# Patient Record
Sex: Female | Born: 1985 | Race: White | Hispanic: No | Smoking: Former smoker
Health system: Southern US, Community
[De-identification: ages and names within clinical notes are randomized; demographics above are authoritative.]

## PROBLEM LIST (undated history)

## (undated) DIAGNOSIS — I7300 Raynaud's syndrome without gangrene: Secondary | ICD-10-CM

## (undated) DIAGNOSIS — F329 Major depressive disorder, single episode, unspecified: Secondary | ICD-10-CM

## (undated) DIAGNOSIS — G43909 Migraine, unspecified, not intractable, without status migrainosus: Secondary | ICD-10-CM

## (undated) DIAGNOSIS — G5600 Carpal tunnel syndrome, unspecified upper limb: Secondary | ICD-10-CM

## (undated) DIAGNOSIS — M3500 Sicca syndrome, unspecified: Secondary | ICD-10-CM

## (undated) DIAGNOSIS — E063 Autoimmune thyroiditis: Secondary | ICD-10-CM

## (undated) DIAGNOSIS — A64 Unspecified sexually transmitted disease: Secondary | ICD-10-CM

## (undated) DIAGNOSIS — I890 Lymphedema, not elsewhere classified: Secondary | ICD-10-CM

## (undated) DIAGNOSIS — F419 Anxiety disorder, unspecified: Secondary | ICD-10-CM

## (undated) DIAGNOSIS — M4120 Other idiopathic scoliosis, site unspecified: Secondary | ICD-10-CM

## (undated) DIAGNOSIS — G4700 Insomnia, unspecified: Secondary | ICD-10-CM

## (undated) HISTORY — DX: Autoimmune thyroiditis: E06.3

## (undated) HISTORY — DX: Sjogren syndrome, unspecified: M35.00

## (undated) HISTORY — PX: CARPAL TUNNEL RELEASE: SHX101

## (undated) HISTORY — DX: Insomnia, unspecified: G47.00

## (undated) HISTORY — DX: Carpal tunnel syndrome, unspecified upper limb: G56.00

## (undated) HISTORY — DX: Other idiopathic scoliosis, site unspecified: M41.20

## (undated) HISTORY — DX: Lymphedema, not elsewhere classified: I89.0

## (undated) HISTORY — DX: Unspecified sexually transmitted disease: A64

## (undated) HISTORY — DX: Anxiety disorder, unspecified: F41.9

## (undated) HISTORY — DX: Raynaud's syndrome without gangrene: I73.00

## (undated) HISTORY — DX: Major depressive disorder, single episode, unspecified: F32.9

## (undated) HISTORY — DX: Migraine, unspecified, not intractable, without status migrainosus: G43.909

---
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
## 2007-01-25 ENCOUNTER — Ambulatory Visit: Payer: Self-pay | Admitting: Internal Medicine

## 2007-02-11 ENCOUNTER — Ambulatory Visit: Payer: Self-pay | Admitting: Internal Medicine

## 2008-03-25 ENCOUNTER — Other Ambulatory Visit: Admission: RE | Admit: 2008-03-25 | Discharge: 2008-03-25 | Payer: Self-pay | Admitting: Obstetrics and Gynecology

## 2008-06-10 ENCOUNTER — Ambulatory Visit: Payer: Self-pay | Admitting: Family Medicine

## 2008-06-10 DIAGNOSIS — F3289 Other specified depressive episodes: Secondary | ICD-10-CM

## 2008-06-10 DIAGNOSIS — G4700 Insomnia, unspecified: Secondary | ICD-10-CM

## 2008-06-10 DIAGNOSIS — F329 Major depressive disorder, single episode, unspecified: Secondary | ICD-10-CM

## 2008-06-10 DIAGNOSIS — N3000 Acute cystitis without hematuria: Secondary | ICD-10-CM | POA: Insufficient documentation

## 2008-06-10 DIAGNOSIS — R000 Tachycardia, unspecified: Secondary | ICD-10-CM

## 2008-06-10 DIAGNOSIS — M4120 Other idiopathic scoliosis, site unspecified: Secondary | ICD-10-CM | POA: Insufficient documentation

## 2008-06-10 DIAGNOSIS — R51 Headache: Secondary | ICD-10-CM

## 2008-06-10 DIAGNOSIS — R519 Headache, unspecified: Secondary | ICD-10-CM | POA: Insufficient documentation

## 2008-06-10 DIAGNOSIS — N3001 Acute cystitis with hematuria: Secondary | ICD-10-CM | POA: Insufficient documentation

## 2008-06-10 HISTORY — DX: Major depressive disorder, single episode, unspecified: F32.9

## 2008-06-10 HISTORY — DX: Other idiopathic scoliosis, site unspecified: M41.20

## 2008-06-10 HISTORY — DX: Insomnia, unspecified: G47.00

## 2008-06-10 HISTORY — DX: Other specified depressive episodes: F32.89

## 2008-06-10 LAB — CONVERTED CEMR LAB
Bilirubin Urine: NEGATIVE
Blood in Urine, dipstick: NEGATIVE
Glucose, Urine, Semiquant: NEGATIVE
Ketones, urine, test strip: NEGATIVE
Nitrite: NEGATIVE
Protein, U semiquant: NEGATIVE
Specific Gravity, Urine: >=1.03
Urobilinogen, UA: 0.2
WBC Urine, dipstick: NEGATIVE
pH: 6

## 2008-06-12 LAB — CONVERTED CEMR LAB
BUN: 10 mg/dL (ref 6–23)
Basophils Absolute: 0.1 10*3/uL (ref 0.0–0.1)
Basophils Relative: 0.8 % (ref 0.0–3.0)
CO2: 28 meq/L (ref 19–32)
Calcium: 9.6 mg/dL (ref 8.4–10.5)
Chloride: 110 meq/L (ref 96–112)
Creatinine, Ser: 0.7 mg/dL (ref 0.4–1.2)
Eosinophils Absolute: 0 10*3/uL (ref 0.0–0.7)
Eosinophils Relative: 0.6 % (ref 0.0–5.0)
GFR calc Af Amer: 137 mL/min
GFR calc non Af Amer: 113 mL/min
Glucose, Bld: 129 mg/dL — ABNORMAL HIGH (ref 70–99)
HCT: 38.1 % (ref 36.0–46.0)
Hemoglobin: 12.9 g/dL (ref 12.0–15.0)
Lymphocytes Relative: 40.5 % (ref 12.0–46.0)
MCHC: 33.8 g/dL (ref 30.0–36.0)
MCV: 90.9 fL (ref 78.0–100.0)
Monocytes Absolute: 0.5 10*3/uL (ref 0.1–1.0)
Monocytes Relative: 8.4 % (ref 3.0–12.0)
Neutro Abs: 3.2 10*3/uL (ref 1.4–7.7)
Neutrophils Relative %: 49.7 % (ref 43.0–77.0)
Platelets: 257 10*3/uL (ref 150–400)
Potassium: 4.4 meq/L (ref 3.5–5.1)
RBC: 4.19 M/uL (ref 3.87–5.11)
RDW: 14 % (ref 11.5–14.6)
Sodium: 144 meq/L (ref 135–145)
TSH: 0.59 microintl units/mL (ref 0.35–5.50)
WBC: 6.4 10*3/uL (ref 4.5–10.5)

## 2008-08-08 ENCOUNTER — Emergency Department: Payer: Self-pay | Admitting: Unknown Physician Specialty

## 2009-05-21 ENCOUNTER — Emergency Department: Payer: Self-pay | Admitting: Emergency Medicine

## 2009-09-15 ENCOUNTER — Observation Stay: Payer: Self-pay

## 2009-09-23 ENCOUNTER — Inpatient Hospital Stay: Payer: Self-pay | Admitting: Obstetrics & Gynecology

## 2009-11-05 HISTORY — PX: INTRAUTERINE DEVICE INSERTION: SHX323

## 2010-11-16 ENCOUNTER — Emergency Department (HOSPITAL_COMMUNITY)
Admission: EM | Admit: 2010-11-16 | Discharge: 2010-11-16 | Disposition: A | Discharge status: Home / Self Care | Payer: 59 | Attending: Emergency Medicine | Admitting: Emergency Medicine

## 2010-11-16 DIAGNOSIS — K5289 Other specified noninfective gastroenteritis and colitis: Secondary | ICD-10-CM | POA: Insufficient documentation

## 2010-11-16 DIAGNOSIS — R109 Unspecified abdominal pain: Secondary | ICD-10-CM | POA: Insufficient documentation

## 2010-11-16 DIAGNOSIS — F329 Major depressive disorder, single episode, unspecified: Secondary | ICD-10-CM | POA: Insufficient documentation

## 2010-11-16 DIAGNOSIS — F3289 Other specified depressive episodes: Secondary | ICD-10-CM | POA: Insufficient documentation

## 2010-11-16 DIAGNOSIS — R6883 Chills (without fever): Secondary | ICD-10-CM | POA: Insufficient documentation

## 2010-11-16 DIAGNOSIS — R112 Nausea with vomiting, unspecified: Secondary | ICD-10-CM | POA: Insufficient documentation

## 2010-11-16 DIAGNOSIS — R197 Diarrhea, unspecified: Secondary | ICD-10-CM | POA: Insufficient documentation

## 2010-11-28 ENCOUNTER — Encounter: Payer: Self-pay | Admitting: Family Medicine

## 2010-11-28 ENCOUNTER — Ambulatory Visit (INDEPENDENT_AMBULATORY_CARE_PROVIDER_SITE_OTHER): Payer: 59 | Admitting: Family Medicine

## 2010-11-28 DIAGNOSIS — G43909 Migraine, unspecified, not intractable, without status migrainosus: Secondary | ICD-10-CM

## 2010-11-28 DIAGNOSIS — R000 Tachycardia, unspecified: Secondary | ICD-10-CM

## 2010-11-28 DIAGNOSIS — G44209 Tension-type headache, unspecified, not intractable: Secondary | ICD-10-CM

## 2010-11-28 MED ORDER — SUMATRIPTAN SUCCINATE 100 MG PO TABS: 100.0000 mg | ORAL_TABLET | ORAL | Status: DC | PRN

## 2010-11-28 MED ORDER — NORTRIPTYLINE HCL 25 MG PO CAPS: 25.0000 mg | ORAL_CAPSULE | Freq: Every day | ORAL | Status: DC

## 2010-11-28 NOTE — Patient Instructions (Signed)
Migraine Headache   A migraine headache is an intense, throbbing pain on one or both sides of your head. The exact cause of a migraine headache is not always known. A migraine may be caused when nerves in the brain become irritated and release chemicals that cause swelling (inflammation) within blood vessels, causing pain. Many migraine sufferers have a family history of migraines. Before you get a migraine you may or may not get an aura. An aura is a group of symptoms that can predict the beginning of a migraine. An aura may include:   Visual changes such as:   Flashing lights.   Seeing bright spots or zig-zag lines.   Tunnel vision.   Feelings of numbness.   Trouble talking.   Muscle weakness.   SYMPTOMS OF A MIGRAINE   A migraine headache has one or more of the following symptoms:   Pain on one or both sides of your head.   Pain that is pulsating or throbbing in nature.   Pain that is severe enough to prevent daily activities.   Pain that is aggravated by any daily physical activity.   Nausea (feeling sick to your stomach), vomiting or both.   Pain with exposure to bright lights, loud noises or activity.   General sensitivity to bright lights or loud noises.   MIGRAINE TRIGGERS   A migraine headache can be triggered by many things. Examples of triggers include:   Alcohol.   Smoking.   Stress.   It may be related to menses (female menstruation).   Aged cheeses.   Foods or drinks that contain nitrates, glutamate, aspartame or tyramine.   Lack of sleep.   Chocolate.   Caffeine.   Hunger.   Medications such as nitroglycerine (used to treat chest pain), birth control pills, estrogen and some blood pressure medications.   DIAGNOSIS   A migraine headache is often diagnosed based on:   Your symptoms.   Physical examination.   A CT scan of your head may be ordered to see if your headaches are caused from other medical conditions.   HOME CARE INSTRUCTIONS   Medications can help prevent migraines if they are recurrent or  should they become recurrent. Your caregiver can help you with a medication or treatment program that will be helpful to you.   If you get a migraine, it may be helpful to lie down in a dark, quiet room.   It may be helpful to keep a headache diary. This may help you find a trend as to what may be triggering your headaches.   SEEK IMMEDIATE MEDICAL CARE IF:   You do not get relief from the medications given to you or you have a recurrence of pain.   You have confusion, personality changes or seizures.   You have headaches that wake you from sleep.   You have an increased frequency in your headaches.   You have a stiff neck.   You have a loss of vision.   You have muscle weakness.   You start losing your balance or have trouble walking.   You feel faint or pass out.   MAKE SURE YOU:   Understand these instructions.   Will watch your condition.   Will get help right away if you are not doing well or get worse.   Document Released: 09/11/2005 Document Re-Released: 07/09/2009   [employer]® Patient Information ©2011 [employer].

## 2010-11-28 NOTE — Progress Notes (Signed)
  Subjective:    Patient ID: Nichole Mcbride, female    DOB: Dec 02, 1985, 25 y.o.   MRN: [id number]  HPI Patient seen with chief complaint of headaches. She has history of both migraines which have become more frequent recently and daily type headache which she attributes to tension headache. Long history of migraines. Her migraines are usually unilateral , throbbing associated with nausea and vomiting and occur about once per week. Her daily headaches which she's had over the past month and are usually bilateral and bandlike with associated tension in the neck and upper back.   Works third shift. 31-month-old son so she gets very little sleep, usually 3 hours per night. No significant caffeine use. Denies any recent fever , sinusitis symptoms , focal weakness , or any other focal neurologic concerns.   Review of Systems  Constitutional: Negative for fever, chills, appetite change and unexpected weight change.  HENT: Negative for nosebleeds, sore throat, rhinorrhea and sinus pressure.   Eyes: Negative for visual disturbance.  Respiratory: Negative for cough.   Cardiovascular: Negative for chest pain.  Skin: Negative for rash.  Neurological: Positive for headaches. Negative for seizures and syncope.  Hematological: Negative for adenopathy.  Psychiatric/Behavioral: Negative for confusion.       Objective:   Physical Exam  patient is alert nontoxic in appearance. Nasal exam is unremarkable Eardrums normal Oropharynx is clear Neck supple no adenopathy or thyromegaly Chest clear to auscultation Heart regular rhythm and rate no murmur Neuro exam cranial nerves II through XII are normal. Strength is full throughout. Normal cerebellar function.       Assessment & Plan:   #1 migraine headaches with increasing frequency probably related to poor sleep  #2 chronic daily tension-type headaches again probably related to poor sleep and increased stress   discuss acute headache treatment as well  as prevention. Work on more consistent sleep-difficult with her life stresses. Imitrex 100 mg when necessary for migraine headache. Given frequency of migraine headaches and new onset of daily tension-type headaches initiate low-dose Pamelor 25 mg each bedtime and reviewed possible side effects. followup with primary in one month to reassess.  Consider other possible migraine preventive meds such as Topamax but she has mixed migraine and tension type.

## 2010-12-11 ENCOUNTER — Emergency Department (HOSPITAL_COMMUNITY)
Admission: EM | Admit: 2010-12-11 | Discharge: 2010-12-11 | Disposition: A | Discharge status: Home / Self Care | Payer: 59 | Attending: Emergency Medicine | Admitting: Emergency Medicine

## 2010-12-11 DIAGNOSIS — F3289 Other specified depressive episodes: Secondary | ICD-10-CM | POA: Insufficient documentation

## 2010-12-11 DIAGNOSIS — R197 Diarrhea, unspecified: Secondary | ICD-10-CM | POA: Insufficient documentation

## 2010-12-11 DIAGNOSIS — F329 Major depressive disorder, single episode, unspecified: Secondary | ICD-10-CM | POA: Insufficient documentation

## 2010-12-11 DIAGNOSIS — R5381 Other malaise: Secondary | ICD-10-CM | POA: Insufficient documentation

## 2010-12-11 DIAGNOSIS — R42 Dizziness and giddiness: Secondary | ICD-10-CM | POA: Insufficient documentation

## 2010-12-11 DIAGNOSIS — R112 Nausea with vomiting, unspecified: Secondary | ICD-10-CM | POA: Insufficient documentation

## 2010-12-11 LAB — CBC
HCT: 40.3 % (ref 36.0–46.0)
Hemoglobin: 13.3 g/dL (ref 12.0–15.0)
MCH: 30.2 pg (ref 26.0–34.0)
MCHC: 33 g/dL (ref 30.0–36.0)
MCV: 91.6 fL (ref 78.0–100.0)
RBC: 4.4 MIL/uL (ref 3.87–5.11)

## 2010-12-11 LAB — COMPREHENSIVE METABOLIC PANEL
Alkaline Phosphatase: 60 U/L (ref 39–117)
BUN: 15 mg/dL (ref 6–23)
Calcium: 9 mg/dL (ref 8.4–10.5)
Creatinine, Ser: 0.75 mg/dL (ref 0.4–1.2)
Glucose, Bld: 120 mg/dL — ABNORMAL HIGH (ref 70–99)
Potassium: 5 mEq/L (ref 3.5–5.1)
Total Protein: 7.3 g/dL (ref 6.0–8.3)

## 2010-12-11 LAB — URINALYSIS, ROUTINE W REFLEX MICROSCOPIC
Bilirubin Urine: NEGATIVE
Ketones, ur: 40 mg/dL — AB
Nitrite: NEGATIVE
Protein, ur: NEGATIVE mg/dL
Urobilinogen, UA: 0.2 mg/dL (ref 0.0–1.0)

## 2010-12-11 LAB — DIFFERENTIAL
Basophils Relative: 0 % (ref 0–1)
Lymphs Abs: 1 10*3/uL (ref 0.7–4.0)
Monocytes Absolute: 0.5 10*3/uL (ref 0.1–1.0)
Monocytes Relative: 3 % (ref 3–12)
Neutro Abs: 14.9 10*3/uL — ABNORMAL HIGH (ref 1.7–7.7)
Neutrophils Relative %: 91 % — ABNORMAL HIGH (ref 43–77)

## 2010-12-11 LAB — LIPASE, BLOOD: Lipase: 19 U/L (ref 11–59)

## 2010-12-11 LAB — PREGNANCY, URINE: Preg Test, Ur: NEGATIVE

## 2010-12-12 LAB — GLUCOSE, CAPILLARY

## 2011-01-02 ENCOUNTER — Ambulatory Visit: Payer: 59 | Admitting: Family Medicine

## 2011-09-20 ENCOUNTER — Encounter: Payer: Self-pay | Admitting: Family Medicine

## 2011-09-20 ENCOUNTER — Ambulatory Visit (INDEPENDENT_AMBULATORY_CARE_PROVIDER_SITE_OTHER): Payer: 59 | Admitting: Family Medicine

## 2011-09-20 VITALS — BP 110/70 | HR 90 | Temp 98.5°F | Ht 65.0 in | Wt 133.4 lb

## 2011-09-20 DIAGNOSIS — J0301 Acute recurrent streptococcal tonsillitis: Secondary | ICD-10-CM

## 2011-09-20 DIAGNOSIS — I7300 Raynaud's syndrome without gangrene: Secondary | ICD-10-CM

## 2011-09-20 DIAGNOSIS — J020 Streptococcal pharyngitis: Secondary | ICD-10-CM

## 2011-09-20 DIAGNOSIS — R5383 Other fatigue: Secondary | ICD-10-CM

## 2011-09-20 DIAGNOSIS — R5381 Other malaise: Secondary | ICD-10-CM

## 2011-09-20 DIAGNOSIS — T148XXA Other injury of unspecified body region, initial encounter: Secondary | ICD-10-CM

## 2011-09-20 DIAGNOSIS — G43009 Migraine without aura, not intractable, without status migrainosus: Secondary | ICD-10-CM

## 2011-09-20 MED ORDER — PROPRANOLOL HCL ER 80 MG PO CP24: 80.0000 mg | ORAL_CAPSULE | Freq: Every day | ORAL | Status: DC

## 2011-09-20 NOTE — Progress Notes (Signed)
Patient Name: Nichole Mcbride Date of Birth: 08-02-1986 Age: 25 y.o. Medical Record Number: [id number] Gender: female Date of Encounter: 09/20/2011  History of Present Illness:  Nichole Mcbride is a 25 y.o. very pleasant female patient who presents with the following:  Wanted a new MD closer to home.   Headaches -- intermittent migraine headaches. She has been having these for more than 10 years. She has been seeing the headache clinic in the past. She's been tried on a variety of medications and none of these have significantly helped. Bad reaction to imitrex.  Does not eat fruit. Did not eat any meat until pregnancy with son.  Eats bread and cheese.  She has done HA diaries in the past and tried to eliminate multiple things in her diet  Was going to headache / [facility] center. Was on one medication, not sure what it was. Tried eliminating things from diet.   gets strep throat --- has been getting strep throat every 1-2 months Per report and her boyfriend's report, the patient has been having acute streptococcal pharyngitis at least once a month or every one to 2 months over the last several years. These are all strep test proven, and she has been treated with penicillin or amoxicillin.  Lately has been getting a lot more numbness in hands, but not turning white.   Always feels tired.  Headaces have gotten worse over the last year.   A couple weeks back -- ? Lupus and thyroid.  She does have mmultiple joint complaints. Her mother does have lupus, and she does have Raynaud's phenomenon. Her knowledge, she has never had a full rheumatological workup.  Bruises easily  Past Medical History, Surgical History, Social History, Family History, Problem List, Medications, and Allergies have been reviewed and updated if relevant.  Review of Systems: Multiple complaints as above. Recent strep throat. Joint aches. Fatigue. Bruising. Headaches. No nausea, vomiting, or diarrhea. The patient sleeps  approximately 3-5 hours a night, and works on 3rd shift. Denies depression. Otherwise, the pertinent positives and negatives are listed above and in the HPI, otherwise a full review of systems has been reviewed and is negative unless noted positive.   Physical Examination: Filed Vitals:   09/20/11 1353  BP: 110/70  Pulse: 90  Temp: 98.5 F (36.9 C)  TempSrc: Oral  Height: 5\' 5"  (1.651 m)  Weight: 133 lb 6.4 oz (60.51 kg)  SpO2: 100%    Body mass index is 22.20 kg/(m^2).   GEN: WDWN, NAD, Non-toxic, A & O x 3 HEENT: Atraumatic, Normocephalic. Neck supple. No masses, No LAD. Ears and Nose: No external deformity. CV: RRR, No M/G/R. No JVD. No thrill. No extra heart sounds. PULM: CTA B, no wheezes, crackles, rhonchi. No retractions. No resp. distress. No accessory muscle use. EXTR: No c/c/e MSK: full range of motion in all joints. No dramatic tenosynovitis. NEURO Normal gait.  PSYCH: Normally interactive. Conversant. Not depressed or anxious appearing.  Calm demeanor.    Assessment and Plan: 1. Acute recurrent streptococcal tonsillitis  Ambulatory referral to ENT, Anti-Smith antibody, Cyclic citrul peptide antibody, IgG  2. Raynaud's disease  Basic metabolic panel, CBC with Differential, High sensitivity CRP, Sedimentation rate, Rheumatoid factor, ANA, TSH, Vitamin B12, Anti-DNA antibody, double-stranded, Extractable Nuclear antigen ab, Anti-Smith antibody, Cyclic citrul peptide antibody, IgG  3. Fatigue  Basic metabolic panel, CBC with Differential, High sensitivity CRP, Sedimentation rate, Rheumatoid factor, ANA, TSH, Vitamin B12, Anti-DNA antibody, double-stranded, Extractable Nuclear antigen ab, Anti-Smith antibody, Cyclic citrul  peptide antibody, IgG  4. Bruising  Basic metabolic panel, CBC with Differential, High sensitivity CRP, Sedimentation rate, Rheumatoid factor, ANA, TSH, Vitamin B12, Anti-DNA antibody, double-stranded, Extractable Nuclear antigen ab, Anti-Smith antibody,  Cyclic citrul peptide antibody, IgG  5. Migraine headache without aura  propranolol (INDERAL LA) 80 MG 24 hr capsule, Anti-Smith antibody, Cyclic citrul peptide antibody, IgG    Recurrent streptococcal infections, anywhere from 6-12 times a year. Consult ENT. Clinical question is whether or not tonsillectomy as appropriate.  Multiple joint complaints in the setting of Raynaud's disease and a family history of lupus. Check full rheumatological workup as above.  Migraine headaches, recalcitrant. Initiate beta blockade, and recheck in one month.  >40 minutes spent in face to face time with patient, >50% spent in counselling or coordination of care: much of time spent discussing management of recurrent streptococcal infections, consideration of ENT consult, migraine headache management, history, eating, and also a discussion regarding rheumatological diseases in general and discussion of Raynaud's disease as well as lupus.

## 2011-09-20 NOTE — Patient Instructions (Signed)
F/u 4-5 weeks 

## 2011-09-21 LAB — CBC WITH DIFFERENTIAL/PLATELET
Basos: 1 % (ref 0–3)
Eos: 1 % (ref 0–7)
HCT: 43.2 % (ref 34.0–46.6)
Immature Grans (Abs): 0 10*3/uL (ref 0.0–0.1)
Immature Granulocytes: 0 % (ref 0–2)
Lymphocytes Absolute: 2.5 10*3/uL (ref 0.7–4.5)
MCV: 91 fL (ref 79–97)
Monocytes Absolute: 0.5 10*3/uL (ref 0.1–1.0)
Monocytes: 8 % (ref 4–13)
RBC: 4.73 x10E6/uL (ref 3.77–5.28)
RDW: 12.8 % (ref 12.3–15.4)
WBC: 6.4 10*3/uL (ref 4.0–10.5)

## 2011-09-21 LAB — SEDIMENTATION RATE: Sed Rate: 2 mm/hr (ref 0–32)

## 2011-09-21 LAB — BASIC METABOLIC PANEL
BUN: 11 mg/dL (ref 6–20)
Calcium: 10.2 mg/dL (ref 8.7–10.2)
Creatinine, Ser: 0.72 mg/dL (ref 0.57–1.00)
GFR calc Af Amer: 137 mL/min/{1.73_m2} (ref >59)
GFR calc non Af Amer: 118 mL/min/{1.73_m2} (ref >59)
Glucose: 89 mg/dL (ref 65–99)
Potassium: 4.4 mmol/L (ref 3.5–5.2)

## 2011-09-21 LAB — RHEUMATOID FACTOR: Rhuematoid fact SerPl-aCnc: 7.9 IU/mL (ref 0.0–13.9)

## 2011-09-21 LAB — VITAMIN B12: Vitamin B-12: 248 pg/mL (ref 211–946)

## 2011-09-21 LAB — ANA: Anti Nuclear Antibody(ANA): POSITIVE — AB

## 2011-09-25 ENCOUNTER — Encounter: Payer: Self-pay | Admitting: Family Medicine

## 2011-09-29 ENCOUNTER — Telehealth: Payer: Self-pay | Admitting: Family Medicine

## 2011-09-29 NOTE — Telephone Encounter (Signed)
Dr. Patsy Lager to handle.

## 2011-09-29 NOTE — Telephone Encounter (Signed)
Requesting lab results.  Please call patient with results.

## 2011-09-29 NOTE — Telephone Encounter (Signed)
Called and left her a message.  ANA is positive - all others neg. Smith AB still pending.  Sophisticated patient who works at [employer] - I will detail with her at her follow-up and will want to do more specific lupus testing.

## 2011-10-02 ENCOUNTER — Telehealth: Payer: Self-pay | Admitting: *Deleted

## 2011-10-02 NOTE — Telephone Encounter (Signed)
Advised patient of results she wants to come in next week b/c she had to stop taken the inderal

## 2011-10-02 NOTE — Telephone Encounter (Signed)
Patient called stating that she left messages on Friday requesting her lab results and did not hear back from anyone regarding this.  It appears that patient's telephone number was incorrect on the chart. Please call patient back at [phone].

## 2011-10-02 NOTE — Telephone Encounter (Signed)
Please see lab note from Dr. Patsy Lager.. Call pt  to let pt know his comments.

## 2011-10-11 ENCOUNTER — Ambulatory Visit (INDEPENDENT_AMBULATORY_CARE_PROVIDER_SITE_OTHER): Payer: 59 | Admitting: Family Medicine

## 2011-10-11 ENCOUNTER — Encounter: Payer: Self-pay | Admitting: Family Medicine

## 2011-10-11 VITALS — BP 100/60 | HR 74 | Temp 98.6°F | Ht 65.0 in | Wt 133.8 lb

## 2011-10-11 DIAGNOSIS — I7300 Raynaud's syndrome without gangrene: Secondary | ICD-10-CM

## 2011-10-11 DIAGNOSIS — G43909 Migraine, unspecified, not intractable, without status migrainosus: Secondary | ICD-10-CM

## 2011-10-11 DIAGNOSIS — Z8269 Family history of other diseases of the musculoskeletal system and connective tissue: Secondary | ICD-10-CM

## 2011-10-11 DIAGNOSIS — Z840 Family history of diseases of the skin and subcutaneous tissue: Secondary | ICD-10-CM

## 2011-10-11 DIAGNOSIS — R768 Other specified abnormal immunological findings in serum: Secondary | ICD-10-CM

## 2011-10-11 DIAGNOSIS — R894 Abnormal immunological findings in specimens from other organs, systems and tissues: Secondary | ICD-10-CM

## 2011-10-11 MED ORDER — AMITRIPTYLINE HCL 25 MG PO TABS: 25.0000 mg | ORAL_TABLET | Freq: Every day | ORAL | Status: DC

## 2011-10-11 NOTE — Patient Instructions (Signed)
1 tablet before bed, about 20-30 mins. After 1 month, increase to 2 tablets at bed.  Recheck 2 months  REFERRAL: GO THE THE FRONT ROOM AT THE ENTRANCE OF OUR CLINIC, NEAR CHECK IN. ASK FOR MARION. SHE WILL HELP YOU SET UP YOUR REFERRAL. DATE: TIME:

## 2011-10-11 NOTE — Progress Notes (Signed)
  Patient Name: Nichole Mcbride Date of Birth: 1986-08-28 Medical Record Number: [id number] Gender: female Date of Encounter: 10/11/2011  History of Present Illness:  Nichole Mcbride is a 26 y.o. very pleasant female patient who presents with the following:  Migraine headache, follow. Unable to tolerate propranolol. She had stopped this after 2-3 days. She has been unable to tolerate the trip in class of medications. She has had some headaches approximately 2 times a week in the interval time.  Sleep is still with difficulty. She does have a 62-year-old child.  All laboratories were reviewed with the patient. The patient did have a positive ANA. She does have Raynaud's disease. Sedimentation rate and CRP are negative.  Rheumatoid factor is negative. Anti-Smith antibodies are negative. ENA is negative.  Patient Active Problem List  Diagnoses  . DEPRESSION  . SCOLIOSIS  . Raynaud's disease  . Migraine headache   Past Medical History  Diagnosis Date  . DEPRESSION 06/10/2008  . INSOMNIA 06/10/2008  . SCOLIOSIS 06/10/2008  . Migraine headache   . Raynaud phenomenon    No past surgical history on file. History  Substance Use Topics  . Smoking status: Never Smoker   . Smokeless tobacco: Not on file  . Alcohol Use: Not on file   No family history on file. Allergies  Allergen Reactions  . Imitrex (Sumatriptan Succinate)     Medication list has been reviewed and updated.  Review of Systems: Continued headaches. No rashes. Occasional arthralgias persist. Some difficulty sleeping. History of depression, but not active.  Physical Examination: Filed Vitals:   10/11/11 0944  BP: 100/60  Pulse: 74  Temp: 98.6 F (37 C)  TempSrc: Oral  Height: 5\' 5"  (1.651 m)  Weight: 133 lb 12.8 oz (60.691 kg)  SpO2: 99%    Body mass index is 22.27 kg/(m^2).   GEN: WDWN, NAD, Non-toxic, A & O x 3 HEENT: Atraumatic, Normocephalic. Neck supple. No masses, No LAD. Ears and Nose: No external  deformity. CV: RRR, No M/G/R. No JVD. No thrill. No extra heart sounds. PULM: CTA B, no wheezes, crackles, rhonchi. No retractions. No resp. distress. No accessory muscle use. EXTR: No c/c/e NEURO Normal gait.  PSYCH: Normally interactive. Conversant. Not depressed or anxious appearing.  Calm demeanor.    Assessment and Plan: 1. Migraine headache    2. Positive ANA (antinuclear antibody)  Ambulatory referral to Rheumatology  3. Raynaud disease  Ambulatory referral to Rheumatology  4. Family history of systemic lupus erythematosus  Ambulatory referral to Rheumatology    Migraines, start amitriptyline prior to bedtime.  Mother with systemic lupus, positive ANA. Unfortunately, the [facility] seems to have not done a titration for this ANA. With a positive family history in her mother with systemic lupus, Raynaud's disease, and a positive ANA, I think it is prudent to have a rheumatological evaluation to define yes / no if SLE or other connective tissue disease

## 2011-10-14 ENCOUNTER — Encounter: Payer: Self-pay | Admitting: Family Medicine

## 2011-10-14 DIAGNOSIS — G43909 Migraine, unspecified, not intractable, without status migrainosus: Secondary | ICD-10-CM | POA: Insufficient documentation

## 2016-03-23 ENCOUNTER — Ambulatory Visit (INDEPENDENT_AMBULATORY_CARE_PROVIDER_SITE_OTHER): Admitting: Primary Care

## 2016-03-23 ENCOUNTER — Encounter: Payer: Self-pay | Admitting: Primary Care

## 2016-03-23 VITALS — BP 116/68 | HR 74 | Temp 98.0°F | Ht 65.0 in | Wt 186.0 lb

## 2016-03-23 DIAGNOSIS — F418 Other specified anxiety disorders: Secondary | ICD-10-CM

## 2016-03-23 DIAGNOSIS — M3500 Sicca syndrome, unspecified: Secondary | ICD-10-CM

## 2016-03-23 DIAGNOSIS — F329 Major depressive disorder, single episode, unspecified: Secondary | ICD-10-CM

## 2016-03-23 DIAGNOSIS — M797 Fibromyalgia: Secondary | ICD-10-CM

## 2016-03-23 DIAGNOSIS — F419 Anxiety disorder, unspecified: Principal | ICD-10-CM

## 2016-03-23 DIAGNOSIS — G43901 Migraine, unspecified, not intractable, with status migrainosus: Secondary | ICD-10-CM

## 2016-03-23 DIAGNOSIS — F32A Depression, unspecified: Secondary | ICD-10-CM | POA: Insufficient documentation

## 2016-03-23 MED ORDER — DULOXETINE HCL 30 MG PO CPEP: 30.0000 mg | ORAL_CAPSULE | Freq: Every evening | ORAL | Status: DC

## 2016-03-23 MED ORDER — BUPROPION HCL 100 MG PO TABS: 100.0000 mg | ORAL_TABLET | Freq: Every day | ORAL | Status: DC

## 2016-03-23 NOTE — Progress Notes (Signed)
   Subjective:    Patient ID: Nichole Mcbride, female    DOB: 01/26/1986, 30 y.o.   MRN: [id number]  HPI  [name] is a 30 year old female who presents today to establish care and discuss the problems mentioned below. Will obtain old records.  1) Sjogrens/Fibromyalgia: Diagnosed 4-5 years ago. She currently follows with [facility]. She experiences discomfort to lower back, hands, hips, legs, photosensitivity, dry eyes. She's had a positive ANA, Sjogren's antibodies, and ESR. She is currently not on any medication and will take ibuprofen for her discomfort without improvement.   2) Anxiety and Depression: Diagnosed at age 19. She is currently managed on Wellbutrin 100 mg in the morning and Effexor XR 150 mg at bedtime. She recently visited with her rheumatologist who recommended she try Cymbalta for presumed fibromyalgia pain. She has a history of panic attacks which are infrequent. She is not followed by psychiatry. Denies SI/HI.  Review of Systems  Respiratory: Negative for shortness of breath.   Cardiovascular: Negative for chest pain.  Musculoskeletal: Positive for arthralgias. Negative for joint swelling.  Neurological: Negative for dizziness and headaches.  Psychiatric/Behavioral: Negative for suicidal ideas and sleep disturbance. The patient is not nervous/anxious.        Past Medical History  Diagnosis Date  . DEPRESSION 06/10/2008  . INSOMNIA 06/10/2008  . SCOLIOSIS 06/10/2008  . Migraine headache   . Raynaud phenomenon   . Sjogren's disease (HCC)      Social History   Social History  . Marital Status: Married    Spouse Name: N/A  . Number of Children: N/A  . Years of Education: N/A   Occupational History  . Not on file.   Social History Main Topics  . Smoking status: Never Smoker   . Smokeless tobacco: Not on file  . Alcohol Use: Not on file  . Drug Use: Not on file  . Sexual Activity: Not on file   Other Topics Concern  . Not on  file   Social History Narrative   Married.   2 children.   Teaches English.   Enjoys spending time with family.    No past surgical history on file.  Family History  Problem Relation Age of Onset  . Diabetes Mother   . Hypertension Mother   . Hypertension Father   . Hypothyroidism Father   . Hypothyroidism Mother     Allergies  Allergen Reactions  . Imitrex [Sumatriptan Succinate]     No current outpatient prescriptions on file prior to visit.   No current facility-administered medications on file prior to visit.    BP 116/68 mmHg  Pulse 74  Temp(Src) 98 F (36.7 C) (Oral)  Ht 5' 5" (1.651 m)  Wt 186 lb (84.369 kg)  BMI 30.95 kg/m2  SpO2 98%  LMP 03/18/2016 (Within Days)    Objective:   Physical Exam  Constitutional: She appears well-nourished.  Neck: Neck supple.  Cardiovascular: Normal rate and regular rhythm.   Pulmonary/Chest: Effort normal and breath sounds normal.  Musculoskeletal:  Good range of motion.  Skin: Skin is warm and dry.  Psychiatric: She has a normal mood and affect.          Assessment & Plan:   

## 2016-03-23 NOTE — Assessment & Plan Note (Signed)
Diagnosis suggested per rheumatology. Will switch Effexor to Cymbalta to aid with fiber myalgia discomfort.

## 2016-03-23 NOTE — Assessment & Plan Note (Signed)
Once managed on amitriptyline 25 mg at bedtime. Overall feels managed off medication at this time.

## 2016-03-23 NOTE — Patient Instructions (Signed)
Stop Effexor. Start Cymbalta. This is used for depression and fibromyalgia discomfort.  Continue Wellbutrin as prescribed.  Follow up in 6 weeks for complete physical and re-evaluation.  It was a pleasure to meet you today! Please don't hesitate to call me with any questions. Welcome to [facility]!    .Marland Kitchen

## 2016-03-23 NOTE — Progress Notes (Signed)
Pre visit review using our clinic review tool, if applicable. No additional management support is needed unless otherwise documented below in the visit note. 

## 2016-03-23 NOTE — Assessment & Plan Note (Signed)
Diagnosed 4-5 years ago. Follows with rheumatology. Does experience photosensitivity and dry eyes and is requesting a tinted windshield. Form signed as she will need this due to condition.

## 2016-03-23 NOTE — Assessment & Plan Note (Addendum)
Diagnosed at age 30, well managed on Wellbutrin and Effexor. Switch Effexor to Cymbalta due to fibromyalgia pain. Follow-up in 6 weeks for reevaluation.

## 2016-05-04 ENCOUNTER — Ambulatory Visit (INDEPENDENT_AMBULATORY_CARE_PROVIDER_SITE_OTHER): Admitting: Primary Care

## 2016-05-04 ENCOUNTER — Encounter: Payer: Self-pay | Admitting: Primary Care

## 2016-05-04 VITALS — BP 114/72 | HR 81 | Temp 98.2°F | Ht 65.0 in | Wt 191.0 lb

## 2016-05-04 DIAGNOSIS — Z0000 Encounter for general adult medical examination without abnormal findings: Secondary | ICD-10-CM

## 2016-05-04 DIAGNOSIS — F418 Other specified anxiety disorders: Secondary | ICD-10-CM | POA: Diagnosis not present

## 2016-05-04 DIAGNOSIS — M797 Fibromyalgia: Secondary | ICD-10-CM | POA: Diagnosis not present

## 2016-05-04 DIAGNOSIS — G43901 Migraine, unspecified, not intractable, with status migrainosus: Secondary | ICD-10-CM

## 2016-05-04 DIAGNOSIS — Z0001 Encounter for general adult medical examination with abnormal findings: Secondary | ICD-10-CM | POA: Insufficient documentation

## 2016-05-04 DIAGNOSIS — F329 Major depressive disorder, single episode, unspecified: Secondary | ICD-10-CM

## 2016-05-04 DIAGNOSIS — F419 Anxiety disorder, unspecified: Secondary | ICD-10-CM

## 2016-05-04 DIAGNOSIS — R519 Headache, unspecified: Secondary | ICD-10-CM

## 2016-05-04 DIAGNOSIS — F32A Depression, unspecified: Secondary | ICD-10-CM

## 2016-05-04 DIAGNOSIS — R51 Headache: Secondary | ICD-10-CM

## 2016-05-04 LAB — COMPREHENSIVE METABOLIC PANEL
ALBUMIN: 4.4 g/dL (ref 3.5–5.2)
ALT: 12 U/L (ref 0–35)
AST: 19 U/L (ref 0–37)
Alkaline Phosphatase: 65 U/L (ref 39–117)
BUN: 10 mg/dL (ref 6–23)
CALCIUM: 9.4 mg/dL (ref 8.4–10.5)
CHLORIDE: 102 meq/L (ref 96–112)
CO2: 30 mEq/L (ref 19–32)
CREATININE: 0.78 mg/dL (ref 0.40–1.20)
GFR: 93.23 mL/min (ref >60.00)
Glucose, Bld: 100 mg/dL — ABNORMAL HIGH (ref 70–99)
POTASSIUM: 4.1 meq/L (ref 3.5–5.1)
Sodium: 136 mEq/L (ref 135–145)
Total Bilirubin: 0.4 mg/dL (ref 0.2–1.2)
Total Protein: 7.4 g/dL (ref 6.0–8.3)

## 2016-05-04 LAB — LIPID PANEL
CHOLESTEROL: 192 mg/dL (ref 0–200)
HDL: 58.3 mg/dL (ref >39.00)
LDL CALC: 114 mg/dL — AB (ref 0–99)
NonHDL: 133.89
TRIGLYCERIDES: 98 mg/dL (ref 0.0–149.0)
Total CHOL/HDL Ratio: 3
VLDL: 19.6 mg/dL (ref 0.0–40.0)

## 2016-05-04 MED ORDER — DULOXETINE HCL 60 MG PO CPEP: 60.0000 mg | ORAL_CAPSULE | Freq: Every day | ORAL | 0 refills | Status: DC

## 2016-05-04 MED ORDER — TOPIRAMATE 25 MG PO TABS: 25.0000 mg | ORAL_TABLET | Freq: Every day | ORAL | 2 refills | Status: DC

## 2016-05-04 NOTE — Assessment & Plan Note (Addendum)
Immunizations up-to-date per patient. Pap completed 2 years ago, due next year. Continues to experience frequent headaches, initiated preventative treatment. Depression doesn't seem well managed at this point, and inrcreased dose of Cymbalta. Exam otherwise unremarkable. Labs pending. Follow-up in one year for repeat physical. Follow-up in 6 weeks for reevaluation of depression and headaches.

## 2016-05-04 NOTE — Progress Notes (Signed)
Subjective:    Patient ID: Nichole Mcbride, female    DOB: [birth year], 30 y.o.   MRN: [id number]  HPI  [name] is a 30 year old female who presents today for complete physical.  Immunizations: -Tetanus: Unsure, believes it's been within 10 years. -Influenza: Did receive last season.  Diet: She endorses a fair diet. Breakfast: Eggs, pancakes, waffles Lunch: Sandwiches Dinner: Chicken pie, chicken, vegetables, rice Snacks: None Desserts: 1-2 times weekly Beverages: Water  Exercise: She is walking some few days weekly. Eye exam: November 2016, no changes. Dental exam: Completes semi-annually. Pap Smear: Completed 2 years ago.   Review of Systems  Constitutional: Negative for unexpected weight change.  HENT: Negative for rhinorrhea.   Respiratory: Negative for cough and shortness of breath.   Cardiovascular: Negative for chest pain.  Gastrointestinal: Negative for constipation and diarrhea.  Genitourinary: Negative for difficulty urinating and menstrual problem.  Musculoskeletal: Positive for arthralgias. Negative for myalgias.       Fibromyalgia pain  Skin: Negative for rash.  Allergic/Immunologic: Negative for environmental allergies.  Neurological: Negative for dizziness, numbness and headaches.       Continues to experience headaches daily, migraines 2-3 times weekly. Interested in prophylactic treatment.  Psychiatric/Behavioral:       Overall feels as though she's battling with depression. She's experiencing increased depression, sadness, tearfulness. Denies SI/HI.        Past Medical History:  Diagnosis Date  . DEPRESSION 06/10/2008  . INSOMNIA 06/10/2008  . Migraine headache   . Raynaud phenomenon   . SCOLIOSIS 06/10/2008  . Sjogren's disease [facility])      Social History   Social History  . Marital status: Married    Spouse name: N/A  . Number of children: N/A  . Years of education: N/A   Occupational History  . Not on file.   Social History  Main Topics  . Smoking status: Never Smoker  . Smokeless tobacco: Not on file  . Alcohol use Not on file  . Drug use: Unknown  . Sexual activity: Not on file   Other Topics Concern  . Not on file   Social History Narrative   Married.   2 children.   Teaches English.   Enjoys spending time with family.    No past surgical history on file.  Family History  Problem Relation Age of Onset  . Diabetes Mother   . Hypertension Mother   . Hypertension Father   . Hypothyroidism Father   . Hypothyroidism Mother     Allergies  Allergen Reactions  . Imitrex [Sumatriptan Succinate]     Current Outpatient Prescriptions on File Prior to Visit  Medication Sig Dispense Refill  . buPROPion (WELLBUTRIN) 100 MG tablet Take 1 tablet (100 mg total) by mouth daily. 90 tablet 2   No current facility-administered medications on file prior to visit.     BP 114/72   Pulse 81   Temp 98.2 F (36.8 C) (Oral)   Ht  (1.651 m)   Wt 191 lb (86.6 kg)   LMP 04/20/2016 (Within Days)   BMI 31.78 kg/m    Objective:   Physical Exam  Constitutional: She is oriented to person, place, and time. She appears well-nourished.  HENT:  Right Ear: Tympanic membrane and ear canal normal.  Left Ear: Tympanic membrane and ear canal normal.  Nose: Nose normal.  Mouth/Throat: Oropharynx is clear and moist.  Eyes: Conjunctivae and EOM are normal. Pupils are equal, round, and reactive to  light.  Neck: Neck supple. No thyromegaly present.  Cardiovascular: Normal rate and regular rhythm.   No murmur heard. Pulmonary/Chest: Effort normal and breath sounds normal. She has no rales.  Abdominal: Soft. Bowel sounds are normal. There is no tenderness.  Musculoskeletal: Normal range of motion.  Lymphadenopathy:    She has no cervical adenopathy.  Neurological: She is alert and oriented to person, place, and time. She has normal reflexes. No cranial nerve deficit.  Skin: Skin is warm and dry. No rash noted.    Psychiatric: She has a normal mood and affect.          Assessment & Plan:

## 2016-05-04 NOTE — Patient Instructions (Signed)
We've increased the dose of your Cymbalta from 30 mg to 60 mg. You may take 2 of the 30 mg capsules for now until your current bottle is empty. I sent a new prescription for 60 mg to your pharmacy.  Start Topamax tablets for headache prevention. Take 1 tablet by mouth every night at bedtime.  Continue exercising. You should be getting 1 hour of moderate intensity exercise 5 days weekly.  Ensure you are consuming 64 ounces of water daily.  Follow up in 6 weeks for re-evaluation of headaches and depression.  It was a pleasure to see you today!

## 2016-05-04 NOTE — Assessment & Plan Note (Signed)
Headaches daily, migraines 2-3 times weekly. She would like to try preventative medication for headaches. Discussed options and did not feel well in the past on amitriptyline. Will start low-dose Topamax and have her follow-up 6 weeks for further evaluation.

## 2016-05-04 NOTE — Assessment & Plan Note (Signed)
Improvement in symptoms since initiation of Cymbalta. Will increase dose of Cymbalta due to lingering depression symptoms.

## 2016-05-04 NOTE — Progress Notes (Signed)
Pre visit review using our clinic review tool, if applicable. No additional management support is needed unless otherwise documented below in the visit note. 

## 2016-05-04 NOTE — Assessment & Plan Note (Signed)
Overall improved with Cymbalta and stable on Wellbutrin. Has noticed tearfulness, sadness and feeling "numb". Denies SI/HI. Will increase dose to 60 mg and have her follow-up in 6 weeks for reevaluation. Continue Wellbutrin for now.

## 2016-05-26 ENCOUNTER — Other Ambulatory Visit: Payer: Self-pay | Admitting: Primary Care

## 2016-05-26 DIAGNOSIS — F32A Depression, unspecified: Secondary | ICD-10-CM

## 2016-05-26 DIAGNOSIS — F329 Major depressive disorder, single episode, unspecified: Secondary | ICD-10-CM

## 2016-05-26 DIAGNOSIS — F419 Anxiety disorder, unspecified: Principal | ICD-10-CM

## 2016-06-15 ENCOUNTER — Ambulatory Visit (INDEPENDENT_AMBULATORY_CARE_PROVIDER_SITE_OTHER): Admitting: Primary Care

## 2016-06-15 ENCOUNTER — Encounter: Payer: Self-pay | Admitting: Primary Care

## 2016-06-15 VITALS — BP 128/86 | HR 71 | Temp 98.3°F | Ht 65.0 in | Wt 194.1 lb

## 2016-06-15 DIAGNOSIS — F418 Other specified anxiety disorders: Secondary | ICD-10-CM | POA: Diagnosis not present

## 2016-06-15 DIAGNOSIS — F32A Depression, unspecified: Secondary | ICD-10-CM

## 2016-06-15 DIAGNOSIS — B001 Herpesviral vesicular dermatitis: Secondary | ICD-10-CM | POA: Diagnosis not present

## 2016-06-15 DIAGNOSIS — G43901 Migraine, unspecified, not intractable, with status migrainosus: Secondary | ICD-10-CM | POA: Diagnosis not present

## 2016-06-15 DIAGNOSIS — R21 Rash and other nonspecific skin eruption: Secondary | ICD-10-CM | POA: Diagnosis not present

## 2016-06-15 DIAGNOSIS — F329 Major depressive disorder, single episode, unspecified: Secondary | ICD-10-CM

## 2016-06-15 DIAGNOSIS — F419 Anxiety disorder, unspecified: Secondary | ICD-10-CM

## 2016-06-15 MED ORDER — VALACYCLOVIR HCL 500 MG PO TABS: 500.0000 mg | ORAL_TABLET | Freq: Every day | ORAL | 1 refills | Status: DC

## 2016-06-15 NOTE — Progress Notes (Signed)
Pre visit review using our clinic review tool, if applicable. No additional management support is needed unless otherwise documented below in the visit note. 

## 2016-06-15 NOTE — Patient Instructions (Signed)
Start Valtrex 500 mg tablets for prevention of cold sores. Take 1 tablet by mouth once daily for 6 months, then stop.  Continue Cymbalta and Wellbutrin.  Wash your body with benzoyl peroxide daily.  Please notify me if no improvement in your rash and/or if your headaches return.  It was a pleasure to see you today!

## 2016-06-15 NOTE — Assessment & Plan Note (Signed)
Long history of since teen years. Breakouts occurring monthly. Will initiate Valtrex 500 mg once daily for 6 months, then stop to monitor for re-occurrence.  Denies genital sores.

## 2016-06-15 NOTE — Assessment & Plan Note (Signed)
Located to lower back/upper buttocks. Representative of body acne. Will have her try body wash with benzoyl peroxide. She will notify us if no improvement.

## 2016-06-15 NOTE — Assessment & Plan Note (Signed)
Improvement since increase in Cymbalta dose. Continue Wellbutrin and Cymbalta. Denies SI/HI.

## 2016-06-15 NOTE — Progress Notes (Signed)
Subjective:    Patient ID: Nichole Mcbride, female    DOB: 01/09/1986, 30 y.o.   MRN: [id number]  HPI  Nichole Mcbride is a [age] year old female who presents today for follow up.  1) Anxiety and Depression: Currently managed on Cymbalta 60 mg and Wellbutrin 100 mg. Last visit her Cymbalta was increased to 60 mg due to symptoms of increased tearfulness, sadness, feeling "numb", and anxiety. Since her last visit she's noticed improvement in tearfulness and anxiety and is overall feeling better.   2) Migraines: History of daily headaches with migraines occurring 2-3 times weekly. Last visit she was initiated on Topamax 25 mg. She stopped taking this medication after 1 week as she felt drowsy and sleepy. These symptoms improved after she stopped taking the Topamax. She's noticed a reduction in headaches overall since her last visit. She will experience headaches 1-2 times every 2 weeks and denies migraines since her last visit.   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 Cold sore breakouts once monthly, year round, that occur orally. Family history of herpes labialis, denies genital sores. She was previously on daily preventative medication in her teens, but is now managed on medication PRN outbreaks.   4) Rash: Located to lower back/upper buttocks. This has been present for the past 1 month which she first noticed after getting out of the shower one day. Denies itching, pain. Denies changes in diet, soaps, detergents, clothing, diet.   Review of Systems  Respiratory: Negative for shortness of breath.   Cardiovascular: Negative for chest pain.  Skin: Positive for rash.  Neurological:       Improvement in headaches  Psychiatric/Behavioral: Negative for sleep disturbance and suicidal ideas. The patient is not nervous/anxious.        Past Medical History:  Diagnosis Date  . DEPRESSION 06/10/2008  . INSOMNIA 06/10/2008  . Migraine headache   . Raynaud phenomenon   . SCOLIOSIS 06/10/2008   . Sjogren's disease [facility](HCC)      Social History   Social History  . Marital status: Married    Spouse name: N/A  . Number of children: N/A  . Years of education: N/A   Occupational History  . Not on file.   Social History Main Topics  . Smoking status: Never Smoker  . Smokeless tobacco: Not on file  . Alcohol use Not on file  . Drug use: Unknown  . Sexual activity: Not on file   Other Topics Concern  . Not on file   Social History Narrative   Married.   2 children.   Teaches English.   Enjoys spending time with family.    No past surgical history on file.  Family History  Problem Relation Age of Onset  . Diabetes Mother   . Hypertension Mother   . Hypertension Father   . Hypothyroidism Father   . Hypothyroidism Mother     Allergies  Allergen Reactions  . Imitrex [Sumatriptan Succinate]     Current Outpatient Prescriptions on File Prior to Visit  Medication Sig Dispense Refill  . buPROPion (WELLBUTRIN) 100 MG tablet Take 1 tablet (100 mg total) by mouth daily. 90 tablet 2  . DULoxetine (CYMBALTA) 60 MG capsule TAKE ONE CAPSULE BY MOUTH DAILY 90 capsule 0   No current facility-administered medications on file prior to visit.     BP 128/86   Pulse 71   Temp 98.3 F (36.8 C) (Oral)   Ht 5\' 5"  (1.651 m)   Wt 194  lb 1.9 oz (88.1 kg)   LMP 05/28/2016   SpO2 98%   BMI 32.30 kg/m    Objective:   Physical Exam  Constitutional: She appears well-nourished.  HENT:  No oral sores present.  Cardiovascular: Normal rate and regular rhythm.   Pulmonary/Chest: Effort normal and breath sounds normal.  Skin: Skin is warm and dry.  Minor skin breakout with small, circular bumps representative of body acne. Non tender. No pustules.  Psychiatric: She has a normal mood and affect.          Assessment & Plan:

## 2016-06-15 NOTE — Assessment & Plan Note (Signed)
Topamax caused drowsiness, despite taking HS. She stopped this medication several weeks ago. Headaches have improved, no migraines since last visit. Will continue to monitor.

## 2016-06-23 ENCOUNTER — Encounter: Payer: Self-pay | Admitting: Family Medicine

## 2016-06-23 ENCOUNTER — Ambulatory Visit (INDEPENDENT_AMBULATORY_CARE_PROVIDER_SITE_OTHER): Admitting: Family Medicine

## 2016-06-23 VITALS — BP 118/82 | HR 64 | Temp 98.1°F | Wt 196.2 lb

## 2016-06-23 DIAGNOSIS — J029 Acute pharyngitis, unspecified: Secondary | ICD-10-CM

## 2016-06-23 LAB — POCT RAPID STREP A (OFFICE): Rapid Strep A Screen: NEGATIVE

## 2016-06-23 NOTE — Patient Instructions (Signed)

## 2016-06-23 NOTE — Progress Notes (Signed)
Pre visit review using our clinic review tool, if applicable. No additional management support is needed unless otherwise documented below in the visit note. 

## 2016-06-23 NOTE — Progress Notes (Signed)
   Subjective:    Patient ID: Nichole Mcbride, female    DOB: 01/10/1986, 30 y.o.   MRN: [id number]  HPI This is a 30 yo female who presents today with 2 days of sore throat, exudate. Had a fever last night to 101. Little nasal drainage. No SOB or wheeze. No ear pain. Taking ibuprofen 800 mg with some improvement in pain. Also using salt water gargles and otc spray.   Past Medical History:  Diagnosis Date  . DEPRESSION 06/10/2008  . INSOMNIA 06/10/2008  . Migraine headache   . Raynaud phenomenon   . SCOLIOSIS 06/10/2008  . Sjogren's disease (HCC)    No past surgical history on file. Family History  Problem Relation Age of Onset  . Diabetes Mother   . Hypertension Mother   . Hypertension Father   . Hypothyroidism Father   . Hypothyroidism Mother    Social History  Substance Use Topics  . Smoking status: Never Smoker  . Smokeless tobacco: Not on file  . Alcohol use Not on file      Review of Systems Per HPI    Objective:   Physical Exam  Constitutional: She appears well-developed and well-nourished. No distress.  HENT:  Head: Normocephalic and atraumatic.  Right Ear: Tympanic membrane, external ear and ear canal normal.  Left Ear: Tympanic membrane, external ear and ear canal normal.  Nose: Nose normal.  Mouth/Throat: Uvula is midline. No oropharyngeal exudate.  Skin: She is not diaphoretic.  Vitals reviewed.     BP 118/82   Pulse 64   Temp 98.1 F (36.7 C) (Oral)   Wt 196 lb 4 oz (89 kg)   LMP 05/28/2016   BMI 32.66 kg/m  Wt Readings from Last 3 Encounters:  06/23/16 196 lb 4 oz (89 kg)  06/15/16 194 lb 1.9 oz (88.1 kg)  05/04/16 191 lb (86.6 kg)   Results for orders placed or performed in visit on 06/23/16  POCT rapid strep A  Result Value Ref Range   Rapid Strep A Screen Negative Negative       Assessment & Plan:  1. Sore throat - likely viral with rapid strep negative, will send culture for confirmation - Provided written and verbal  information regarding diagnosis and treatment. - continue ibuprofen q 8-12 hours prn - RTC precautions reviewed - POCT rapid strep A - Culture, Group A Strep   Olean Reeeborah Shakaria Raphael, FNP-BC  [facility]  06/23/2016 4:04 PM

## 2016-06-25 LAB — CULTURE, GROUP A STREP: ORGANISM ID, BACTERIA: NORMAL

## 2016-07-10 ENCOUNTER — Ambulatory Visit (INDEPENDENT_AMBULATORY_CARE_PROVIDER_SITE_OTHER): Admitting: Primary Care

## 2016-07-10 ENCOUNTER — Encounter: Payer: Self-pay | Admitting: Primary Care

## 2016-07-10 VITALS — BP 110/78 | HR 103 | Temp 98.3°F | Ht 65.0 in | Wt 196.4 lb

## 2016-07-10 DIAGNOSIS — R52 Pain, unspecified: Secondary | ICD-10-CM | POA: Diagnosis not present

## 2016-07-10 LAB — POC INFLUENZA A&B (BINAX/QUICKVUE)
Influenza A, POC: NEGATIVE
Influenza B, POC: NEGATIVE

## 2016-07-10 NOTE — Progress Notes (Signed)
Subjective:    Patient ID: Nichole Mcbride, female    DOB: 08/20/1986, 30 y.o.   MRN: [id number]  HPI  [name] is a 30 year old female who presents today with a chief complaint of body aches. She also reports fever (highest of 101), sore throat, headache. Her symptoms began Saturday this past weekend while out camping. She's been taking tylenol and ibuprofen with reduction in her fevers. She denies sick contacts, cough, sinus pressure, ear pain. Her last fever was this morning around 5 am.  Review of Systems  Constitutional: Positive for chills, fatigue and fever.  HENT: Positive for sore throat. Negative for congestion, ear pain, postnasal drip and sinus pressure.   Respiratory: Negative for cough and shortness of breath.   Musculoskeletal: Positive for arthralgias.  Neurological: Positive for headaches.       Past Medical History:  Diagnosis Date  . DEPRESSION 06/10/2008  . INSOMNIA 06/10/2008  . Migraine headache   . Raynaud phenomenon   . SCOLIOSIS 06/10/2008  . Sjogren's disease [facility](HCC)      Social History   Social History  . Marital status: Married    Spouse name: N/A  . Number of children: N/A  . Years of education: N/A   Occupational History  . Not on file.   Social History Main Topics  . Smoking status: Never Smoker  . Smokeless tobacco: Not on file  . Alcohol use Not on file  . Drug use: Unknown  . Sexual activity: Not on file   Other Topics Concern  . Not on file   Social History Narrative   Married.   2 children.   Teaches English.   Enjoys spending time with family.    No past surgical history on file.  Family History  Problem Relation Age of Onset  . Diabetes Mother   . Hypertension Mother   . Hypertension Father   . Hypothyroidism Father   . Hypothyroidism Mother     Allergies  Allergen Reactions  . Imitrex [Sumatriptan Succinate]     Current Outpatient Prescriptions on File Prior to Visit  Medication Sig Dispense Refill    . buPROPion (WELLBUTRIN) 100 MG tablet Take 1 tablet (100 mg total) by mouth daily. 90 tablet 2  . DULoxetine (CYMBALTA) 60 MG capsule TAKE ONE CAPSULE BY MOUTH DAILY 90 capsule 0  . valACYclovir (VALTREX) 500 MG tablet Take 1 tablet (500 mg total) by mouth daily. 90 tablet 1   No current facility-administered medications on file prior to visit.     BP 110/78   Pulse (!) 103   Temp 98.3 F (36.8 C) (Oral)   Ht 5\' 5"  (1.651 m)   Wt 196 lb 6.4 oz (89.1 kg)   LMP 06/27/2016   SpO2 97%   BMI 32.68 kg/m    Objective:   Physical Exam  Constitutional: She appears well-nourished. She appears ill.  HENT:  Right Ear: Tympanic membrane and ear canal normal.  Left Ear: Tympanic membrane and ear canal normal.  Nose: Right sinus exhibits no maxillary sinus tenderness and no frontal sinus tenderness. Left sinus exhibits no maxillary sinus tenderness and no frontal sinus tenderness.  Mouth/Throat: Oropharynx is clear and moist.  Eyes: Conjunctivae are normal.  Neck: Neck supple.  Cardiovascular: Normal rate and regular rhythm.   Pulmonary/Chest: Effort normal and breath sounds normal. She has no wheezes. She has no rales.  Lymphadenopathy:    She has no cervical adenopathy.  Skin: Skin is warm.  clammy  Assessment & Plan:  Body Aches:  Also with fever, fatigue, sore throat. Symptoms x 2 days since camping outdoors. Exam today with clear lungs, mild sinus tachycardia, does appear ill but stable. Rapid Flu: Negative Suspect viral involvement and will treat with conservative measures.  Continue tylenol/ibuprofen for body aches and fevers, warm salt gargles for sore throat. Discussed strict return precautions.  Morrie Sheldon, NP

## 2016-07-10 NOTE — Progress Notes (Signed)
Pre visit review using our clinic review tool, if applicable. No additional management support is needed unless otherwise documented below in the visit note. 

## 2016-07-10 NOTE — Patient Instructions (Signed)
Your flu test was negative, however, your symptoms are representative of a viral illness which will resolve on its own over time. Our goal is to treat your symptoms in order to aid your body in the healing process and to make you more comfortable.   Continue tylenol and ibuprofen as needed for body aches and fevers. Try warm salt gargles for sore throat.   Please notify me if you develop persistent fevers of 101, start coughing up green mucous, notice increased fatigue or weakness, or feel worse after 1 week of onset of symptoms.   Increase consumption of water intake and rest.  It was a pleasure to see you today!  Viral Infections A viral infection can be caused by different types of viruses.Most viral infections are not serious and resolve on their own. However, some infections may cause severe symptoms and may lead to further complications. SYMPTOMS Viruses can frequently cause:  Minor sore throat.  Aches and pains.  Headaches.  Runny nose.  Different types of rashes.  Watery eyes.  Tiredness.  Cough.  Loss of appetite.  Gastrointestinal infections, resulting in nausea, vomiting, and diarrhea. These symptoms do not respond to antibiotics because the infection is not caused by bacteria. However, you might catch a bacterial infection following the viral infection. This is sometimes called a "superinfection." Symptoms of such a bacterial infection may include:  Worsening sore throat with pus and difficulty swallowing.  Swollen neck glands.  Chills and a high or persistent fever.  Severe headache.  Tenderness over the sinuses.  Persistent overall ill feeling (malaise), muscle aches, and tiredness (fatigue).  Persistent cough.  Yellow, green, or brown mucus production with coughing. HOME CARE INSTRUCTIONS   Only take over-the-counter or prescription medicines for pain, discomfort, diarrhea, or fever as directed by your caregiver.  Drink enough water and fluids  to keep your urine clear or pale yellow. Sports drinks can provide valuable electrolytes, sugars, and hydration.  Get plenty of rest and maintain proper nutrition. Soups and broths with crackers or rice are fine. SEEK IMMEDIATE MEDICAL CARE IF:   You have severe headaches, shortness of breath, chest pain, neck pain, or an unusual rash.  You have uncontrolled vomiting, diarrhea, or you are unable to keep down fluids.  You or your child has an oral temperature above 102 F (38.9 C), not controlled by medicine.  Your baby is older than 3 months with a rectal temperature of 102 F (38.9 C) or higher.  Your baby is 783 months old or younger with a rectal temperature of 100.4 F (38 C) or higher. MAKE SURE YOU:   Understand these instructions.  Will watch your condition.  Will get help right away if you are not doing well or get worse.   This information is not intended to replace advice given to you by your health care provider. Make sure you discuss any questions you have with your health care provider.   Document Released: 06/21/2005 Document Revised: 12/04/2011 Document Reviewed: 02/17/2015 Elsevier Interactive Patient Education [employer]2016 Elsevier Inc.

## 2016-07-12 ENCOUNTER — Ambulatory Visit (INDEPENDENT_AMBULATORY_CARE_PROVIDER_SITE_OTHER): Admitting: Primary Care

## 2016-07-12 VITALS — BP 120/82 | HR 85 | Temp 98.4°F | Ht 65.0 in | Wt 194.0 lb

## 2016-07-12 DIAGNOSIS — R52 Pain, unspecified: Secondary | ICD-10-CM

## 2016-07-12 DIAGNOSIS — R509 Fever, unspecified: Secondary | ICD-10-CM

## 2016-07-12 LAB — CBC WITH DIFFERENTIAL/PLATELET
BASOS ABS: 0 10*3/uL (ref 0.0–0.1)
Basophils Relative: 0.4 % (ref 0.0–3.0)
EOS ABS: 0 10*3/uL (ref 0.0–0.7)
Eosinophils Relative: 0.1 % (ref 0.0–5.0)
HEMATOCRIT: 42.4 % (ref 36.0–46.0)
Hemoglobin: 14.4 g/dL (ref 12.0–15.0)
LYMPHS PCT: 39.2 % (ref 12.0–46.0)
Lymphs Abs: 2.6 10*3/uL (ref 0.7–4.0)
MCHC: 33.9 g/dL (ref 30.0–36.0)
MCV: 85.3 fl (ref 78.0–100.0)
Monocytes Absolute: 0.5 10*3/uL (ref 0.1–1.0)
Monocytes Relative: 7.9 % (ref 3.0–12.0)
NEUTROS ABS: 3.4 10*3/uL (ref 1.4–7.7)
Neutrophils Relative %: 52.4 % (ref 43.0–77.0)
PLATELETS: 286 10*3/uL (ref 150.0–400.0)
RBC: 4.98 Mil/uL (ref 3.87–5.11)
RDW: 14.6 % (ref 11.5–15.5)
WBC: 6.5 10*3/uL (ref 4.0–10.5)

## 2016-07-12 LAB — POCT RAPID STREP A (OFFICE): Rapid Strep A Screen: NEGATIVE

## 2016-07-12 LAB — COMPREHENSIVE METABOLIC PANEL
ALBUMIN: 4.5 g/dL (ref 3.5–5.2)
ALT: 13 U/L (ref 0–35)
AST: 20 U/L (ref 0–37)
Alkaline Phosphatase: 80 U/L (ref 39–117)
BUN: 10 mg/dL (ref 6–23)
CHLORIDE: 103 meq/L (ref 96–112)
CO2: 28 meq/L (ref 19–32)
CREATININE: 0.84 mg/dL (ref 0.40–1.20)
Calcium: 9.6 mg/dL (ref 8.4–10.5)
GFR: 85.47 mL/min (ref >60.00)
GLUCOSE: 89 mg/dL (ref 70–99)
Potassium: 4.3 mEq/L (ref 3.5–5.1)
SODIUM: 138 meq/L (ref 135–145)
Total Bilirubin: 0.6 mg/dL (ref 0.2–1.2)
Total Protein: 8 g/dL (ref 6.0–8.3)

## 2016-07-12 LAB — POC INFLUENZA A&B (BINAX/QUICKVUE)
INFLUENZA A, POC: NEGATIVE
Influenza B, POC: NEGATIVE

## 2016-07-12 LAB — MONONUCLEOSIS SCREEN: Mono Screen: NEGATIVE

## 2016-07-12 NOTE — Progress Notes (Signed)
Pre visit review using our clinic review tool, if applicable. No additional management support is needed unless otherwise documented below in the visit note. 

## 2016-07-12 NOTE — Patient Instructions (Addendum)
Your strep and flu tests are negative.  Your symptoms are representative of a viral illness which will resolve on its own over time. Our goal is to treat your symptoms in order to aid your body in the healing process and to make you more comfortable.   Continue ibuprofen and tylenol.  Try warm salt gargles, throat lozenges, and chloraseptic spray.  Complete lab work prior to leaving today. I will notify you of your results once received.   It was a pleasure to see you today!

## 2016-07-12 NOTE — Progress Notes (Signed)
Subjective:    Patient ID: Nichole Mcbride, female    DOB: 25-Oct-1985, 30 y.o.   MRN: [id number]  HPI  Nichole Mcbride is a 30 year old female who presents today with a chief complaint of sore throat. She was evaluated on 10/16 with complaints of body aches, sore throat, and fevers that began Saturday last weekend while camping. She tested negative for influenza and her exam was overall unremarkable. She was presumed to have a viral pharyngitis.  Since her last visit she's continuing to experience sore throat and has noticed increased swelling. She's continuing to experience low grade fevers, fatigue, and body aches. Monday night she had an episode of vomiting and diarrhea that has since resolved. She denies cough, abdominal pain.   Review of Systems  Constitutional: Positive for chills, fatigue and fever.  HENT: Negative for congestion.   Respiratory: Negative for cough.   Gastrointestinal: Negative for diarrhea, nausea and vomiting.  Musculoskeletal: Positive for arthralgias.  Neurological: Negative for dizziness and headaches.       Past Medical History:  Diagnosis Date  . DEPRESSION 06/10/2008  . INSOMNIA 06/10/2008  . Migraine headache   . Raynaud phenomenon   . SCOLIOSIS 06/10/2008  . Sjogren's disease [facility])      Social History   Social History  . Marital status: Married    Spouse name: N/A  . Number of children: N/A  . Years of education: N/A   Occupational History  . Not on file.   Social History Main Topics  . Smoking status: Never Smoker  . Smokeless tobacco: Not on file  . Alcohol use Not on file  . Drug use: Unknown  . Sexual activity: Not on file   Other Topics Concern  . Not on file   Social History Narrative   Married.   2 children.   Teaches English.   Enjoys spending time with family.    No past surgical history on file.  Family History  Problem Relation Age of Onset  . Diabetes Mother   . Hypertension Mother   . Hypertension Father     . Hypothyroidism Father   . Hypothyroidism Mother     Allergies  Allergen Reactions  . Imitrex [Sumatriptan Succinate]     Current Outpatient Prescriptions on File Prior to Visit  Medication Sig Dispense Refill  . buPROPion (WELLBUTRIN) 100 MG tablet Take 1 tablet (100 mg total) by mouth daily. 90 tablet 2  . DULoxetine (CYMBALTA) 60 MG capsule TAKE ONE CAPSULE BY MOUTH DAILY 90 capsule 0  . valACYclovir (VALTREX) 500 MG tablet Take 1 tablet (500 mg total) by mouth daily. 90 tablet 1   No current facility-administered medications on file prior to visit.     BP 120/82   Pulse 85   Temp 98.4 F (36.9 C) (Oral)   Ht 5\' 5"  (1.651 m)   Wt 194 lb (88 kg)   LMP 06/27/2016   SpO2 97%   BMI 32.28 kg/m    Objective:   Physical Exam  Constitutional: She appears well-nourished.  HENT:  Right Ear: Tympanic membrane and ear canal normal.  Left Ear: Tympanic membrane and ear canal normal.  Nose: Right sinus exhibits no maxillary sinus tenderness and no frontal sinus tenderness. Left sinus exhibits no maxillary sinus tenderness and no frontal sinus tenderness.  Mouth/Throat: Posterior oropharyngeal edema and posterior oropharyngeal erythema present. No oropharyngeal exudate.  Eyes: Conjunctivae are normal.  Neck: Neck supple.  Cardiovascular: Normal rate and regular rhythm.  Pulmonary/Chest: Effort normal and breath sounds normal. She has no wheezes. She has no rales.  Lymphadenopathy:    She has no cervical adenopathy.  Skin: Skin is warm and dry.          Assessment & Plan:  Body Aches:  Continued with sore throat, decrease in appetite, low grade fevers. Temporary improvement with tylenol or ibuprofen. Exam today with mild-moderate erythema/edema, no exudate. Does not appear sickly, clear lungs. Rapid Strep: Negative Rapid Flu: Negative Highly suspect viral involvement. Will also obtain CBC, CMP, Monospot testing for further evaluation. Continue to treat with  conservative measures.  Morrie Sheldonlark,Jamita Mckelvin Kendal, NP

## 2016-08-02 ENCOUNTER — Other Ambulatory Visit: Payer: Self-pay | Admitting: Primary Care

## 2016-08-02 DIAGNOSIS — F32A Depression, unspecified: Secondary | ICD-10-CM

## 2016-08-02 DIAGNOSIS — F329 Major depressive disorder, single episode, unspecified: Secondary | ICD-10-CM

## 2016-08-02 DIAGNOSIS — F419 Anxiety disorder, unspecified: Principal | ICD-10-CM

## 2016-08-25 ENCOUNTER — Ambulatory Visit (INDEPENDENT_AMBULATORY_CARE_PROVIDER_SITE_OTHER): Admitting: Family Medicine

## 2016-08-25 ENCOUNTER — Encounter: Payer: Self-pay | Admitting: Family Medicine

## 2016-08-25 VITALS — BP 138/82 | HR 72 | Temp 98.2°F | Wt 196.2 lb

## 2016-08-25 DIAGNOSIS — J029 Acute pharyngitis, unspecified: Secondary | ICD-10-CM

## 2016-08-25 MED ORDER — AMOXICILLIN 500 MG PO CAPS: 500.0000 mg | ORAL_CAPSULE | Freq: Two times a day (BID) | ORAL | 0 refills | Status: DC

## 2016-08-25 NOTE — Progress Notes (Signed)
   Subjective:    Patient ID: Nichole Mcbride, female    DOB: 07/04/1986, 30 y.o.   MRN: [id number]  HPI This is a 30 yo female who presents today with 2 weeks of coughing, sneezing and nasal drainage that seemed to improve until 2 days ago when she had fever to 101, worsening sore throat and exudate, bilateral ear pain (right > left). Taking ibuprofen/ cold medicine with no relief. Cough productive of yellow/green sputum. No SOB or wheeze. Has frequent strep infections. Children have been sick.  Husband in [employer] will be home over the holidays.   Past Medical History:  Diagnosis Date  . DEPRESSION 06/10/2008  . INSOMNIA 06/10/2008  . Migraine headache   . Raynaud phenomenon   . SCOLIOSIS 06/10/2008  . Sjogren's disease (HCC)    No past surgical history on file. Family History  Problem Relation Age of Onset  . Diabetes Mother   . Hypertension Mother   . Hypertension Father   . Hypothyroidism Father   . Hypothyroidism Mother    Social History  Substance Use Topics  . Smoking status: Never Smoker  . Smokeless tobacco: Not on file  . Alcohol use Not on file      Review of Systems Per HPI    Objective:   Physical Exam  Constitutional: She is oriented to person, place, and time. She appears well-developed and well-nourished. She appears ill. No distress.  HENT:  Head: Normocephalic and atraumatic.  Right Ear: External ear normal.  Left Ear: External ear normal.  Nose: Nose normal.  Mouth/Throat: Mucous membranes are normal. Oropharyngeal exudate and posterior oropharyngeal erythema present.    Neck: Normal range of motion. Neck supple.  Cardiovascular: Normal rate, regular rhythm and normal heart sounds.   Pulmonary/Chest: Effort normal and breath sounds normal. Stridor: right.  Lymphadenopathy:    She has cervical adenopathy.  Neurological: She is alert and oriented to person, place, and time.  Skin: Skin is warm and dry. She is not  diaphoretic.  Psychiatric: She has a normal mood and affect. Her behavior is normal. Judgment and thought content normal.  Vitals reviewed.     BP 138/82   Pulse 72   Temp 98.2 F (36.8 C) (Oral)   Wt 196 lb 4 oz (89 kg)   LMP 08/14/2016   SpO2 98%   BMI 32.66 kg/m  Wt Readings from Last 3 Encounters:  08/25/16 196 lb 4 oz (89 kg)  07/12/16 194 lb (88 kg)  07/10/16 196 lb 6.4 oz (89.1 kg)       Assessment & Plan:  1. Exudative pharyngitis - Provided written and verbal information regarding diagnosis and treatment. - fluids, ibuprofen, rest - amoxicillin (AMOXIL) 500 MG capsule; Take 1 capsule (500 mg total) by mouth 2 (two) times daily.  Dispense: 20 capsule; Refill: 0 - RTC precautions reviewed  Olean Reeeborah Gessner, FNP-BC  [facility] [place]  08/25/2016 1:30 PM

## 2016-08-25 NOTE — Progress Notes (Signed)
Pre visit review using our clinic review tool, if applicable. No additional management support is needed unless otherwise documented below in the visit note. 

## 2016-08-25 NOTE — Patient Instructions (Signed)

## 2016-09-18 ENCOUNTER — Emergency Department (HOSPITAL_COMMUNITY)
Admission: EM | Admit: 2016-09-18 | Discharge: 2016-09-18 | Disposition: A | Discharge status: Home / Self Care | Attending: Emergency Medicine | Admitting: Emergency Medicine

## 2016-09-18 ENCOUNTER — Encounter (HOSPITAL_COMMUNITY): Payer: Self-pay

## 2016-09-18 DIAGNOSIS — Z79899 Other long term (current) drug therapy: Secondary | ICD-10-CM | POA: Insufficient documentation

## 2016-09-18 DIAGNOSIS — F17200 Nicotine dependence, unspecified, uncomplicated: Secondary | ICD-10-CM | POA: Insufficient documentation

## 2016-09-18 DIAGNOSIS — H00031 Abscess of right upper eyelid: Secondary | ICD-10-CM | POA: Insufficient documentation

## 2016-09-18 DIAGNOSIS — L03213 Periorbital cellulitis: Secondary | ICD-10-CM

## 2016-09-18 DIAGNOSIS — H5711 Ocular pain, right eye: Secondary | ICD-10-CM | POA: Diagnosis present

## 2016-09-18 MED ORDER — CLINDAMYCIN HCL 300 MG PO CAPS: 300.0000 mg | ORAL_CAPSULE | Freq: Three times a day (TID) | ORAL | 0 refills | Status: DC

## 2016-09-18 NOTE — ED Notes (Signed)
[place] [facility] and starting to have some swelling of the R eyelid.  Pt used warm compresses thinking it was a sty.  Pt went to Urgent Care on Friday because her L eye was starting to both her.  Pt was given topical antibiotic but it has not helped.

## 2016-09-18 NOTE — ED Provider Notes (Signed)
[facility]-EMERGENCY DEPT Provider Note   CSN: [id number] Arrival date & time: 09/18/16  1504     History   Chief Complaint Chief Complaint  Patient presents with  . Eye Pain    HPI Nichole Mcbride is a 30 y.o. female.  Patient with Sjogrens disease presents with worsening pain and swelling to the upper eyelid. Patient was given topical antibiotics urgent care however this is not improved and only worsened. No pain with extraocular muscle function. Patient had low-grade fever yesterday. No vomiting or other symptoms at this time.      Past Medical History:  Diagnosis Date  . DEPRESSION 06/10/2008  . INSOMNIA 06/10/2008  . Migraine headache   . Raynaud phenomenon   . SCOLIOSIS 06/10/2008  . Sjogren's disease [facility](HCC)     Patient Active Problem List   Diagnosis Date Noted  . Rash and nonspecific skin eruption 06/15/2016  . 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 Encounter for routine adult medical exam with abnormal findings 05/04/2016  . Fibromyalgia 03/23/2016  . Anxiety and depression 03/23/2016  . Sjogren's disease (HCC) 03/23/2016  . Migraine headache 10/14/2011  . Raynaud's disease 09/20/2011  . DEPRESSION 06/10/2008  . SCOLIOSIS 06/10/2008    History reviewed. No pertinent surgical history.  OB History    No data available       Home Medications    Prior to Admission medications   Medication Sig Start Date End Date Taking? Authorizing Provider  amoxicillin (AMOXIL) 500 MG capsule Take 1 capsule (500 mg total) by mouth 2 (two) times daily. 08/25/16   Emi Belfasteborah B Gessner, FNP  buPROPion (WELLBUTRIN) 100 MG tablet Take 1 tablet (100 mg total) by mouth daily. 03/23/16   Doreene NestKatherine K Clark, NP  clindamycin (CLEOCIN) 300 MG capsule Take 1 capsule (300 mg total) by mouth 3 (three) times daily. 09/18/16   Blane OharaJoshua Yazen Rosko, MD  DULoxetine (CYMBALTA) 60 MG capsule TAKE ONE CAPSULE BY MOUTH DAILY 08/02/16   Doreene NestKatherine K Clark, NP  valACYclovir (VALTREX) 500 MG tablet Take 1 tablet  (500 mg total) by mouth daily. 06/15/16   Doreene NestKatherine K Clark, NP    Family History Family History  Problem Relation Age of Onset  . Diabetes Mother   . Hypertension Mother   . Hypothyroidism Mother   . Hypertension Father   . Hypothyroidism Father     Social History Social History  Substance Use Topics  . Smoking status: Current Some Day Smoker  . Smokeless tobacco: Never Used  . Alcohol use Yes     Allergies   Ciprofloxacin hcl and Imitrex [sumatriptan succinate]   Review of Systems Review of Systems  Constitutional: Positive for fever. Negative for chills.  HENT: Negative for ear pain and sore throat.   Eyes: Positive for pain and discharge. Negative for visual disturbance.  Respiratory: Negative for cough and shortness of breath.   Cardiovascular: Negative for chest pain and palpitations.  Gastrointestinal: Negative for abdominal pain and vomiting.  Genitourinary: Negative for dysuria and hematuria.  Musculoskeletal: Negative for arthralgias and back pain.  Skin: Negative for color change and rash.  Neurological: Negative for seizures and syncope.  All other systems reviewed and are negative.    Physical Exam Updated Vital Signs BP 128/74   Pulse 120   Temp 97.8 F (36.6 C)   Resp 16   LMP 09/06/2016   SpO2 98%   Physical Exam  Constitutional: She appears well-developed and well-nourished. No distress.  HENT:  Head: Normocephalic and atraumatic.  Eyes: Conjunctivae  and EOM are normal. Pupils are equal, round, and reactive to light. Right eye exhibits discharge. Left eye exhibits no discharge. No scleral icterus.    Stye right upper lid, mild crusting, mild tender, no FB seen, no pain with eomf  Neck: Neck supple.  Cardiovascular: Normal rate and regular rhythm.   No murmur heard. Pulmonary/Chest: Effort normal.  Musculoskeletal: She exhibits no edema.  Neurological: She is alert.  Skin: Skin is warm and dry.  Psychiatric: She has a normal mood and  affect.  Nursing note and vitals reviewed.    ED Treatments / Results  Labs (all labs ordered are listed, but only abnormal results are displayed) Labs Reviewed - No data to display  EKG  EKG Interpretation None       Radiology No results found.  Procedures Procedures (including critical care time)  Medications Ordered in ED Medications - No data to display   Initial Impression / Assessment and Plan / ED Course  I have reviewed the triage vital signs and the nursing notes.  Pertinent labs & imaging results that were available during my care of the patient were reviewed by me and considered in my medical decision making (see chart for details).  Clinical Course    Patient presents with infected stye/preseptal cellulitis. Plan for oral antibiotics and close follow-up with ophthalmology.  Results and differential diagnosis were discussed with the patient/parent/guardian. Xrays were independently reviewed by myself.  Close follow up outpatient was discussed, comfortable with the plan.   Medications - No data to display  Vitals:   09/18/16 1515 09/18/16 1516  BP:  128/74  Pulse:  120  Resp:  16  Temp: 97.8 F (36.6 C) 97.8 F (36.6 C)  TempSrc: Oral   SpO2:  98%    Final diagnoses:  Preseptal cellulitis of right upper eyelid     Final Clinical Impressions(s) / ED Diagnoses   Final diagnoses:  Preseptal cellulitis of right upper eyelid    New Prescriptions New Prescriptions   CLINDAMYCIN (CLEOCIN) 300 MG CAPSULE    Take 1 capsule (300 mg total) by mouth 3 (three) times daily.     Blane OharaJoshua Owyn Raulston, MD 09/18/16 [phone]

## 2016-09-18 NOTE — Discharge Instructions (Signed)
Call Dr Marcha Solders Weaver [phone] to be seen urgently this week. Continue warm compresses. If you were given medicines take as directed.  If you are on coumadin or contraceptives realize their levels and effectiveness is altered by many different medicines.  If you have any reaction (rash, tongues swelling, other) to the medicines stop taking and see a physician.    If your blood pressure was elevated in the ER make sure you follow up for management with a primary doctor or return for chest pain, shortness of breath or stroke symptoms.  Please follow up as directed and return to the ER or see a physician for new or worsening symptoms.  Thank you. Vitals:   09/18/16 1515 09/18/16 1516  BP:  128/74  Pulse:  120  Resp:  16  Temp: 97.8 F (36.6 C) 97.8 F (36.6 C)  TempSrc: Oral   SpO2:  98%

## 2016-09-18 NOTE — ED Triage Notes (Signed)
Pt complaining of generalized fatigue x 1 week. Pt states sty in R eye. Pt with redness and swelling to upper R eye lid. Pt states seen by UC, received abx, no improvement. Pt states new spreading to L eye this am. Pt complaining of R sided blurry vision.

## 2016-09-18 NOTE — ED Notes (Signed)
Pt verbalized understanding discharge instructions and denies any further needs or questions at this time. VS stable, ambulatory and steady gait.   

## 2016-10-10 ENCOUNTER — Ambulatory Visit (INDEPENDENT_AMBULATORY_CARE_PROVIDER_SITE_OTHER): Admitting: Primary Care

## 2016-10-10 ENCOUNTER — Encounter: Payer: Self-pay | Admitting: Primary Care

## 2016-10-10 VITALS — BP 120/82 | HR 85 | Temp 98.7°F | Ht 65.0 in | Wt 200.2 lb

## 2016-10-10 DIAGNOSIS — L298 Other pruritus: Secondary | ICD-10-CM

## 2016-10-10 DIAGNOSIS — F418 Other specified anxiety disorders: Secondary | ICD-10-CM | POA: Diagnosis not present

## 2016-10-10 DIAGNOSIS — F32A Depression, unspecified: Secondary | ICD-10-CM

## 2016-10-10 DIAGNOSIS — B379 Candidiasis, unspecified: Secondary | ICD-10-CM

## 2016-10-10 DIAGNOSIS — T3695XA Adverse effect of unspecified systemic antibiotic, initial encounter: Secondary | ICD-10-CM

## 2016-10-10 DIAGNOSIS — F419 Anxiety disorder, unspecified: Principal | ICD-10-CM

## 2016-10-10 DIAGNOSIS — F329 Major depressive disorder, single episode, unspecified: Secondary | ICD-10-CM

## 2016-10-10 MED ORDER — FLUCONAZOLE 150 MG PO TABS: 150.0000 mg | ORAL_TABLET | Freq: Once | ORAL | 0 refills | Status: AC

## 2016-10-10 MED ORDER — BUPROPION HCL ER (XL) 150 MG PO TB24
150.0000 mg | ORAL_TABLET | Freq: Every day | ORAL | 0 refills | Status: DC
Start: 2016-10-10 — End: 2017-01-05

## 2016-10-10 NOTE — Patient Instructions (Addendum)
We've changed your Wellbutrin from 100 mg once daily to Wellbutrin XL 150 mg once daily.   Continue your Cymbalta 60 mg as prescribed.  Stop by the front desk and speak with either Shirlee LimerickMarion regarding your referral to therapy.  Please schedule a follow up appointment with me in 6 weeks.  It was a pleasure to see you today!

## 2016-10-10 NOTE — Progress Notes (Signed)
Pre visit review using our clinic review tool, if applicable. No additional management support is needed unless otherwise documented below in the visit note. 

## 2016-10-10 NOTE — Assessment & Plan Note (Signed)
Improved temporarily on increased dose of Cymbalta. Long discussion today regarding treatment options. Referral to therapy placed given recent events with her friend. Adjusted Wellbutrin to 150 XL as she was likely not getting long term treatment from prior Wellbutrin dose.  Continue cymbalta. Denies SI/HI. Follow up in 6 weeks.

## 2016-10-10 NOTE — Progress Notes (Signed)
Subjective:    Patient ID: Nichole Mcbride, female    DOB: 10/02/1985, 31 y.o.   MRN: [id number]  HPI  Nichole Mcbride is a 31 year old female with a history of anxiety and depression who presents today to discuss a traumatic event and depression. She is currently managed on Cymbalta 60 mg and Wellbutrin 100 mg daily. We increased her Cymbalta in September 2017 due to increased tearfulness, sadness, and anxiety.   Saturday this past weekend she witnessed a friend, who suffers depression, threat to slit her wrists. Her friend was admitted for treatment and is doing better.   From December until now she's experiencing difficulty getting out of bed, she has no motivation to do anything, and increased tearfulness. She felt better initially after the increase in her Cymbalta, but then started to experience depression. She is currently not seeing a therapist. She denies SI/HI.   2) Vaginal Itching: Vaginal itching and mild whitish discharge that has been present for the past 2-3 days. She's been on three different antibiotics since Christmas for cysts to her eyes. She denies urinary symptoms.    Review of Systems  Constitutional: Positive for fatigue.  Respiratory: Negative for shortness of breath.   Cardiovascular: Negative for chest pain and palpitations.  Genitourinary: Positive for vaginal discharge. Negative for dysuria and frequency.       Vaginal itching  Psychiatric/Behavioral: Negative for suicidal ideas.       Depression       Past Medical History:  Diagnosis Date  . DEPRESSION 06/10/2008  . INSOMNIA 06/10/2008  . Migraine headache   . Raynaud phenomenon   . SCOLIOSIS 06/10/2008  . Sjogren's disease [facility](HCC)      Social History   Social History  . Marital status: Married    Spouse name: N/A  . Number of children: N/A  . Years of education: N/A   Occupational History  . Not on file.   Social History Main Topics  . Smoking status: Current Some Day Smoker  . Smokeless  tobacco: Never Used  . Alcohol use Yes  . Drug use: No  . Sexual activity: Not on file   Other Topics Concern  . Not on file   Social History Narrative   Married.   2 children.   Teaches English.   Enjoys spending time with family.    No past surgical history on file.  Family History  Problem Relation Age of Onset  . Diabetes Mother   . Hypertension Mother   . Hypothyroidism Mother   . Hypertension Father   . Hypothyroidism Father     Allergies  Allergen Reactions  . Ciprofloxacin Hcl     "bad reaction"  . Imitrex [Sumatriptan Succinate]     Current Outpatient Prescriptions on File Prior to Visit  Medication Sig Dispense Refill  . clindamycin (CLEOCIN) 300 MG capsule Take 1 capsule (300 mg total) by mouth 3 (three) times daily. 21 capsule 0  . DULoxetine (CYMBALTA) 60 MG capsule TAKE ONE CAPSULE BY MOUTH DAILY 90 capsule 0  . valACYclovir (VALTREX) 500 MG tablet Take 1 tablet (500 mg total) by mouth daily. 90 tablet 1   No current facility-administered medications on file prior to visit.     BP 120/82   Pulse 85   Temp 98.7 F (37.1 C) (Oral)   Ht 5\' 5"  (1.651 m)   Wt 200 lb 3.2 oz (90.8 kg)   LMP 09/26/2016   SpO2 98%   BMI 33.32 kg/m  Objective:   Physical Exam  Constitutional: She appears well-nourished.  Neck: Neck supple.  Cardiovascular: Normal rate and regular rhythm.   Pulmonary/Chest: Effort normal and breath sounds normal.  Skin: Skin is warm and dry.  Psychiatric: She has a normal mood and affect.          Assessment & Plan:  Vaginal Itching:  Present for the past 2-3 days. Likely antibiotic induced yeast infection given numerous antibiotics within a short time frame. Rx for fluconazole 150 mg tablet sent to pharmacy. She will update if no improvement.  Morrie Sheldon, NP

## 2016-10-27 ENCOUNTER — Ambulatory Visit (INDEPENDENT_AMBULATORY_CARE_PROVIDER_SITE_OTHER): Admitting: Psychology

## 2016-10-27 DIAGNOSIS — F331 Major depressive disorder, recurrent, moderate: Secondary | ICD-10-CM | POA: Diagnosis not present

## 2016-12-04 ENCOUNTER — Ambulatory Visit: Payer: Self-pay | Admitting: Psychology

## 2017-01-01 ENCOUNTER — Ambulatory Visit: Payer: Self-pay | Admitting: Psychology

## 2017-01-05 ENCOUNTER — Other Ambulatory Visit: Payer: Self-pay | Admitting: Primary Care

## 2017-01-05 ENCOUNTER — Encounter: Payer: Self-pay | Admitting: Primary Care

## 2017-01-05 DIAGNOSIS — F329 Major depressive disorder, single episode, unspecified: Secondary | ICD-10-CM

## 2017-01-05 DIAGNOSIS — F419 Anxiety disorder, unspecified: Principal | ICD-10-CM

## 2017-01-10 MED ORDER — BUPROPION HCL ER (XL) 150 MG PO TB24: 150.0000 mg | ORAL_TABLET | Freq: Every day | ORAL | 0 refills | Status: DC

## 2017-01-10 NOTE — Telephone Encounter (Signed)
Nichole Mcbride      3:58 PM  I'm doing great! It's definitely helped adding the Wellbutrin.    Pt reports she is doing well on medication--please advise if okay to refill and how many refills to send with it

## 2017-01-15 ENCOUNTER — Ambulatory Visit: Admitting: Psychology

## 2017-01-16 ENCOUNTER — Encounter: Payer: Self-pay | Admitting: Primary Care

## 2017-01-16 DIAGNOSIS — B001 Herpesviral vesicular dermatitis: Secondary | ICD-10-CM

## 2017-01-17 MED ORDER — VALACYCLOVIR HCL 500 MG PO TABS: 500.0000 mg | ORAL_TABLET | Freq: Every day | ORAL | 1 refills | Status: DC

## 2017-01-23 ENCOUNTER — Encounter: Payer: Self-pay | Admitting: Obstetrics and Gynecology

## 2017-01-23 ENCOUNTER — Ambulatory Visit (INDEPENDENT_AMBULATORY_CARE_PROVIDER_SITE_OTHER): Admitting: Obstetrics and Gynecology

## 2017-01-23 VITALS — BP 118/70 | Ht 65.0 in | Wt 202.0 lb

## 2017-01-23 DIAGNOSIS — Z113 Encounter for screening for infections with a predominantly sexual mode of transmission: Secondary | ICD-10-CM | POA: Diagnosis not present

## 2017-01-23 DIAGNOSIS — Z01419 Encounter for gynecological examination (general) (routine) without abnormal findings: Secondary | ICD-10-CM | POA: Diagnosis not present

## 2017-01-23 DIAGNOSIS — Z124 Encounter for screening for malignant neoplasm of cervix: Secondary | ICD-10-CM | POA: Diagnosis not present

## 2017-01-23 DIAGNOSIS — Z30018 Encounter for initial prescription of other contraceptives: Secondary | ICD-10-CM

## 2017-01-23 NOTE — Progress Notes (Signed)
Gynecology Annual Exam  PCP: Morrie Sheldon, NP  Chief Complaint  Patient presents with  . Annual Exam    History of Present Illness:  [name] is a 31 y.o. [id number] who LMP was Patient's last menstrual period was 12/27/2016., presents today for her annual examination.  Her menses are regular every 28-30 days, lasting 4 day(s).  Dysmenorrhea none. She does not have intermenstrual bleeding.  She is not sexually active.  Last Pap: she does not remember Hx of STDs: distant h/o gonorrhea, treated.   Last mammogram: none There is no FH of breast cancer. There is no FH of ovarian cancer. The patient does not do self-breast exams.  Tobacco use: The patient denies current or previous tobacco use. Alcohol use: social drinker Exercise: not active  The patient wears seatbelts: yes.    She is recently divorced. She is now in a new relationship and would like to discuss contraception, too. She also requests baseline STD testing.  Review of Systems: Review of Systems  Constitutional: Negative.   HENT: Negative.   Eyes: Negative.   Respiratory: Negative.   Cardiovascular: Negative.   Gastrointestinal: Negative.   Genitourinary: Negative.   Musculoskeletal: Negative.   Skin: Negative.   Neurological: Negative.   Psychiatric/Behavioral: Negative.     Past Medical History:  Diagnosis Date  . DEPRESSION 06/10/2008  . INSOMNIA 06/10/2008  . Migraine headache   . Raynaud phenomenon   . SCOLIOSIS 06/10/2008  . Sjogren's disease (HCC)     Past Surgical History:  None  Medications:   Medication Sig Start Date End Date Taking? Authorizing Provider  buPROPion (WELLBUTRIN XL) 150 MG 24 hr tablet Take 1 tablet (150 mg total) by mouth daily. 01/10/17  Yes Doreene Nest, NP  DULoxetine (CYMBALTA) 60 MG capsule TAKE ONE CAPSULE BY MOUTH DAILY 08/02/16  Yes Doreene Nest, NP  valACYclovir (VALTREX) 500 MG tablet Take 1 tablet (500 mg total) by mouth daily. 01/17/17   Yes Doreene Nest, NP    Allergies  Allergen Reactions  . Ciprofloxacin Hcl     "bad reaction"  . Imitrex [Sumatriptan Succinate]     Gynecologic History: Patient's last menstrual period was 12/27/2016.  Obstetric History: G2P2002, s/p SVD x 2, G2 complicated by intrahepatic cholestasis of pregnancy.   Social History   Social History  . Marital status: Married    Spouse name: N/A  . Number of children: N/A  . Years of education: N/A   Occupational History  . Not on file.   Social History Main Topics  . Smoking status: Current Some Day Smoker  . Smokeless tobacco: Never Used  . Alcohol use Yes  . Drug use: No  . Sexual activity: Not Currently    Birth control/ protection: None   Other Topics Concern  . Not on file   Social History Narrative   Married.   2 children.   Teaches English.   Enjoys spending time with family.    Family History  Problem Relation Age of Onset  . Diabetes Mother   . Hypertension Mother   . Hypothyroidism Mother   . Hypertension Father   . Hypothyroidism Father      Physical Exam BP 118/70   Ht  (1.651 m)   Wt 202 lb (91.6 kg)   LMP 12/27/2016   BMI 33.61 kg/m   Physical Exam  Constitutional: She is oriented to person, place, and time. She appears well-developed and well-nourished. No distress.  Genitourinary: Uterus  normal. Pelvic exam was performed with patient supine. There is no rash, tenderness, lesion or injury on the right labia. There is no rash, tenderness, lesion or injury on the left labia. No erythema, tenderness or bleeding in the vagina. No signs of injury around the vagina. No vaginal discharge found. Right adnexum does not display mass, does not display tenderness and does not display fullness. Left adnexum does not display mass, does not display tenderness and does not display fullness. Cervix does not exhibit motion tenderness or discharge.   Uterus is mobile and anteverted. Uterus is not enlarged,  tender or exhibiting a mass.  HENT:  Head: Normocephalic and atraumatic.  Eyes: EOM are normal. No scleral icterus.  Neck: Normal range of motion. Neck supple. No thyromegaly present.  Cardiovascular: Normal rate and regular rhythm.  Exam reveals no gallop and no friction rub.   No murmur heard. Pulmonary/Chest: Effort normal and breath sounds normal. No respiratory distress. She has no wheezes. She has no rales. Right breast exhibits no inverted nipple, no mass, no nipple discharge, no skin change and no tenderness. Left breast exhibits no inverted nipple, no mass, no nipple discharge, no skin change and no tenderness.  Abdominal: Soft. Bowel sounds are normal. She exhibits no distension and no mass. There is no tenderness. There is no rebound and no guarding.  Musculoskeletal: Normal range of motion. She exhibits no edema or tenderness.  Lymphadenopathy:    She has no cervical adenopathy.       Right: No inguinal adenopathy present.       Left: No inguinal adenopathy present.  Neurological: She is alert and oriented to person, place, and time. No cranial nerve deficit.  Skin: Skin is warm and dry. No rash noted. No erythema.  Psychiatric: She has a normal mood and affect. Her behavior is normal. Judgment normal.    Female chaperone present for pelvic and breast  portions of the physical exam  Assessment: 30 y.o. [id number] here for annual gynecologic examination, doing well. Would like to discuss non-hormonal contraception.   Plan: Problem List Items Addressed This Visit    None    Visit Diagnoses    Women's annual routine gynecological examination    -  Primary   Relevant Orders   IGP, CtNg, rfx Aptima HPV ASCU   Screen for STD (sexually transmitted disease)       Relevant Orders   IGP, CtNg, rfx Aptima HPV ASCU   Pap smear for cervical cancer screening       Encounter for initial prescription of other contraceptives         Screening: -- Blood pressure screen normal. --  Colonoscopy - n/a -- Mammogram - not due -- Weight screening: normal -- Nutrition: normal -- cholesterol screening: n/a -- osteoporosis screening: n/a -- tobacco screening: not using -- alcohol screening: AUDIT questionnaire indicates low-risk usage. -- family history of breast cancer screening: done. not at high risk. -- no evidence of domestic violence or intimate partner violence. -- STD screening: gonorrhea/chlamydia NAAT collected. -- pap smear collected.  Contraception: Reviewed all forms of birth control options available including abstinence; over the counter/barrier methods; hormonal contraceptive medication including pill, patch, ring, injection,contraceptive implant; hormonal and nonhormonal IUDs; permanent sterilization options including vasectomy and the various tubal sterilization modalities. Risks and benefits reviewed.  Questions were answered.  Information was given to patient to review. She would like to go with Paragard. Discussed it in detail.   Will order Paragard and having her follow  up for placement soon.  Thomasene Mohair, MD 01/23/2017 5:32 PM

## 2017-01-26 LAB — IGP, CTNG, RFX APTIMA HPV ASCU
CHLAMYDIA, NUC. ACID AMP: NEGATIVE
GONOCOCCUS BY NUCLEIC ACID AMP: NEGATIVE
PAP Smear Comment: 0

## 2017-01-29 ENCOUNTER — Ambulatory Visit: Admitting: Psychology

## 2017-02-13 ENCOUNTER — Encounter: Payer: Self-pay | Admitting: Obstetrics and Gynecology

## 2017-02-14 ENCOUNTER — Ambulatory Visit: Admitting: Obstetrics and Gynecology

## 2017-03-26 ENCOUNTER — Other Ambulatory Visit: Payer: Self-pay | Admitting: Primary Care

## 2017-03-26 DIAGNOSIS — F329 Major depressive disorder, single episode, unspecified: Secondary | ICD-10-CM

## 2017-03-26 DIAGNOSIS — F419 Anxiety disorder, unspecified: Principal | ICD-10-CM

## 2017-03-26 MED ORDER — BUPROPION HCL ER (XL) 150 MG PO TB24: 150.0000 mg | ORAL_TABLET | Freq: Every day | ORAL | 1 refills | Status: DC

## 2017-03-26 MED ORDER — DULOXETINE HCL 60 MG PO CPEP: 60.0000 mg | ORAL_CAPSULE | Freq: Every day | ORAL | 1 refills | Status: DC

## 2017-04-05 ENCOUNTER — Ambulatory Visit (INDEPENDENT_AMBULATORY_CARE_PROVIDER_SITE_OTHER): Admitting: Primary Care

## 2017-04-05 ENCOUNTER — Encounter: Payer: Self-pay | Admitting: Primary Care

## 2017-04-05 VITALS — BP 118/80 | HR 95 | Temp 98.1°F | Ht 65.0 in | Wt 203.0 lb

## 2017-04-05 DIAGNOSIS — M7989 Other specified soft tissue disorders: Secondary | ICD-10-CM

## 2017-04-05 DIAGNOSIS — L659 Nonscarring hair loss, unspecified: Secondary | ICD-10-CM

## 2017-04-05 LAB — CBC
HCT: 40.6 % (ref 36.0–46.0)
Hemoglobin: 13.5 g/dL (ref 12.0–15.0)
MCHC: 33.2 g/dL (ref 30.0–36.0)
MCV: 85.7 fl (ref 78.0–100.0)
Platelets: 339 10*3/uL (ref 150.0–400.0)
RBC: 4.74 Mil/uL (ref 3.87–5.11)
RDW: 14.9 % (ref 11.5–15.5)
WBC: 9 10*3/uL (ref 4.0–10.5)

## 2017-04-05 LAB — COMPREHENSIVE METABOLIC PANEL
ALBUMIN: 4.3 g/dL (ref 3.5–5.2)
ALK PHOS: 95 U/L (ref 39–117)
ALT: 24 U/L (ref 0–35)
AST: 23 U/L (ref 0–37)
BILIRUBIN TOTAL: 0.4 mg/dL (ref 0.2–1.2)
BUN: 13 mg/dL (ref 6–23)
CO2: 29 mEq/L (ref 19–32)
CREATININE: 0.95 mg/dL (ref 0.40–1.20)
Calcium: 9.9 mg/dL (ref 8.4–10.5)
Chloride: 102 mEq/L (ref 96–112)
GFR: 73.78 mL/min (ref >60.00)
Glucose, Bld: 108 mg/dL — ABNORMAL HIGH (ref 70–99)
Potassium: 4.2 mEq/L (ref 3.5–5.1)
SODIUM: 137 meq/L (ref 135–145)
TOTAL PROTEIN: 7.3 g/dL (ref 6.0–8.3)

## 2017-04-05 LAB — VITAMIN D 25 HYDROXY (VIT D DEFICIENCY, FRACTURES): VITD: 28.96 ng/mL — ABNORMAL LOW (ref 30.00–100.00)

## 2017-04-05 LAB — TSH: TSH: 1.41 u[IU]/mL (ref 0.35–4.50)

## 2017-04-05 LAB — HEMOGLOBIN A1C: HEMOGLOBIN A1C: 6 % (ref 4.6–6.5)

## 2017-04-05 NOTE — Progress Notes (Signed)
Subjective:    Patient ID: Nichole Mcbride, female    DOB: 01/16/1986, 31 y.o.   MRN: [id number]  HPI  Nichole Mcbride is a 31 year old female who presents today with multiple complaints.  1) Hair Loss: Two weeks ago she noticed hair loss and believes she's lost a lot of hair loss. She's pulled out clumps/hand fulls of hair in the showers. She denies changes in soaps, detergents, medications, foods; she denies dry/itchy scalp. She does not blow dry or flat iron her hair. Family history of thyroid disease in her mother.  2) Extremity Swelling: Locate to the lower legs/feet that she noticed when waking up in the morning, improved during the day. This started about 3 weeks ago. Two weeks ago she noticed swelling to her hands with tightness to her wrists and forearms. She will first notice hand swelling in the morning that is improved throughout the day. She does experience numbness/tingling to her hands that is daily and most everyday. Diagnosed with carpel tunnel 6-8 weeks ago. Also a history of Raynauds. She drinks water mostly and will drink 3/4 gallon of water daily.   Review of Systems  Respiratory: Negative for shortness of breath.   Cardiovascular: Negative for chest pain and leg swelling.  Skin: Negative for rash.       Hair loss  Neurological: Positive for numbness.       Past Medical History:  Diagnosis Date  . DEPRESSION 06/10/2008  . INSOMNIA 06/10/2008  . Migraine headache   . Raynaud phenomenon   . SCOLIOSIS 06/10/2008  . Sjogren's disease [facility](HCC)      Social History   Social History  . Marital status: Married    Spouse name: N/A  . Number of children: N/A  . Years of education: N/A   Occupational History  . Not on file.   Social History Main Topics  . Smoking status: Current Some Day Smoker  . Smokeless tobacco: Never Used  . Alcohol use Yes  . Drug use: No  . Sexual activity: Not Currently    Birth control/ protection: None   Other Topics Concern  . Not  on file   Social History Narrative   Married.   2 children.   Teaches English.   Enjoys spending time with family.    No past surgical history on file.  Family History  Problem Relation Age of Onset  . Diabetes Mother   . Hypertension Mother   . Hypothyroidism Mother   . Hypertension Father   . Hypothyroidism Father     Allergies  Allergen Reactions  . Ciprofloxacin Hcl     "bad reaction"  . Imitrex [Sumatriptan Succinate]     Current Outpatient Prescriptions on File Prior to Visit  Medication Sig Dispense Refill  . buPROPion (WELLBUTRIN XL) 150 MG 24 hr tablet Take 1 tablet (150 mg total) by mouth daily. 90 tablet 1  . DULoxetine (CYMBALTA) 60 MG capsule Take 1 capsule (60 mg total) by mouth daily. 90 capsule 1  . valACYclovir (VALTREX) 500 MG tablet Take 1 tablet (500 mg total) by mouth daily. (Patient not taking: Reported on 04/05/2017) 90 tablet 1   No current facility-administered medications on file prior to visit.     BP 118/80   Pulse 95   Temp 98.1 F (36.7 C) (Oral)   Ht 5\' 5"  (1.651 m)   Wt 203 lb (92.1 kg)   LMP 03/22/2017   SpO2 98%   BMI 33.78 kg/m  Objective:   Physical Exam  Constitutional: She appears well-nourished.  Neck: Neck supple.  Cardiovascular: Normal rate and regular rhythm.   Pulses:      Dorsalis pedis pulses are 2+ on the right side, and 2+ on the left side.       Posterior tibial pulses are 2+ on the right side, and 2+ on the left side.  No lower or upper extremity edema noted.  Pulmonary/Chest: Effort normal and breath sounds normal.  Neurological:  Positive Phalen's sign to right hand.  Skin: Skin is warm and dry.  Scalp appears normal. No bald patches noted.  Psychiatric: She has a normal mood and affect.          Assessment & Plan:  Extremity Swelling:  No obvious edema today. 2+ DP and PT's. Could be secondary to hot weather.  Check electrolytes and TSH today. Discussed to elevate legs in the evening, wear  wrist braces for carpal tunnel symptoms.  Hair Loss:  Present for 2 weeks, coming out in "handfuls" in the shower. Exam today without evidence of scalp irritation/rash/fungal involvement. No balding spots. Will check TSH today given mother's positive history. Discussed to use mild shampoos/soaps, no blow drying or flat ironing hair.   Morrie Sheldon, NP

## 2017-04-05 NOTE — Patient Instructions (Signed)
Complete lab work prior to leaving today.   Elevated your legs at night when resting.  Consider wearing shoes with extra insoles when up on your feet.  Wear the arm braces as discussed for carpal tunnel.  Do not blow try or flat iron your hair. Use mild shampoos.  I'll be in touch with you once I receive your lab results.  It was a pleasure to see you today!

## 2017-04-06 ENCOUNTER — Other Ambulatory Visit (INDEPENDENT_AMBULATORY_CARE_PROVIDER_SITE_OTHER)

## 2017-04-06 DIAGNOSIS — L659 Nonscarring hair loss, unspecified: Secondary | ICD-10-CM

## 2017-04-06 DIAGNOSIS — M7989 Other specified soft tissue disorders: Secondary | ICD-10-CM | POA: Diagnosis not present

## 2017-04-06 LAB — VITAMIN B12: VITAMIN B 12: 337 pg/mL (ref 211–911)

## 2017-06-04 ENCOUNTER — Encounter: Payer: Self-pay | Admitting: Primary Care

## 2017-06-04 ENCOUNTER — Ambulatory Visit: Payer: Self-pay | Admitting: Primary Care

## 2017-07-09 ENCOUNTER — Other Ambulatory Visit: Payer: Self-pay | Admitting: Primary Care

## 2017-07-09 ENCOUNTER — Ambulatory Visit (INDEPENDENT_AMBULATORY_CARE_PROVIDER_SITE_OTHER): Admitting: Maternal Newborn

## 2017-07-09 ENCOUNTER — Encounter: Payer: Self-pay | Admitting: Maternal Newborn

## 2017-07-09 VITALS — BP 120/84 | Wt 194.0 lb

## 2017-07-09 DIAGNOSIS — Z6831 Body mass index (BMI) 31.0-31.9, adult: Secondary | ICD-10-CM | POA: Insufficient documentation

## 2017-07-09 DIAGNOSIS — N926 Irregular menstruation, unspecified: Secondary | ICD-10-CM | POA: Diagnosis not present

## 2017-07-09 DIAGNOSIS — Z3480 Encounter for supervision of other normal pregnancy, unspecified trimester: Secondary | ICD-10-CM | POA: Insufficient documentation

## 2017-07-09 DIAGNOSIS — E569 Vitamin deficiency, unspecified: Secondary | ICD-10-CM

## 2017-07-09 HISTORY — DX: Encounter for supervision of other normal pregnancy, unspecified trimester: Z34.80

## 2017-07-09 LAB — POCT URINE PREGNANCY: PREG TEST UR: POSITIVE — AB

## 2017-07-09 NOTE — Progress Notes (Signed)
07/09/2017   Chief Complaint: Amenorrhea, positive home pregnancy test, desires prenatal care.  Transfer of Care Patient: no  History of Present Illness: [name] is a 31 y.o. G2P1001 [redacted]w[redacted]d based on patient's last menstrual period on 06/06/2017 (exact date), with an Estimated Date of Delivery: 03/13/18, with the above CC.   Her periods were: regular periods every 28-30 days, lasting 3-4 days. She was using condoms when she conceived.  She has no nausea/vomiting of pregnancy. She has Negative signs or symptoms of miscarriage or preterm labor. She identifies unknown Zika risk factors for her and her partner - partner travels for [employer]; she will ask him if he has been to affected areas. On any different medications around the time she conceived/early pregnancy: No, continues on Cymbalta, Wellbutrin, Valtrex. Briefly discussed antidepressants in pregnancy and she wishes to stay on her current medications; declines any outside medication counseling at this time.  History of varicella: No   ROS: A 12-point review of systems was performed and negative, except as stated in the above HPI.  OBGYN History: As per HPI. OB History  Gravida Para Term Preterm AB Living  0 0 2  SAB TAB Ectopic Multiple Live Births          1    # Outcome Date GA Lbr Len/2nd Weight Sex Delivery Anes PTL Lv  3 Current           2 Term 09/24/09   6 lb 13 oz (3.09 kg) M Vag-Spont   LIV  1 Term              G1 and G2 were both SVD. Any issues with any prior pregnancies: ICP with G2. Any prior children are healthy, doing well, without any problems or issues: yes History of pap smears: Yes. Last pap smear 01/23/2017. NILM. History of STIs: Yes, remote history of chlamydia. Herpes, currently taking Valtrex.   Past Medical History: Past Medical History:  Diagnosis Date  . Carpal tunnel syndrome    bilaterally  . DEPRESSION 06/10/2008  . INSOMNIA 06/10/2008  . Migraine headache   . Raynaud phenomenon   .  SCOLIOSIS 06/10/2008  . Sjogren's disease [facility])     Past Surgical History: Past Surgical History:  Procedure Laterality Date  . INTRAUTERINE DEVICE INSERTION  11/05/2009    Family History:  Family History  Problem Relation Age of Onset  . Diabetes Mother   . Hypertension Mother   . Hypothyroidism Mother   . Hypertension Father   . Hypothyroidism Father    She denies any female cancers, bleeding or blood clotting disorders.  She denies any history of intellectual disability, birth defects or genetic disorders in her or the FOB's history.  Social History:  Social History   Social History  . Marital status: Married    Spouse name: N/A  . Number of children: N/A  . Years of education: N/A   Occupational History  . Not on file.   Social History Main Topics  . Smoking status: Current Some Day Smoker  . Smokeless tobacco: Never Used  . Alcohol use Yes  . Drug use: No  . Sexual activity: Not Currently    Birth control/ protection: None   Other Topics Concern  . Not on file   Social History Narrative   Divorced   2 children.   Teaches English.   Enjoys spending time with family.   Any cats in the household: no Denies history of and current domestic  violence.  Allergy: Allergies  Allergen Reactions  . Ciprofloxacin Hcl     "bad reaction"  . Imitrex [Sumatriptan Succinate]     Current Outpatient Medications:  Current Outpatient Prescriptions:  .  buPROPion (WELLBUTRIN XL) 150 MG 24 hr tablet, Take 1 tablet (150 mg total) by mouth daily., Disp: 90 tablet, Rfl: 1 .  DULoxetine (CYMBALTA) 60 MG capsule, Take 1 capsule (60 mg total) by mouth daily., Disp: 90 capsule, Rfl: 1 .  valACYclovir (VALTREX) 500 MG tablet, Take 1 tablet (500 mg total) by mouth daily., Disp: 90 tablet, Rfl: 1   Physical Exam:   BP 120/84   Wt 194 lb (88 kg)   LMP 06/06/2017 (Exact Date)   BMI 32.28 kg/m  Body mass index is 32.28 kg/m. Constitutional: Well nourished, well developed  female in no acute distress.  Neck:  Supple, normal appearance, and no thyromegaly  Cardiovascular: S1, S2 normal, no murmur, rub or gallop, regular rate and rhythm Respiratory:  Clear to auscultation bilateral. Normal respiratory effort Abdomen: positive bowel sounds and no masses, hernias; diffusely non tender to palpation, non distended Breasts: breasts appear normal, no suspicious masses, no skin or nipple changes or axillary nodes. Neuro/Psych:  Normal mood and affect.  Skin:  Warm and dry.  Lymphatic:  No inguinal lymphadenopathy.   Pelvic exam: is not limited by body habitus External genitalia, Bartholin's glands, Urethra, Skene's glands: within normal limits Vagina: within normal limits and with no blood in the vault  Cervix: normal appearing cervix without discharge or lesions, closed/long/high Uterus:  enlarged, c/w early pregnancy Adnexa:  positive for: tenderness on right side, negative for tenderness on left side; no masses palpated  Assessment: [name] is a 31 y.o. G2P1001 [redacted]w[redacted]d based on Patient's last menstrual period on 06/06/2017 (exact date), with an Estimated Date of Delivery: 03/13/18, presenting for prenatal care.  Plan:  1) Avoid alcoholic beverages. 2) Patient encouraged not to smoke.  3) Discontinue the use of all non-medicinal drugs and chemicals.  4) Take prenatal vitamins daily.  5) Seatbelt use advised. 6) Nutrition, food safety (fish, cheese advisories, and high nitrite foods) and exercise discussed. 7) Hospital and practice style delivering at [facility] discussed  8) Patient is asked about travel to areas at risk for the Zika virus, and counseled to avoid travel and exposure to mosquitoes or sexual partners who may have themselves been exposed to the virus. Testing is discussed, and will be ordered as appropriate.  9) Childbirth classes at [facility] advised. 10) Genetic Screening, such as with 1st Trimester Screening, cell free fetal DNA, AFP testing, and  Ultrasound, as well as with amniocentesis and CVS as appropriate, is discussed with patient. She is undecided about genetic testing this pregnancy.  Problem list reviewed and updated.  Return in about 1 week (around 07/16/2017) for ROB folowing ultrasound.  Marcelyn Bruins, CNM [facility], [facility] 07/09/2017  12:23 PM

## 2017-07-09 NOTE — Patient Instructions (Signed)
First Trimester of Pregnancy The first trimester of pregnancy is from week 1 until the end of week 13 (months 1 through 3). A week after a sperm fertilizes an egg, the egg will implant on the wall of the uterus. This embryo will begin to develop into a baby. Genes from you and your partner will form the baby. The female genes will determine whether the baby will be a boy or a girl. At 6-8 weeks, the eyes and face will be formed, and the heartbeat can be seen on ultrasound. At the end of 12 weeks, all the baby's organs will be formed. Now that you are pregnant, you will want to do everything you can to have a healthy baby. Two of the most important things are to get good prenatal care and to follow your health care provider's instructions. Prenatal care is all the medical care you receive before the baby's birth. This care will help prevent, find, and treat any problems during the pregnancy and childbirth. Body changes during your first trimester Your body goes through many changes during pregnancy. The changes vary from woman to woman.  You may gain or lose a couple of pounds at first.  You may feel sick to your stomach (nauseous) and you may throw up (vomit). If the vomiting is uncontrollable, call your health care provider.  You may tire easily.  You may develop headaches that can be relieved by medicines. All medicines should be approved by your health care provider.  You may urinate more often. Painful urination may mean you have a bladder infection.  You may develop heartburn as a result of your pregnancy.  You may develop constipation because certain hormones are causing the muscles that push stool through your intestines to slow down.  You may develop hemorrhoids or swollen veins (varicose veins).  Your breasts may begin to grow larger and become tender. Your nipples may stick out more, and the tissue that surrounds them (areola) may become darker.  Your gums may bleed and may be  sensitive to brushing and flossing.  Dark spots or blotches (chloasma, mask of pregnancy) may develop on your face. This will likely fade after the baby is born.  Your menstrual periods will stop.  You may have a loss of appetite.  You may develop cravings for certain kinds of food.  You may have changes in your emotions from day to day, such as being excited to be pregnant or being concerned that something may go wrong with the pregnancy and baby.  You may have more vivid and strange dreams.  You may have changes in your hair. These can include thickening of your hair, rapid growth, and changes in texture. Some women also have hair loss during or after pregnancy, or hair that feels dry or thin. Your hair will most likely return to normal after your baby is born.  What to expect at prenatal visits During a routine prenatal visit:  You will be weighed to make sure you and the baby are growing normally.  Your blood pressure will be taken.  Your abdomen will be measured to track your baby's growth.  The fetal heartbeat will be listened to between weeks 10 and 14 of your pregnancy.  Test results from any previous visits will be discussed.  Your health care provider may ask you:  How you are feeling.  If you are feeling the baby move.  If you have had any abnormal symptoms, such as leaking fluid, bleeding, severe headaches,   or abdominal cramping.  If you are using any tobacco products, including cigarettes, chewing tobacco, and electronic cigarettes.  If you have any questions.  Other tests that may be performed during your first trimester include:  Blood tests to find your blood type and to check for the presence of any previous infections. The tests will also be used to check for low iron levels (anemia) and protein on red blood cells (Rh antibodies). Depending on your risk factors, or if you previously had diabetes during pregnancy, you may have tests to check for high blood  sugar that affects pregnant women (gestational diabetes).  Urine tests to check for infections, diabetes, or protein in the urine.  An ultrasound to confirm the proper growth and development of the baby.  Fetal screens for spinal cord problems (spina bifida) and Down syndrome.  HIV (human immunodeficiency virus) testing. Routine prenatal testing includes screening for HIV, unless you choose not to have this test.  You may need other tests to make sure you and the baby are doing well.  Follow these instructions at home: Medicines  Follow your health care provider's instructions regarding medicine use. Specific medicines may be either safe or unsafe to take during pregnancy.  Take a prenatal vitamin that contains at least 600 micrograms (mcg) of folic acid.  If you develop constipation, try taking a stool softener if your health care provider approves. Eating and drinking  Eat a balanced diet that includes fresh fruits and vegetables, whole grains, good sources of protein such as meat, eggs, or tofu, and low-fat dairy. Your health care provider will help you determine the amount of weight gain that is right for you.  Avoid raw meat and uncooked cheese. These carry germs that can cause birth defects in the baby.  Eating four or five small meals rather than three large meals a day may help relieve nausea and vomiting. If you start to feel nauseous, eating a few soda crackers can be helpful. Drinking liquids between meals, instead of during meals, also seems to help ease nausea and vomiting.  Limit foods that are high in fat and processed sugars, such as fried and sweet foods.  To prevent constipation: ? Eat foods that are high in fiber, such as fresh fruits and vegetables, whole grains, and beans. ? Drink enough fluid to keep your urine clear or pale yellow. Activity  Exercise only as directed by your health care provider. Most women can continue their usual exercise routine during  pregnancy. Try to exercise for 30 minutes at least 5 days a week. Exercising will help you: ? Control your weight. ? Stay in shape. ? Be prepared for labor and delivery.  Experiencing pain or cramping in the lower abdomen or lower back is a good sign that you should stop exercising. Check with your health care provider before continuing with normal exercises.  Try to avoid standing for long periods of time. Move your legs often if you must stand in one place for a long time.  Avoid heavy lifting.  Wear low-heeled shoes and practice good posture.  You may continue to have sex unless your health care provider tells you not to. Relieving pain and discomfort  Wear a good support bra to relieve breast tenderness.  Take warm sitz baths to soothe any pain or discomfort caused by hemorrhoids. Use hemorrhoid cream if your health care provider approves.  Rest with your legs elevated if you have leg cramps or low back pain.  If you develop   varicose veins in your legs, wear support hose. Elevate your feet for 15 minutes, 3-4 times a day. Limit salt in your diet. Prenatal care  Schedule your prenatal visits by the twelfth week of pregnancy. They are usually scheduled monthly at first, then more often in the last 2 months before delivery.  Write down your questions. Take them to your prenatal visits.  Keep all your prenatal visits as told by your health care provider. This is important. Safety  Wear your seat belt at all times when driving.  Make a list of emergency phone numbers, including numbers for family, friends, the hospital, and police and fire departments. General instructions  Ask your health care provider for a referral to a local prenatal education class. Begin classes no later than the beginning of month 6 of your pregnancy.  Ask for help if you have counseling or nutritional needs during pregnancy. Your health care provider can offer advice or refer you to specialists for help  with various needs.  Do not use hot tubs, steam rooms, or saunas.  Do not douche or use tampons or scented sanitary pads.  Do not cross your legs for long periods of time.  Avoid cat litter boxes and soil used by cats. These carry germs that can cause birth defects in the baby and possibly loss of the fetus by miscarriage or stillbirth.  Avoid all smoking, herbs, alcohol, and medicines not prescribed by your health care provider. Chemicals in these products affect the formation and growth of the baby.  Do not use any products that contain nicotine or tobacco, such as cigarettes and e-cigarettes. If you need help quitting, ask your health care provider. You may receive counseling support and other resources to help you quit.  Schedule a dentist appointment. At home, brush your teeth with a soft toothbrush and be gentle when you floss. Contact a health care provider if:  You have dizziness.  You have mild pelvic cramps, pelvic pressure, or nagging pain in the abdominal area.  You have persistent nausea, vomiting, or diarrhea.  You have a bad smelling vaginal discharge.  You have pain when you urinate.  You notice increased swelling in your face, hands, legs, or ankles.  You are exposed to fifth disease or chickenpox.  You are exposed to German measles (rubella) and have never had it. Get help right away if:  You have a fever.  You are leaking fluid from your vagina.  You have spotting or bleeding from your vagina.  You have severe abdominal cramping or pain.  You have rapid weight gain or loss.  You vomit blood or material that looks like coffee grounds.  You develop a severe headache.  You have shortness of breath.  You have any kind of trauma, such as from a fall or a car accident. Summary  The first trimester of pregnancy is from week 1 until the end of week 13 (months 1 through 3).  Your body goes through many changes during pregnancy. The changes vary from  woman to woman.  You will have routine prenatal visits. During those visits, your health care provider will examine you, discuss any test results you may have, and talk with you about how you are feeling. This information is not intended to replace advice given to you by your health care provider. Make sure you discuss any questions you have with your health care provider. Document Released: 09/05/2001 Document Revised: 08/23/2016 Document Reviewed: 08/23/2016 Elsevier Interactive Patient Education  2017 Elsevier   Inc.  

## 2017-07-10 LAB — RPR+RH+ABO+RUB AB+AB SCR+CB...
ANTIBODY SCREEN: NEGATIVE
HEMOGLOBIN: 13.1 g/dL (ref 11.1–15.9)
HIV SCREEN 4TH GENERATION: NONREACTIVE
Hematocrit: 40.8 % (ref 34.0–46.6)
Hepatitis B Surface Ag: NEGATIVE
MCH: 27.6 pg (ref 26.6–33.0)
MCHC: 32.1 g/dL (ref 31.5–35.7)
MCV: 86 fL (ref 79–97)
PLATELETS: 334 10*3/uL (ref 150–379)
RBC: 4.75 x10E6/uL (ref 3.77–5.28)
RDW: 15.5 % — ABNORMAL HIGH (ref 12.3–15.4)
RPR: NONREACTIVE
RUBELLA: 1.89 {index} (ref >0.99)
Rh Factor: POSITIVE
Varicella zoster IgG: 3164 index (ref >165)
WBC: 7.6 10*3/uL (ref 3.4–10.8)

## 2017-07-11 LAB — GC/CHLAMYDIA PROBE AMP
Chlamydia trachomatis, NAA: NEGATIVE
NEISSERIA GONORRHOEAE BY PCR: NEGATIVE

## 2017-07-11 LAB — URINE DRUG PANEL 7
Amphetamines, Urine: NEGATIVE ng/mL
BARBITURATE QUANT UR: NEGATIVE ng/mL
BENZODIAZEPINE QUANT UR: NEGATIVE ng/mL
Cannabinoid Quant, Ur: NEGATIVE ng/mL
Cocaine (Metab.): NEGATIVE ng/mL
OPIATE QUANT UR: NEGATIVE ng/mL
PCP QUANT UR: NEGATIVE ng/mL

## 2017-07-11 LAB — URINE CULTURE: ORGANISM ID, BACTERIA: NO GROWTH

## 2017-07-16 ENCOUNTER — Other Ambulatory Visit: Payer: Self-pay

## 2017-08-13 ENCOUNTER — Telehealth: Payer: Self-pay | Admitting: Maternal Newborn

## 2017-08-13 ENCOUNTER — Other Ambulatory Visit: Payer: Self-pay | Admitting: Primary Care

## 2017-08-13 DIAGNOSIS — B001 Herpesviral vesicular dermatitis: Secondary | ICD-10-CM

## 2017-08-13 NOTE — Telephone Encounter (Signed)
Attempt to reach patient voicemail box not set up. Pt needs an Follow up appt due to pt not being seen since 07/09/17.

## 2017-08-13 NOTE — Telephone Encounter (Signed)
Electronic refill request. Valcyclovir Last office visit:   04/05/17 Last Filled:    90 tablet 1 01/17/2017  Please advise.

## 2017-08-14 NOTE — Telephone Encounter (Signed)
Please notify patient that she'll be due for a follow up visit in January 2019 for any further medication refils, would recommend CPE.

## 2017-08-14 NOTE — Telephone Encounter (Signed)
Patient advised.

## 2017-10-23 ENCOUNTER — Other Ambulatory Visit: Payer: Self-pay | Admitting: Primary Care

## 2017-10-23 DIAGNOSIS — F329 Major depressive disorder, single episode, unspecified: Secondary | ICD-10-CM

## 2017-10-23 DIAGNOSIS — F419 Anxiety disorder, unspecified: Principal | ICD-10-CM

## 2017-10-24 NOTE — Telephone Encounter (Signed)
Ok to refill? Electronically refill request for DULoxetine (CYMBALTA) 60 MG capsule  Last prescribed on 03/26/2017. Last seen on 04/05/2017

## 2017-10-25 NOTE — Telephone Encounter (Signed)
Spoken and notified patient of Kate's comments. Patient stated that she will have to call back and schedule the appointment. Patient stated that she is not pregnant.

## 2017-10-25 NOTE — Telephone Encounter (Signed)
Noted  

## 2017-10-25 NOTE — Telephone Encounter (Signed)
Patient overdue for follow up, please schedule follow up visit. Will refill for 30 days. Also, is she pregnant? She will need to consult with her Gynecologist before continuing Cymbalta and Wellbutrin.

## 2017-11-10 ENCOUNTER — Other Ambulatory Visit: Payer: Self-pay | Admitting: Primary Care

## 2017-11-10 DIAGNOSIS — F32A Depression, unspecified: Secondary | ICD-10-CM

## 2017-11-10 DIAGNOSIS — F419 Anxiety disorder, unspecified: Principal | ICD-10-CM

## 2017-11-10 DIAGNOSIS — F329 Major depressive disorder, single episode, unspecified: Secondary | ICD-10-CM

## 2017-12-06 ENCOUNTER — Other Ambulatory Visit: Payer: Self-pay | Admitting: Primary Care

## 2017-12-06 DIAGNOSIS — F419 Anxiety disorder, unspecified: Principal | ICD-10-CM

## 2017-12-06 DIAGNOSIS — F329 Major depressive disorder, single episode, unspecified: Secondary | ICD-10-CM

## 2017-12-06 DIAGNOSIS — F32A Depression, unspecified: Secondary | ICD-10-CM

## 2017-12-06 NOTE — Telephone Encounter (Signed)
Based off of the chart it looks like she's pregnant, is this true? If so is she still taking the Cymbalta? How about Wellbutrin?

## 2017-12-06 NOTE — Telephone Encounter (Signed)
Ok to refill? Electronically refill request for Duloxetine (CYMBALTA) 60 MG capsule  Last prescribed on 10/23/2017. Last seen on 04/05/2017

## 2017-12-07 MED ORDER — DULOXETINE HCL 60 MG PO CPEP: 60.0000 mg | ORAL_CAPSULE | Freq: Every day | ORAL | 0 refills | Status: DC

## 2017-12-07 NOTE — Telephone Encounter (Signed)
Noted, will allow for 30 day supply but patient will need to be seen for follow up for further refills. Please change her pregnancy status.

## 2017-12-07 NOTE — Telephone Encounter (Signed)
I called the pt and she stated she is not pregnant and did not give any further details.  [facility].

## 2017-12-12 NOTE — Telephone Encounter (Signed)
Unable to remove pregnancy status.  Message left for patient to return my call.  Send message through [employer] and letter.

## 2018-01-04 ENCOUNTER — Other Ambulatory Visit: Payer: Self-pay | Admitting: Primary Care

## 2018-01-04 DIAGNOSIS — F419 Anxiety disorder, unspecified: Principal | ICD-10-CM

## 2018-01-04 DIAGNOSIS — F329 Major depressive disorder, single episode, unspecified: Secondary | ICD-10-CM

## 2018-01-24 ENCOUNTER — Encounter: Payer: Self-pay | Admitting: Primary Care

## 2018-01-24 ENCOUNTER — Ambulatory Visit (INDEPENDENT_AMBULATORY_CARE_PROVIDER_SITE_OTHER): Admitting: Primary Care

## 2018-01-24 VITALS — BP 122/76 | HR 90 | Temp 98.0°F | Ht 65.0 in | Wt 186.5 lb

## 2018-01-24 DIAGNOSIS — F329 Major depressive disorder, single episode, unspecified: Secondary | ICD-10-CM

## 2018-01-24 DIAGNOSIS — B001 Herpesviral vesicular dermatitis: Secondary | ICD-10-CM | POA: Diagnosis not present

## 2018-01-24 DIAGNOSIS — R5383 Other fatigue: Secondary | ICD-10-CM | POA: Diagnosis not present

## 2018-01-24 DIAGNOSIS — G43901 Migraine, unspecified, not intractable, with status migrainosus: Secondary | ICD-10-CM | POA: Diagnosis not present

## 2018-01-24 DIAGNOSIS — F419 Anxiety disorder, unspecified: Secondary | ICD-10-CM

## 2018-01-24 DIAGNOSIS — F32A Depression, unspecified: Secondary | ICD-10-CM

## 2018-01-24 DIAGNOSIS — Z0001 Encounter for general adult medical examination with abnormal findings: Secondary | ICD-10-CM

## 2018-01-24 DIAGNOSIS — M797 Fibromyalgia: Secondary | ICD-10-CM | POA: Diagnosis not present

## 2018-01-24 LAB — COMPREHENSIVE METABOLIC PANEL
ALT: 14 U/L (ref 0–35)
AST: 21 U/L (ref 0–37)
Albumin: 4.4 g/dL (ref 3.5–5.2)
Alkaline Phosphatase: 78 U/L (ref 39–117)
BUN: 7 mg/dL (ref 6–23)
CALCIUM: 10.2 mg/dL (ref 8.4–10.5)
CHLORIDE: 102 meq/L (ref 96–112)
CO2: 30 meq/L (ref 19–32)
Creatinine, Ser: 0.95 mg/dL (ref 0.40–1.20)
GFR: 73.37 mL/min (ref >60.00)
Glucose, Bld: 69 mg/dL — ABNORMAL LOW (ref 70–99)
Potassium: 4.2 mEq/L (ref 3.5–5.1)
Sodium: 139 mEq/L (ref 135–145)
Total Bilirubin: 0.4 mg/dL (ref 0.2–1.2)
Total Protein: 7.2 g/dL (ref 6.0–8.3)

## 2018-01-24 LAB — LIPID PANEL
CHOL/HDL RATIO: 3
Cholesterol: 219 mg/dL — ABNORMAL HIGH (ref 0–200)
HDL: 63.3 mg/dL (ref >39.00)
LDL Cholesterol: 129 mg/dL — ABNORMAL HIGH (ref 0–99)
NONHDL: 156.03
Triglycerides: 133 mg/dL (ref 0.0–149.0)
VLDL: 26.6 mg/dL (ref 0.0–40.0)

## 2018-01-24 LAB — VITAMIN B12: Vitamin B-12: 496 pg/mL (ref 211–911)

## 2018-01-24 LAB — CBC
HCT: 40.7 % (ref 36.0–46.0)
Hemoglobin: 13.7 g/dL (ref 12.0–15.0)
MCHC: 33.7 g/dL (ref 30.0–36.0)
MCV: 85.3 fl (ref 78.0–100.0)
Platelets: 321 10*3/uL (ref 150.0–400.0)
RBC: 4.78 Mil/uL (ref 3.87–5.11)
RDW: 15 % (ref 11.5–15.5)
WBC: 7 10*3/uL (ref 4.0–10.5)

## 2018-01-24 LAB — VITAMIN D 25 HYDROXY (VIT D DEFICIENCY, FRACTURES): VITD: 25.33 ng/mL — ABNORMAL LOW (ref 30.00–100.00)

## 2018-01-24 LAB — TSH: TSH: 1.53 u[IU]/mL (ref 0.35–4.50)

## 2018-01-24 LAB — HEMOGLOBIN A1C: Hgb A1c MFr Bld: 5.8 % (ref 4.6–6.5)

## 2018-01-24 MED ORDER — VALACYCLOVIR HCL 500 MG PO TABS: ORAL_TABLET | ORAL | 1 refills | Status: DC

## 2018-01-24 MED ORDER — DULOXETINE HCL 60 MG PO CPEP: 60.0000 mg | ORAL_CAPSULE | Freq: Every day | ORAL | 0 refills | Status: DC

## 2018-01-24 NOTE — Patient Instructions (Addendum)
Stop by the lab prior to leaving today. I will notify you of your results once received.   Continue exercising. You should be getting 150 minutes of moderate intensity exercise weekly.  Increase consumption of vegetables, fruit, whole grains, lean protein.  Ensure you are consuming 64 ounces of water daily.  Please find the date of your last tetanus vaccination so I can update your records.  You will be contacted regarding your referral to psychiatry.  Please let us know if you have not been contacted within one week.   Follow up in 1 year for your annual exam or sooner if needed.  It was a pleasure to see you today!

## 2018-01-24 NOTE — Assessment & Plan Note (Signed)
Overall stable, improved with increased dose of Cymbalta. Continue same.

## 2018-01-24 NOTE — Assessment & Plan Note (Signed)
Managed on Wellbutrin XL and Cymbalta 60 mg. Overall doing well in regards to anxiety, does still have feelings of feeling "down, fatigued" and is sleeping a lot. No longer seeing therapist as she didn't see benefit.   PHQ 9 score 16. Will send to psychiatry for further treatment. Denies SI/HI.

## 2018-01-24 NOTE — Assessment & Plan Note (Signed)
No recent migraine, overall decreased. Currently taking Advil for headaches that occur once to twice weekly. Mostly located to the bilateral occipital lobes with radiation of pain behind right eye.

## 2018-01-24 NOTE — Progress Notes (Signed)
Subjective:    Patient ID: Nichole Mcbride, female    DOB: 04-14-1986, 32 y.o.   MRN: [id number]  HPI  Nichole Mcbride is a 33 year old female who presents today for complete physical.  She also reports fatigue, depression. She'll feel symptoms of feeling down, sleeping a lot. She's not seeing therapy any longer as she didn't find it helpful. She's never seeing psychiatry.   Immunizations: -Tetanus: Believes it's UTD. -Influenza: Did not complete last season   Diet: She endorses a fair diet. Breakfast: Eggs with cheese, muffins Lunch: Chicken, rice, sandwich Dinner: Chicken, rice, potatoes, vegetables Snacks: None Desserts: Chocolate, 2-3 times weekly Beverages: Water, green tea, occasional Coke  Exercise: She is walking 5 days weekly Eye exam: Completed in 2018 Dental exam: Completed one year ago Pap Smear: Completed in 2018   Review of Systems  Constitutional: Negative for unexpected weight change.  HENT: Negative for rhinorrhea.   Respiratory: Negative for cough and shortness of breath.   Cardiovascular: Negative for chest pain.  Gastrointestinal: Negative for constipation and diarrhea.  Genitourinary: Negative for difficulty urinating and menstrual problem.  Musculoskeletal: Negative for arthralgias and myalgias.  Skin: Negative for rash.  Allergic/Immunologic: Negative for environmental allergies.  Neurological: Negative for dizziness, numbness and headaches.  Psychiatric/Behavioral:       See HPI          Past Medical History:  Diagnosis Date  . Carpal tunnel syndrome    bilaterally  . DEPRESSION 06/10/2008  . INSOMNIA 06/10/2008  . Migraine headache   . Raynaud phenomenon   . SCOLIOSIS 06/10/2008  . Sjogren's disease (HCC)      Social History   Socioeconomic History  . Marital status: Married    Spouse name: Not on file  . Number of children: Not on file  . Years of education: Not on file  . Highest education level: Not on file  Occupational  History  . Not on file  Social Needs  . Financial resource strain: Not on file  . Food insecurity:    Worry: Not on file    Inability: Not on file  . Transportation needs:    Medical: Not on file    Non-medical: Not on file  Tobacco Use  . Smoking status: Current Some Day Smoker  . Smokeless tobacco: Never Used  Substance and Sexual Activity  . Alcohol use: Yes  . Drug use: No  . Sexual activity: Not Currently    Birth control/protection: None  Lifestyle  . Physical activity:    Days per week: Not on file    Minutes per session: Not on file  . Stress: Not on file  Relationships  . Social connections:    Talks on phone: Not on file    Gets together: Not on file    Attends religious service: Not on file    Active member of club or organization: Not on file    Attends meetings of clubs or organizations: Not on file    Relationship status: Not on file  . Intimate partner violence:    Fear of current or ex partner: Not on file    Emotionally abused: Not on file    Physically abused: Not on file    Forced sexual activity: Not on file  Other Topics Concern  . Not on file  Social History Narrative   Married.   2 children.   Teaches English.   Enjoys spending time with family.    Past Surgical History:  Procedure Laterality Date  . INTRAUTERINE DEVICE INSERTION  11/05/2009    Family History  Problem Relation Age of Onset  . Diabetes Mother   . Hypertension Mother   . Hypothyroidism Mother   . Hypertension Father   . Hypothyroidism Father     Allergies  Allergen Reactions  . Ciprofloxacin Hcl     "bad reaction"  . Imitrex [Sumatriptan Succinate]     Current Outpatient Medications on File Prior to Visit  Medication Sig Dispense Refill  . buPROPion (WELLBUTRIN XL) 150 MG 24 hr tablet TAKE ONE TABLET BY MOUTH DAILY 90 tablet 1   No current facility-administered medications on file prior to visit.     BP 122/76   Pulse 90   Temp 98 F (36.7 C) (Oral)    Ht  (1.651 m)   Wt 186 lb 8 oz (84.6 kg)   LMP 01/13/2018   SpO2 99%   Breastfeeding? Unknown   BMI 31.04 kg/m    Objective:   Physical Exam  Constitutional: She is oriented to person, place, and time. She appears well-nourished.  HENT:  Right Ear: Tympanic membrane and ear canal normal.  Left Ear: Tympanic membrane and ear canal normal.  Nose: Nose normal.  Mouth/Throat: Oropharynx is clear and moist.  Eyes: Pupils are equal, round, and reactive to light. Conjunctivae and EOM are normal.  Neck: Neck supple. No thyromegaly present.  Cardiovascular: Normal rate and regular rhythm.  No murmur heard. Pulmonary/Chest: Effort normal and breath sounds normal. She has no rales.  Abdominal: Soft. Bowel sounds are normal. There is no tenderness.  Musculoskeletal: Normal range of motion.  Lymphadenopathy:    She has no cervical adenopathy.  Neurological: She is alert and oriented to person, place, and time. She has normal reflexes. No cranial nerve deficit.  Skin: Skin is warm and dry. No rash noted.  Psychiatric: She has a normal mood and affect.          Assessment & Plan:

## 2018-01-24 NOTE — Assessment & Plan Note (Signed)
Last outbreak was 2 weeks ago. Last dose of daily Valtrex was early/mid March 2019. Will refill for PRN dosing.

## 2018-01-25 ENCOUNTER — Other Ambulatory Visit: Payer: Self-pay | Admitting: Primary Care

## 2018-01-25 DIAGNOSIS — R7303 Prediabetes: Secondary | ICD-10-CM | POA: Insufficient documentation

## 2018-01-25 NOTE — Assessment & Plan Note (Signed)
Recent A1C of 5.8 which has improved from 6.0 one year ago. Will repeat in 6 months. Discussed need to exercise and eat a healthy diet during her CPE.

## 2018-05-08 ENCOUNTER — Encounter (HOSPITAL_COMMUNITY): Payer: Self-pay | Admitting: Emergency Medicine

## 2018-05-08 ENCOUNTER — Emergency Department (HOSPITAL_COMMUNITY)
Admission: EM | Admit: 2018-05-08 | Discharge: 2018-05-09 | Disposition: A | Discharge status: Home / Self Care | Attending: Emergency Medicine | Admitting: Emergency Medicine

## 2018-05-08 DIAGNOSIS — Z79899 Other long term (current) drug therapy: Secondary | ICD-10-CM | POA: Insufficient documentation

## 2018-05-08 DIAGNOSIS — G43909 Migraine, unspecified, not intractable, without status migrainosus: Secondary | ICD-10-CM | POA: Diagnosis not present

## 2018-05-08 DIAGNOSIS — Z87891 Personal history of nicotine dependence: Secondary | ICD-10-CM | POA: Insufficient documentation

## 2018-05-08 DIAGNOSIS — R51 Headache: Secondary | ICD-10-CM | POA: Diagnosis present

## 2018-05-08 LAB — CBG MONITORING, ED: GLUCOSE-CAPILLARY: 103 mg/dL — AB (ref 70–99)

## 2018-05-08 MED ORDER — METOCLOPRAMIDE HCL 5 MG/ML IJ SOLN
10.0000 mg | INTRAMUSCULAR | Status: AC
  Administered 2018-05-08: 10 mg via INTRAVENOUS
  Filled 2018-05-08: qty 2

## 2018-05-08 MED ORDER — SODIUM CHLORIDE 0.9 % IV BOLUS
1000.0000 mL | Freq: Once | INTRAVENOUS | Status: AC
  Administered 2018-05-08: 1000 mL via INTRAVENOUS

## 2018-05-08 MED ORDER — DEXAMETHASONE SODIUM PHOSPHATE 10 MG/ML IJ SOLN
10.0000 mg | Freq: Once | INTRAMUSCULAR | Status: AC
  Administered 2018-05-08: 10 mg via INTRAVENOUS
  Filled 2018-05-08: qty 1

## 2018-05-08 NOTE — ED Triage Notes (Signed)
Pt reports migraine X4 days. N/V/D, photosensitivity. Pt states hx of migraines, follows up with neurology but they are unable to see her for another 2 weeks.

## 2018-05-08 NOTE — ED Provider Notes (Signed)
[facility] Provider Note   CSN: [id number] Arrival date & time: 05/08/18  2149    History   Chief Complaint Chief Complaint  Patient presents with  . Migraine    HPI Nichole Mcbride is a 32 y.o. female.  32 year old female with a history of migraine headaches presents to the emergency department for a headache which began 4 days ago.  It began in her occiput and has migrated to be a more global headache with some localized pain behind her right eye.  She describes her headache discomfort as sharp.  She has noted some intermittent blurry vision in her right eye as well as bilateral tinnitus.  Symptoms further associated with photophobia as well as nausea with one episode of vomiting last evening.  She has only taken Tylenol with codeine for symptoms yesterday.  She did not think this helped her pain very much.  She has [facility]; previously unsuccessful migraine management with propranolol and Imitrex.  Denies any recent head injury or trauma.  No associated fevers, extremity numbness or paresthesias, extremity weakness.  She called her primary care doctor to be seen, but is unable to follow-up with them for another 2 weeks.     Past Medical History:  Diagnosis Date  . Carpal tunnel syndrome    bilaterally  . DEPRESSION 06/10/2008  . INSOMNIA 06/10/2008  . Migraine headache   . Raynaud phenomenon   . SCOLIOSIS 06/10/2008  . Sjogren's disease [facility](HCC)     Patient Active Problem List   Diagnosis Date Noted  . Prediabetes 01/25/2018  . Supervision of other normal pregnancy, antepartum 07/09/2017  . BMI 31.0-31.9,adult 07/09/2017  . Rash and nonspecific skin eruption 06/15/2016  . 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 Encounter for routine adult medical exam with abnormal findings 05/04/2016  . Fibromyalgia 03/23/2016  . Anxiety and depression 03/23/2016  . Sjogren's disease (HCC) 03/23/2016  . Migraine headache  10/14/2011  . Raynaud's disease 09/20/2011  . SCOLIOSIS 06/10/2008    Past Surgical History:  Procedure Laterality Date  . INTRAUTERINE DEVICE INSERTION  11/05/2009     OB History    Gravida  3   Para  2   Term  2   Preterm  0   AB  0   Living  2     SAB      TAB      Ectopic      Multiple      Live Births  1            Home Medications    Prior to Admission medications   Medication Sig Start Date End Date Taking? Authorizing Provider  buPROPion (WELLBUTRIN XL) 150 MG 24 hr tablet TAKE ONE TABLET BY MOUTH DAILY 11/12/17  Yes Doreene Nestlark, Katherine K, NP  DULoxetine (CYMBALTA) 60 MG capsule Take 1 capsule (60 mg total) by mouth daily. 01/24/18  Yes Doreene Nestlark, Katherine K, NP  valACYclovir (VALTREX) 500 MG tablet Take 1 tablet by mouth twice daily for three days as needed for outbreaks. 01/24/18  Yes Doreene Nestlark, Katherine K, NP  butalbital-acetaminophen-caffeine (FIORICET, ESGIC) [phone] MG tablet Take 1-2 tablets by mouth every 8 (eight) hours as needed for headache or migraine. 05/09/18 05/09/19  Antony MaduraHumes, Quanell Loughney, PA-C    Family History Family History  Problem Relation Age of Onset  . Diabetes Mother   . Hypertension Mother   . Hypothyroidism Mother   . Hypertension Father   . Hypothyroidism Father  Social History Social History   Tobacco Use  . Smoking status: Former Smoker    Types: Cigarettes  . Smokeless tobacco: Never Used  Substance Use Topics  . Alcohol use: Yes  . Drug use: No     Allergies   Ciprofloxacin hcl and Imitrex [sumatriptan succinate]   Review of Systems Review of Systems Ten systems reviewed and are negative for acute change, except as noted in the HPI.    Physical Exam Updated Vital Signs BP 114/69   Pulse 77   Temp 98.7 F (37.1 C) (Oral)   Resp 18   Ht 5\' 5"  (1.651 m)   Wt 83.9 kg   LMP 06/06/2017 (Exact Date)   SpO2 97%   BMI 30.79 kg/m   Physical Exam  Constitutional: She is oriented to person, place, and time. She  appears well-developed and well-nourished. No distress.  Nontoxic appearing and in NAD  HENT:  Head: Normocephalic and atraumatic.  Eyes: Conjunctivae and EOM are normal. No scleral icterus.  Neck: Normal range of motion.  No nuchal rigidity or meningismus   Pulmonary/Chest: Effort normal. No stridor. No respiratory distress.  Respirations even and unlabored  Musculoskeletal: Normal range of motion.  Neurological: She is alert and oriented to person, place, and time.  GCS 15. Speech is goal oriented. No cranial nerve deficits appreciated; symmetric eyebrow raise, no facial drooping, tongue midline. Patient has equal grip strength bilaterally with 5/5 strength against resistance in all major muscle groups bilaterally. Sensation to light touch intact. Patient moves extremities without ataxia. Patient ambulatory with steady gait.  Skin: Skin is warm and dry. No rash noted. She is not diaphoretic. No erythema. No pallor.  Psychiatric: She has a normal mood and affect. Her behavior is normal.  Nursing note and vitals reviewed.    ED Treatments / Results  Labs (all labs ordered are listed, but only abnormal results are displayed) Labs Reviewed  CBG MONITORING, ED - Abnormal; Notable for the following components:      Result Value   Glucose-Capillary 103 (*)    All other components within normal limits    EKG None  Radiology No results found.  Procedures Procedures (including critical care time)  Medications Ordered in ED Medications  sodium chloride 0.9 % bolus 1,000 mL (0 mLs Intravenous Stopped 05/09/18 0043)  metoCLOPramide (REGLAN) injection 10 mg (10 mg Intravenous Given 05/08/18 2340)  dexamethasone (DECADRON) injection 10 mg (10 mg Intravenous Given 05/08/18 2340)    12:30 AM Patient reassessed.  States headache has improved from 9/10 down to 4/10.  Declines additional medication at this time.  Expresses comfort with discharge.  Would appreciate referral to neurologist in  the area.   Initial Impression / Assessment and Plan / ED Course  I have reviewed the triage vital signs and the nursing notes.  Pertinent labs & imaging results that were available during my care of the patient were reviewed by me and considered in my medical decision making (see chart for details).     Patient presents to the emergency department for evaluation of headache which began 4 days ago.  Reports prior migraines which have been similar in onset and location.  [facility].  Patient with no history of recent head injury or trauma.  No fever, nuchal rigidity, meningismus to suggest meningitis.  Neurologic exam today is nonfocal.    On reassessment, the patient has had significant improvement in headache symptoms following a migraine cocktail.  I do not believe  further emergent workup is indicated at this time.  Return precautions discussed and provided.  Patient discharged in stable condition with no unaddressed concerns.   Final Clinical Impressions(s) / ED Diagnoses   Final diagnoses:  Migraine without status migrainosus, not intractable, unspecified migraine type    ED Discharge Orders         Ordered    butalbital-acetaminophen-caffeine (FIORICET, ESGIC) 50-325-40 MG tablet  Every 8 hours PRN     05/09/18 0037           Antony Madura, PA-C 05/09/18 1610    Mancel Bale, MD 05/09/18 0151

## 2018-05-09 MED ORDER — BUTALBITAL-APAP-CAFFEINE 50-325-40 MG PO TABS: 1.0000 | ORAL_TABLET | Freq: Three times a day (TID) | ORAL | 0 refills | Status: AC | PRN

## 2018-05-13 ENCOUNTER — Ambulatory Visit (INDEPENDENT_AMBULATORY_CARE_PROVIDER_SITE_OTHER): Admitting: Family Medicine

## 2018-05-13 ENCOUNTER — Encounter: Payer: Self-pay | Admitting: Family Medicine

## 2018-05-13 VITALS — BP 130/90 | HR 86 | Temp 98.6°F | Ht 65.0 in | Wt 198.5 lb

## 2018-05-13 DIAGNOSIS — G43901 Migraine, unspecified, not intractable, with status migrainosus: Secondary | ICD-10-CM | POA: Diagnosis not present

## 2018-05-13 DIAGNOSIS — M542 Cervicalgia: Secondary | ICD-10-CM | POA: Diagnosis not present

## 2018-05-13 MED ORDER — PREDNISONE 20 MG PO TABS: ORAL_TABLET | ORAL | 0 refills | Status: DC

## 2018-05-13 MED ORDER — TIZANIDINE HCL 4 MG PO TABS: 4.0000 mg | ORAL_TABLET | Freq: Every evening | ORAL | 2 refills | Status: DC

## 2018-05-13 NOTE — Progress Notes (Signed)
Dr. Karleen HampshireSpencer T. Tyshun Tuckerman, MD, CAQ Sports Medicine Primary Care and Sports Medicine [address] [place], [id number] Phone: [phone] Fax: [phone]  05/13/2018  Patient: Nichole Mcbride, MRN: [id number], DOB: 01/14/1986, 32 y.o.  Primary Physician:  Doreene Nestlark, Katherine K, NP   Chief Complaint  Patient presents with  . Neck Pain    seen in ER last Wednesday night  . Facial Pain  . Headache   Subjective:   Nichole Mcbride is a 32 y.o. very pleasant female patient who presents with the following:  She is here with major c/o acute neck pain and ongoing migraine for multiple days.   05/08/2018 ER visit for migraine Decadron 10 mg Reglan 10 mg  In the past she has used Inderal and Imitrex (intolerant) Did see [facility] in the past  Neck pain at the base of her skull and had a headache last Monday and it never went away. Had a Migraine through Wed night and the next morning and ongoing dull neck pain continued.   Neck has been really bad an swollen to the touch and trouble to pain and trouble. Eyes hurt and some light sensitivity. Pressure all over.  Sound is bigger issue now.   Tried some ibuprofen and Tylenol. Heat and cold.   Never had prior neck surgeries or fractures.   No radicular pain. No shoulder blade pain.   Past Medical History, Surgical History, Social History, Family History, Problem List, Medications, and Allergies have been reviewed and updated if relevant.  Patient Active Problem List   Diagnosis Date Noted  . Prediabetes 01/25/2018  . Supervision of other normal pregnancy, antepartum 07/09/2017  . BMI 31.0-31.9,adult 07/09/2017  . Rash and nonspecific skin eruption 06/15/2016  . 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 Encounter for routine adult medical exam with abnormal findings 05/04/2016  . Fibromyalgia 03/23/2016  . Anxiety and depression 03/23/2016  . Sjogren's disease (HCC) 03/23/2016  . Migraine headache 10/14/2011  . Raynaud's  disease 09/20/2011  . SCOLIOSIS 06/10/2008    Past Medical History:  Diagnosis Date  . Carpal tunnel syndrome    bilaterally  . DEPRESSION 06/10/2008  . INSOMNIA 06/10/2008  . Migraine headache   . Raynaud phenomenon   . SCOLIOSIS 06/10/2008  . Sjogren's disease [facility](HCC)     Past Surgical History:  Procedure Laterality Date  . INTRAUTERINE DEVICE INSERTION  11/05/2009    Social History   Socioeconomic History  . Marital status: Married    Spouse name: Not on file  . Number of children: Not on file  . Years of education: Not on file  . Highest education level: Not on file  Occupational History  . Not on file  Social Needs  . Financial resource strain: Not on file  . Food insecurity:    Worry: Not on file    Inability: Not on file  . Transportation needs:    Medical: Not on file    Non-medical: Not on file  Tobacco Use  . Smoking status: Former Smoker    Types: Cigarettes  . Smokeless tobacco: Never Used  Substance and Sexual Activity  . Alcohol use: Yes  . Drug use: No  . Sexual activity: Not Currently    Birth control/protection: None  Lifestyle  . Physical activity:    Days per week: Not on file    Minutes per session: Not on file  . Stress: Not on file  Relationships  . Social connections:    Talks on phone:  Not on file    Gets together: Not on file    Attends religious service: Not on file    Active member of club or organization: Not on file    Attends meetings of clubs or organizations: Not on file    Relationship status: Not on file  . Intimate partner violence:    Fear of current or ex partner: Not on file    Emotionally abused: Not on file    Physically abused: Not on file    Forced sexual activity: Not on file  Other Topics Concern  . Not on file  Social History Narrative   Married.   2 children.   Teaches English.   Enjoys spending time with family.    Family History  Problem Relation Age of Onset  . Diabetes Mother   . Hypertension  Mother   . Hypothyroidism Mother   . Hypertension Father   . Hypothyroidism Father     Allergies  Allergen Reactions  . Ciprofloxacin Hcl Other (See Comments)    unknown  . Imitrex [Sumatriptan Succinate] Diarrhea and Nausea And Vomiting    severe    Medication list reviewed and updated in full in  Link.   GEN: No acute illnesses, no fevers, chills. GI: No n/v/d, eating normally Pulm: No SOB Interactive and getting along well at home.  Otherwise, ROS is as per the HPI.  Objective:   BP 130/90   Pulse 86   Temp 98.6 F (37 C) (Oral)   Ht 5\' 5"  (1.651 m)   Wt 198 lb 8 oz (90 kg)   LMP 05/01/2018   BMI 33.03 kg/m    GEN: WDWN, NAD, Non-toxic, A & O x 3 HEENT: Atraumatic, Normocephalic. Neck supple. No masses, No LAD. Ears and Nose: No external deformity. CV: RRR, No M/G/R. No JVD. No thrill. No extra heart sounds. PULM: CTA B, no wheezes, crackles, rhonchi. No retractions. No resp. distress. No accessory muscle use. ABD: S, NT, ND, +BS. No rebound tenderness. No HSM.  EXTR: No c/c/e  Neuro: CN 2-12 grossly intact. PERRLA. EOMI. Sensation intact throughout. Str 5/5 all extremities. DTR 2+. No clonus. A and o x 4. Romberg neg. Finger nose neg. Heel -shin neg.   PSYCH: Normally interactive. Conversant. Not depressed or anxious appearing.  Calm demeanor.    CERVICAL SPINE EXAM Range of motion: Flexion, extension, lateral bending, and rotation: 40% loss flexion and ext, 50-60% loss of lateral movements Pain with terminal motion: y Spinous Processes: NT SCM: NT Upper paracervical muscles: markedly ttp Upper traps: NT C5-T1 intact, sensation and motor   Laboratory and Imaging Data:  Assessment and Plan:   Cervicalgia  Status migrainosus  ROM, heat, steroids, zanaflex for neck.  I suspect this will help migraine as well.  She is calling [facility] for long term management. Failure b-blockers, on to anti-depressants currently (I will not use TCA in this  case. For epilepsy meds, I usually involve neurology.  Follow-up: No follow-ups on file.  Meds ordered this encounter  Medications  . predniSONE (DELTASONE) 20 MG tablet    Sig: 2 tabs po for 5 days, then 1 tab po for 5 days    Dispense:  15 tablet    Refill:  0  . tiZANidine (ZANAFLEX) 4 MG tablet    Sig: Take 1 tablet (4 mg total) by mouth Nightly.    Dispense:  30 tablet    Refill:  2   Signed,  Hikari Tripp T. Williams Dietrick, MD  Allergies as of 05/13/2018      Reactions   Ciprofloxacin Hcl Other (See Comments)   unknown   Imitrex [sumatriptan Succinate] Diarrhea, Nausea And Vomiting   severe      Medication List        Accurate as of 05/13/18  1:46 PM. Always use your most recent med list.          buPROPion 150 MG 24 hr tablet Commonly known as:  WELLBUTRIN XL TAKE ONE TABLET BY MOUTH DAILY   butalbital-acetaminophen-caffeine 50-325-40 MG tablet Commonly known as:  FIORICET, ESGIC Take 1-2 tablets by mouth every 8 (eight) hours as needed for headache or migraine.   DULoxetine 60 MG capsule Commonly known as:  CYMBALTA Take 1 capsule (60 mg total) by mouth daily.   predniSONE 20 MG tablet Commonly known as:  DELTASONE 2 tabs po for 5 days, then 1 tab po for 5 days   tiZANidine 4 MG tablet Commonly known as:  ZANAFLEX Take 1 tablet (4 mg total) by mouth Nightly.   valACYclovir 500 MG tablet Commonly known as:  VALTREX Take 1 tablet by mouth twice daily for three days as needed for outbreaks.

## 2018-05-15 ENCOUNTER — Encounter (INDEPENDENT_AMBULATORY_CARE_PROVIDER_SITE_OTHER): Payer: Self-pay

## 2018-05-16 ENCOUNTER — Ambulatory Visit: Admitting: Family Medicine

## 2018-05-21 ENCOUNTER — Encounter: Payer: Self-pay | Admitting: Primary Care

## 2018-05-21 ENCOUNTER — Ambulatory Visit (INDEPENDENT_AMBULATORY_CARE_PROVIDER_SITE_OTHER): Admitting: Primary Care

## 2018-05-21 DIAGNOSIS — G43901 Migraine, unspecified, not intractable, with status migrainosus: Secondary | ICD-10-CM

## 2018-05-21 MED ORDER — KETOROLAC TROMETHAMINE 60 MG/2ML IM SOLN
60.0000 mg | Freq: Once | INTRAMUSCULAR | Status: AC
  Administered 2018-05-21: 60 mg via INTRAMUSCULAR

## 2018-05-21 NOTE — Progress Notes (Signed)
Subjective:    Patient ID: Nichole Mcbride, female    DOB: 02/17/1986, 32 y.o.   MRN: [id number]  HPI  Nichole Mcbride is a 32 year old female with a history of migraines who presents today with a chief complaint of migraines. She has failed treatment on propranolol and cannot tolerate sumatriptan, previously followed with [facility].    Two weeks ago she noticed a headache to the right occipital lobe with radiation through her right eye. She felt nauseated, vomited, felt photophobia. She took ibuprofen, applied ice packs, and attempted to sleep without improvement. She then proceeded to the emergency department, treated with Decardron 10 mg and Reglan 10 mg with some improvement. She was provided with a prescription for Fioricet and discharged home. She woke up the next day and symptoms returned in full force.   Since then her headache has moved to the left occipital lobe with decrease in ROM to her neck. She saw Dr. Patsy Lageropland on 05/13/18 and was treated with prednisone and tizanidine. She was also referred to neurology for further management. She does not yet have an appointment.  She then took OTC sinus symptom medication four days ago after her headache moved to the bilateral frontal lobes. She felt the most relieve after taking the sinus medication than with any prior treatment. She has taken a few doses of the Fioricet with some improvement.  Today her headache is dull and overall improved. It is located to the bilateral frontal region. She finished her last prednisone dose this morning. She denies nausea, photophobia, nasal congestion, cough, fevers, rhinorrhea. Her neck pain has improved, she is no longer taking Tizanidine.   Review of Systems  Constitutional: Negative for fever.  HENT: Negative for congestion, rhinorrhea and sinus pressure.   Eyes: Negative for photophobia and visual disturbance.  Respiratory: Negative for cough.   Neurological: Positive for headaches.  Negative for dizziness.       Past Medical History:  Diagnosis Date  . Carpal tunnel syndrome    bilaterally  . DEPRESSION 06/10/2008  . INSOMNIA 06/10/2008  . Migraine headache   . Raynaud phenomenon   . SCOLIOSIS 06/10/2008  . Sjogren's disease (HCC)      Social History   Socioeconomic History  . Marital status: Married    Spouse name: Not on file  . Number of children: Not on file  . Years of education: Not on file  . Highest education level: Not on file  Occupational History  . Not on file  Social Needs  . Financial resource strain: Not on file  . Food insecurity:    Worry: Not on file    Inability: Not on file  . Transportation needs:    Medical: Not on file    Non-medical: Not on file  Tobacco Use  . Smoking status: Former Smoker    Types: Cigarettes  . Smokeless tobacco: Never Used  Substance and Sexual Activity  . Alcohol use: Yes  . Drug use: No  . Sexual activity: Not Currently    Birth control/protection: None  Lifestyle  . Physical activity:    Days per week: Not on file    Minutes per session: Not on file  . Stress: Not on file  Relationships  . Social connections:    Talks on phone: Not on file    Gets together: Not on file    Attends religious service: Not on file    Active member of club or organization: Not on file  Attends meetings of clubs or organizations: Not on file    Relationship status: Not on file  . Intimate partner violence:    Fear of current or ex partner: Not on file    Emotionally abused: Not on file    Physically abused: Not on file    Forced sexual activity: Not on file  Other Topics Concern  . Not on file  Social History Narrative   Married.   2 children.   Teaches English.   Enjoys spending time with family.    Past Surgical History:  Procedure Laterality Date  . INTRAUTERINE DEVICE INSERTION  11/05/2009    Family History  Problem Relation Age of Onset  . Diabetes Mother   . Hypertension Mother   .  Hypothyroidism Mother   . Hypertension Father   . Hypothyroidism Father     Allergies  Allergen Reactions  . Ciprofloxacin Hcl Other (See Comments)    unknown  . Imitrex [Sumatriptan Succinate] Diarrhea and Nausea And Vomiting    severe    Current Outpatient Medications on File Prior to Visit  Medication Sig Dispense Refill  . buPROPion (WELLBUTRIN XL) 150 MG 24 hr tablet TAKE ONE TABLET BY MOUTH DAILY 90 tablet 1  . butalbital-acetaminophen-caffeine (FIORICET, ESGIC) 50-325-40 MG tablet Take 1-2 tablets by mouth every 8 (eight) hours as needed for headache or migraine. 20 tablet 0  . DULoxetine (CYMBALTA) 60 MG capsule Take 1 capsule (60 mg total) by mouth daily. 90 capsule 0  . predniSONE (DELTASONE) 20 MG tablet 2 tabs po for 5 days, then 1 tab po for 5 days 15 tablet 0  . tiZANidine (ZANAFLEX) 4 MG tablet Take 1 tablet (4 mg total) by mouth Nightly. 30 tablet 2  . valACYclovir (VALTREX) 500 MG tablet Take 1 tablet by mouth twice daily for three days as needed for outbreaks. 6 tablet 1   No current facility-administered medications on file prior to visit.     BP 122/78   Pulse 90   Temp 98.3 F (36.8 C) (Oral)   Ht 5\' 5"  (1.651 m)   Wt 200 lb (90.7 kg)   LMP 05/01/2018   SpO2 97%   BMI 33.28 kg/m    Objective:   Physical Exam  Constitutional: She is oriented to person, place, and time. She appears well-nourished.  HENT:  Right Ear: Tympanic membrane and ear canal normal.  Left Ear: Tympanic membrane and ear canal normal.  Nose: Nose normal. Right sinus exhibits no maxillary sinus tenderness and no frontal sinus tenderness. Left sinus exhibits no maxillary sinus tenderness and no frontal sinus tenderness.  Mouth/Throat: Oropharynx is clear and moist.  Eyes: EOM are normal.  Neck: Neck supple.  Cardiovascular: Normal rate.  Neurological: She is alert and oriented to person, place, and time. No cranial nerve deficit.  Skin: Skin is warm and dry.  Psychiatric: She has  a normal mood and affect.           Assessment & Plan:

## 2018-05-21 NOTE — Patient Instructions (Signed)
Call [facility] for an appointment, notify me if you need a referral.  You may take the Fioricet as needed for headaches. You were provided with an injection of Toradol today so avoid Advil/ibuprofen/Motrin today.  Please call me if you do not feel better within a few days or if your symptoms become worse.  It was a pleasure to see you today!

## 2018-05-21 NOTE — Assessment & Plan Note (Addendum)
Two week migraine, treated in the emergency department and also in our clinic. Emergency department visit reviewed. Overall improved but without resolve. Neuro exam today unremarkable. Still believe symptoms are secondary to migraine, no signs of sinusitis or other cause.   IM Toradol 60 mg provided today. She will call [facility] for an appointment, she will contact us if she needs a referral.  She will update if symptoms persist and/or worsen. Consider CT head (imaging) at that point.

## 2018-05-21 NOTE — Addendum Note (Signed)
Addended by: Tawnya CrookSAMBATH, Kateleen Encarnacion on: 05/21/2018 11:20 AM   Modules accepted: Orders

## 2018-06-04 ENCOUNTER — Other Ambulatory Visit: Payer: Self-pay | Admitting: Primary Care

## 2018-06-04 DIAGNOSIS — F329 Major depressive disorder, single episode, unspecified: Secondary | ICD-10-CM

## 2018-06-04 DIAGNOSIS — F419 Anxiety disorder, unspecified: Principal | ICD-10-CM

## 2018-06-09 ENCOUNTER — Other Ambulatory Visit: Payer: Self-pay | Admitting: Primary Care

## 2018-06-09 DIAGNOSIS — F329 Major depressive disorder, single episode, unspecified: Secondary | ICD-10-CM

## 2018-06-09 DIAGNOSIS — F419 Anxiety disorder, unspecified: Principal | ICD-10-CM

## 2018-07-02 DIAGNOSIS — F419 Anxiety disorder, unspecified: Principal | ICD-10-CM

## 2018-07-02 DIAGNOSIS — F329 Major depressive disorder, single episode, unspecified: Secondary | ICD-10-CM

## 2018-07-17 ENCOUNTER — Ambulatory Visit: Payer: Self-pay

## 2018-07-17 NOTE — Telephone Encounter (Signed)
Spoke with patient's mom at length.  Given severity of symptoms, level of concern and lack of MD availability this pm, I reiterated need for ED eval this pm.  Patient and daughter both refuse stating they feel unsupported by our office for not working her in this pm.  I apologized that she feels this way as it is our desire that the patient get the best, most appropriate care and if symptoms severe that she does not feel safe to wait for eval tomorrow with PCP then ER is best option at this time.  I did discuss this with Mayra Reel, NP and she agrees but is happy to call the patient/mom and discuss this further if needed.  Mayra Reel, NP has opened up her 12pm slot tomorrow to work patient in to be seen.  I called mom back and made her aware and she says she will stay with her daughter tonight and bring her to appointment tomorrow.  She does not need for Jae Dire to call her back this pm.  They know if symptoms/behavior becomes more concerning through the night to go to ER.  Appointment has been made for daughter for tomorrow at 12pm.

## 2018-07-17 NOTE — Telephone Encounter (Signed)
Patient's mother called in crying and says "my daughter is severely depressed, she's laying in the bed and has been there all day. She did not get up to fix her children breakfast or take them to school. I need to know what I need to do, bring her to a hospital?" I asked if she's there with her daughter, she says "yes, I'm outside on the porch." I asked to speak to the patient, she says "she will not talk to you." I advised I will need to speak to her to ask her some questions about her state of mind, to just put the phone on speaker and let me talk to her that way. She put the phone on speaker and the patient got on the phone. I asked the patient if it is ok to ask some questions, she says "yes." I asked what is going on today, she says "I'm just tired." I asked her is this something new today or has it been building up and today is the worse feeling, she says "it's just today." I asked is she having thoughts of suicide, feeling hopeless, not wanting to live, she denies all suicidal thoughts or homicidal thoughts toward others. I asked about her depression medication, she says "I take them every night before bed and I haven't missed any doses." I asked about a therapist, she says "I have an appointment in November to see a psychiatrist for the first time." I asked about other symptoms, she denies except for joint pain. I asked her to elaborate on new stress or recent changes in her life, she says "I don't want to discuss anything right now." According to protocol, see PCP within 24 hours, I advised no openings with PCP. Her mother asks if she can be worked into the schedule today. I called the office and spoke to [place], [facility] who verified with Jae Dire that her schedule is full today and tomorrow and I could schedule her with another provider today or tomorrow. I advised the mother of the recommendation from Jae Dire, no availability today with another provider, but availability tomorrow. The mother says "tomorrow is not an  option. You mean to tell me there is no way any provider in the office can see her. This is an emergency and she needs to be seen today." I advised to go to the ED, she says "she will not let me take her to the ED." I asked her to hold the line while I call the office to transfer her call. I called and spoke to [place], [facility] about the above, she placed me on hold. I advised the mom to continue to hold while someone came to the phone, she verbalized understanding. Mandy, Clinic Supervisor came on the phone, advised of the above conversation with the mother, she asked me to transfer the call. The mother hung up the phone while I was talking to [place], I gave Angelica Chessman the mother's phone number and she says she will call her back. I called the mother back and advised Angelica Chessman will be calling her shortly, she verbalized understanding.   Reason for Disposition . [1] Depression AND [2] worsening (e.g.,sleeping poorly, less able to do activities of daily living)  Answer Assessment - Initial Assessment Questions 1. CONCERN: "What happened that made you call today?"     Lying in the bed not getting up 2. DEPRESSION SYMPTOM SCREENING: "How are you feeling overall?" (e.g., decreased energy, increased sleeping or difficulty sleeping, difficulty concentrating, feelings of sadness, guilt, hopelessness,  or worthlessness)     Tired 3. RISK OF HARM - SUICIDAL IDEATION:  "Do you ever have thoughts of hurting or killing yourself?"  (e.g., yes, no, no but preoccupation with thoughts about death)   - INTENT:  "Do you have thoughts of hurting or killing yourself right NOW?" (e.g., yes, no, N/A) No   - PLAN: "Do you have a specific plan for how you would do this?" (e.g., gun, knife, overdose, no plan, N/A)     N/A 4. RISK OF HARM - HOMICIDAL IDEATION:  "Do you ever have thoughts of hurting or killing someone else?"  (e.g., yes, no, no but preoccupation with thoughts about death)   - INTENT:  "Do you have thoughts of hurting or killing  someone right NOW?" (e.g., yes, no, N/A) No   - PLAN: "Do you have a specific plan for how you would do this?" (e.g., gun, knife, no plan, N/A)      N/A 5. FUNCTIONAL IMPAIRMENT: "How have things been going for you overall in your life? Have you had any more difficulties than usual doing your normal daily activities?"  (e.g., better, same, worse; self-care, school, work, interactions)     Yes more difficult 6. SUPPORT: "Who is with you now?" "Who do you live with?" "Do you have family or friends nearby who you can talk to?"      Mom 7. THERAPIST: "Do you have a counselor or therapist? Name?"     Yes-first visit in November 8. STRESSORS: "Has there been any new stress or recent changes in your life?"     Yes 9. DRUG ABUSE/ALCOHOL: "Do you drink alcohol or use any illegal drugs?"      Beer occassionally 10. OTHER: "Do you have any other health or medical symptoms right now?" (e.g., fever)       Joint pain 11. PREGNANCY: "Is there any chance you are pregnant?" "When was your last menstrual period?"       No, on period now  Protocols used: DEPRESSION-A-AH

## 2018-07-17 NOTE — Telephone Encounter (Signed)
Noted  

## 2018-07-18 ENCOUNTER — Encounter: Payer: Self-pay | Admitting: Primary Care

## 2018-07-18 ENCOUNTER — Ambulatory Visit (INDEPENDENT_AMBULATORY_CARE_PROVIDER_SITE_OTHER): Admitting: Primary Care

## 2018-07-18 DIAGNOSIS — F419 Anxiety disorder, unspecified: Secondary | ICD-10-CM

## 2018-07-18 DIAGNOSIS — F329 Major depressive disorder, single episode, unspecified: Secondary | ICD-10-CM | POA: Diagnosis not present

## 2018-07-18 MED ORDER — BUPROPION HCL ER (XL) 150 MG PO TB24: 300.0000 mg | ORAL_TABLET | Freq: Every day | ORAL | 1 refills | Status: DC

## 2018-07-18 NOTE — Assessment & Plan Note (Signed)
Deteriorated. Suspect bipolar disorder. Provided patient with a list of psychiatry offices to call for scheduling. We will try to get her in with the  office if possible and are awaiting a call back.  Increase Wellbutrin XL to 300 mg daily. Continue Cymbalta 60 mg. Discussed strict emergency department precautions, she verbalized understanding.

## 2018-07-18 NOTE — Patient Instructions (Signed)
We've increased the dose of your bupropion ER to 300 mg. Take 2 of the 150 mg tablets in your bottle for now. Continue duloxetine (Cymbalta) 60 mg daily.   Call the psychiatry offices listed on the sheet of paper.  Shirlee Limerick will be in touch with you this afternoon.   Go to the hospital if you develop suicidal thoughts and/or feel worse.  Please keep me updated.

## 2018-07-18 NOTE — Progress Notes (Signed)
Subjective:    Patient ID: Nichole Mcbride, female    DOB: Jul 11, 1986, 32 y.o.   MRN: [id number]  HPI  [name] is a 32 year old female with a history of anxiety and depression who presents today to discuss depression.  She is currently managed on bupropion ER 150 mg daily and duloxetine 60 mg daily. Her mother phoned into our office yesterday with reports of increased symptoms of depression including laying in bed all day, cannot take her children to school or fix them breakfast. Her mother denied that the patient was feeling suicidal. Her mother was demanding an appointment yesterday evening at the end of clinic. She was offered an appointment today, or emergency department evaluation yesterday.  Today she's doing better. Several weeks ago she started feeling symptoms of feeling depressed, uncontrollable crying, fatigue, little motivation to do anything. She will then move into feelings of having more energy, is more productive, has more motivation to do things. She doesn't feel as though her medications are helping.  She denies SI/HI. She had an appointment with psychiatry on November 4th but was told this appointment had to be cancelled as her mother had seen this psychiatrist in the past. She was referred to the [facility] office and hasn't heard anything back.  She has had numerous visits in our office for symptoms of depression and medications have been switched and increased accordingly. She was referred to psychiatry in May 2019 but she never heard back regarding an appointment.   Review of Systems  Respiratory: Negative for shortness of breath.   Cardiovascular: Negative for chest pain.  Psychiatric/Behavioral: Negative for suicidal ideas.       See HPI       Past Medical History:  Diagnosis Date  . Carpal tunnel syndrome    bilaterally  . DEPRESSION 06/10/2008  . INSOMNIA 06/10/2008  . Migraine headache   . Raynaud phenomenon   . SCOLIOSIS 06/10/2008  . Sjogren's  disease (HCC)      Social History   Socioeconomic History  . Marital status: Married    Spouse name: Not on file  . Number of children: Not on file  . Years of education: Not on file  . Highest education level: Not on file  Occupational History  . Not on file  Social Needs  . Financial resource strain: Not on file  . Food insecurity:    Worry: Not on file    Inability: Not on file  . Transportation needs:    Medical: Not on file    Non-medical: Not on file  Tobacco Use  . Smoking status: Former Smoker    Types: Cigarettes  . Smokeless tobacco: Never Used  Substance and Sexual Activity  . Alcohol use: Yes  . Drug use: No  . Sexual activity: Not Currently    Birth control/protection: None  Lifestyle  . Physical activity:    Days per week: Not on file    Minutes per session: Not on file  . Stress: Not on file  Relationships  . Social connections:    Talks on phone: Not on file    Gets together: Not on file    Attends religious service: Not on file    Active member of club or organization: Not on file    Attends meetings of clubs or organizations: Not on file    Relationship status: Not on file  . Intimate partner violence:    Fear of current or ex partner: Not on file  Emotionally abused: Not on file    Physically abused: Not on file    Forced sexual activity: Not on file  Other Topics Concern  . Not on file  Social History Narrative   Married.   2 children.   Teaches English.   Enjoys spending time with family.    Past Surgical History:  Procedure Laterality Date  . INTRAUTERINE DEVICE INSERTION  11/05/2009    Family History  Problem Relation Age of Onset  . Diabetes Mother   . Hypertension Mother   . Hypothyroidism Mother   . Hypertension Father   . Hypothyroidism Father     Allergies  Allergen Reactions  . Ciprofloxacin Hcl Other (See Comments)    unknown  . Imitrex [Sumatriptan Succinate] Diarrhea and Nausea And Vomiting    severe     Current Outpatient Medications on File Prior to Visit  Medication Sig Dispense Refill  . buPROPion (WELLBUTRIN XL) 150 MG 24 hr tablet TAKE ONE TABLET BY MOUTH DAILY 90 tablet 1  . butalbital-acetaminophen-caffeine (FIORICET, ESGIC) 50-325-40 MG tablet Take 1-2 tablets by mouth every 8 (eight) hours as needed for headache or migraine. 20 tablet 0  . DULoxetine (CYMBALTA) 60 MG capsule Take 1 capsule (60 mg total) by mouth daily. 90 capsule 0  . valACYclovir (VALTREX) 500 MG tablet Take 1 tablet by mouth twice daily for three days as needed for outbreaks. 6 tablet 1   No current facility-administered medications on file prior to visit.     BP 124/80   Pulse 76   Temp 98.2 F (36.8 C) (Oral)   Ht 5\' 5"  (1.651 m)   Wt 200 lb 12 oz (91.1 kg)   LMP 07/16/2018   SpO2 98%   BMI 33.41 kg/m    Objective:   Physical Exam  Constitutional: She appears well-nourished.  Neck: Neck supple.  Cardiovascular: Normal rate and regular rhythm.  Respiratory: Effort normal and breath sounds normal.  Skin: Skin is warm and dry.  Psychiatric:  Tearful during exam           Assessment & Plan:

## 2018-07-29 ENCOUNTER — Ambulatory Visit: Admitting: Psychiatry
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
## 2018-08-08 ENCOUNTER — Encounter (HOSPITAL_COMMUNITY): Payer: Self-pay | Admitting: Psychiatry

## 2018-08-08 ENCOUNTER — Ambulatory Visit (INDEPENDENT_AMBULATORY_CARE_PROVIDER_SITE_OTHER): Admitting: Psychiatry

## 2018-08-08 VITALS — BP 124/87 | HR 103 | Ht 65.0 in | Wt 197.0 lb

## 2018-08-08 DIAGNOSIS — F331 Major depressive disorder, recurrent, moderate: Secondary | ICD-10-CM

## 2018-08-08 DIAGNOSIS — F419 Anxiety disorder, unspecified: Secondary | ICD-10-CM

## 2018-08-08 DIAGNOSIS — F329 Major depressive disorder, single episode, unspecified: Secondary | ICD-10-CM

## 2018-08-08 DIAGNOSIS — F32A Depression, unspecified: Secondary | ICD-10-CM

## 2018-08-08 MED ORDER — DULOXETINE HCL 30 MG PO CPEP: ORAL_CAPSULE | ORAL | 0 refills | Status: DC

## 2018-08-08 MED ORDER — DULOXETINE HCL 60 MG PO CPEP: ORAL_CAPSULE | ORAL | 0 refills | Status: DC

## 2018-08-08 NOTE — Patient Instructions (Signed)
1. Increase duloxetine 90 mg daily (60 mg + 30 mg) 2. Continue Wellbutrin 300 mg daily  3. Return to clinic in one month for 30 mins 4. Referral to therapy

## 2018-08-20 ENCOUNTER — Ambulatory Visit (HOSPITAL_COMMUNITY): Admitting: Psychiatry

## 2018-08-29 ENCOUNTER — Ambulatory Visit: Admitting: Psychology
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
## 2018-09-03 ENCOUNTER — Other Ambulatory Visit (HOSPITAL_COMMUNITY): Payer: Self-pay | Admitting: Psychiatry

## 2018-09-03 MED ORDER — DULOXETINE HCL 30 MG PO CPEP: ORAL_CAPSULE | ORAL | 0 refills | Status: DC

## 2018-09-09 ENCOUNTER — Ambulatory Visit (HOSPITAL_COMMUNITY): Admitting: Psychiatry

## 2018-09-20 ENCOUNTER — Ambulatory Visit (INDEPENDENT_AMBULATORY_CARE_PROVIDER_SITE_OTHER): Admitting: Psychology

## 2018-09-20 DIAGNOSIS — F332 Major depressive disorder, recurrent severe without psychotic features: Secondary | ICD-10-CM | POA: Diagnosis not present

## 2018-10-01 ENCOUNTER — Other Ambulatory Visit: Payer: Self-pay | Admitting: Primary Care

## 2018-10-01 DIAGNOSIS — F419 Anxiety disorder, unspecified: Principal | ICD-10-CM

## 2018-10-01 DIAGNOSIS — F329 Major depressive disorder, single episode, unspecified: Secondary | ICD-10-CM

## 2018-10-02 ENCOUNTER — Other Ambulatory Visit (HOSPITAL_COMMUNITY): Payer: Self-pay | Admitting: Psychiatry

## 2018-10-02 ENCOUNTER — Telehealth (HOSPITAL_COMMUNITY): Payer: Self-pay | Admitting: *Deleted

## 2018-10-02 DIAGNOSIS — F32A Depression, unspecified: Secondary | ICD-10-CM

## 2018-10-02 DIAGNOSIS — F329 Major depressive disorder, single episode, unspecified: Secondary | ICD-10-CM

## 2018-10-02 DIAGNOSIS — F419 Anxiety disorder, unspecified: Principal | ICD-10-CM

## 2018-10-02 MED ORDER — DULOXETINE HCL 60 MG PO CPEP: ORAL_CAPSULE | ORAL | 0 refills | Status: DC

## 2018-10-02 MED ORDER — DULOXETINE HCL 30 MG PO CPEP: ORAL_CAPSULE | ORAL | 0 refills | Status: DC

## 2018-10-02 NOTE — Telephone Encounter (Signed)
sent 

## 2018-10-02 NOTE — Telephone Encounter (Signed)
Dr Tenny Craw Dr Vanetta Shawl LVM  @ front office requesting refill on Cymbalta. Next appointment 1/2//2020

## 2018-10-10 ENCOUNTER — Ambulatory Visit (INDEPENDENT_AMBULATORY_CARE_PROVIDER_SITE_OTHER): Admitting: Psychology

## 2018-10-10 ENCOUNTER — Other Ambulatory Visit: Payer: Self-pay | Admitting: *Deleted

## 2018-10-10 DIAGNOSIS — F32A Depression, unspecified: Secondary | ICD-10-CM

## 2018-10-10 DIAGNOSIS — F419 Anxiety disorder, unspecified: Principal | ICD-10-CM

## 2018-10-10 DIAGNOSIS — F332 Major depressive disorder, recurrent severe without psychotic features: Secondary | ICD-10-CM | POA: Diagnosis not present

## 2018-10-10 DIAGNOSIS — F329 Major depressive disorder, single episode, unspecified: Secondary | ICD-10-CM

## 2018-10-10 NOTE — Telephone Encounter (Signed)
Spoke to pt who is requesting refill of wellbutrin. She states her dosing was increased to two tablets daily. Last Rx was set to "no print." Pt states she contacted behavioral health to have it refilled, but was advised to contact PCP, who made most recent changes. pls advise

## 2018-10-11 MED ORDER — BUPROPION HCL ER (XL) 150 MG PO TB24: 300.0000 mg | ORAL_TABLET | Freq: Every day | ORAL | 1 refills | Status: DC

## 2018-10-11 NOTE — Telephone Encounter (Signed)
Medication sent to pharmacy  

## 2018-10-11 NOTE — Telephone Encounter (Signed)
Pt called office requesting a refill on the Wellbutrin. Pt is going out of town this evening.
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
## 2018-10-22 ENCOUNTER — Ambulatory Visit (HOSPITAL_COMMUNITY): Admitting: Psychiatry

## 2018-10-24 ENCOUNTER — Ambulatory Visit: Admitting: Psychology

## 2018-10-31 ENCOUNTER — Other Ambulatory Visit (HOSPITAL_COMMUNITY): Payer: Self-pay | Admitting: Psychiatry

## 2018-11-14 ENCOUNTER — Ambulatory Visit: Admitting: Psychology

## 2018-12-04 ENCOUNTER — Ambulatory Visit: Payer: Self-pay | Admitting: Psychology

## 2018-12-12 ENCOUNTER — Other Ambulatory Visit: Payer: Self-pay | Admitting: Primary Care

## 2018-12-12 DIAGNOSIS — F329 Major depressive disorder, single episode, unspecified: Secondary | ICD-10-CM

## 2018-12-12 DIAGNOSIS — F419 Anxiety disorder, unspecified: Principal | ICD-10-CM

## 2019-01-30 ENCOUNTER — Ambulatory Visit (INDEPENDENT_AMBULATORY_CARE_PROVIDER_SITE_OTHER): Admitting: Psychiatry

## 2019-01-30 ENCOUNTER — Encounter (HOSPITAL_COMMUNITY): Payer: Self-pay | Admitting: Psychiatry

## 2019-01-30 DIAGNOSIS — F330 Major depressive disorder, recurrent, mild: Secondary | ICD-10-CM

## 2019-01-30 MED ORDER — DULOXETINE HCL 30 MG PO CPEP: ORAL_CAPSULE | ORAL | 0 refills | Status: DC

## 2019-01-30 MED ORDER — DULOXETINE HCL 60 MG PO CPEP: ORAL_CAPSULE | ORAL | 0 refills | Status: DC
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
## 2019-02-09 ENCOUNTER — Telehealth: Admitting: Family

## 2019-02-09 DIAGNOSIS — Z20822 Contact with and (suspected) exposure to covid-19: Secondary | ICD-10-CM

## 2019-02-09 NOTE — Progress Notes (Signed)
E-Visit for Corona Virus Screening  Based on your current symptoms, you may very well have the virus, however your symptoms are mild. Currently, not all patients are being tested. If the symptoms are mild and there is not a known exposure, performing the test is not indicated.  Approximately 5 minutes was spent documenting and reviewing patient's chart.    You have been enrolled in MyChart Home Monitoring for COVID-19. Daily you will receive a questionnaire within the MyChart website. Our COVID-19 response team will be monitoring your responses daily.   Coronavirus disease 2019 (COVID-19) is a respiratory illness that can spread from person to person. The virus that causes COVID-19 is a new virus that was first identified in the country of [place] but is now found in multiple other countries and has spread to the [place].  Symptoms associated with the virus are mild to severe fever, cough, and shortness of breath. There is currently no vaccine to protect against COVID-19, and there is no specific antiviral treatment for the virus.   To be considered HIGH RISK for Coronavirus (COVID-19), you have to meet the following criteria:  . Traveled to [place], [place], [place], [place] or [place]; or in the [place] to [place], [place], [place], or [place]; and have fever, cough, and shortness of breath within the last 2 weeks of travel OR  . Been in close contact with a person diagnosed with COVID-19 within the last 2 weeks and have fever, cough, and shortness of breath  . IF YOU DO NOT MEET THESE CRITERIA, YOU ARE CONSIDERED LOW RISK FOR COVID-19.   It is vitally important that if you feel that you have an infection such as this virus or any other virus that you stay home and away from places where you may spread it to others.  You should self-quarantine for 14 days if you have symptoms that could potentially be coronavirus and avoid contact with people age 65 and older.    You may also  take acetaminophen (Tylenol) as needed for fever.   Reduce your risk of any infection by using the same precautions used for avoiding the common cold or flu:  . Wash your hands often with soap and warm water for at least 20 seconds.  If soap and water are not readily available, use an alcohol-based hand sanitizer with at least 60% alcohol.  . If coughing or sneezing, cover your mouth and nose by coughing or sneezing into the elbow areas of your shirt or coat, into a tissue or into your sleeve (not your hands). . Avoid shaking hands with others and consider head nods or verbal greetings only. . Avoid touching your eyes, nose, or mouth with unwashed hands.  . Avoid close contact with people who are sick. . Avoid places or events with large numbers of people in one location, like concerts or sporting events. . Carefully consider travel plans you have or are making. . If you are planning any travel outside or inside the [place], visit the CDC's Travelers' Health webpage for the latest health notices. . If you have some symptoms but not all symptoms, continue to monitor at home and seek medical attention if your symptoms worsen. . If you are having a medical emergency, call 911.  HOME CARE . Only take medications as instructed by your medical team. . Drink plenty of fluids and get plenty of rest. . A steam or ultrasonic humidifier can help if you have congestion.   GET   HELP RIGHT AWAY IF: . You develop worsening fever. . You become short of breath . You cough up blood. . Your symptoms become more severe MAKE SURE YOU   Understand these instructions.  Will watch your condition.  Will get help right away if you are not doing well or get worse.  Your e-visit answers were reviewed by a board certified advanced clinical practitioner to complete your personal care plan.  Depending on the condition, your plan could have included both over the counter or prescription medications.  If there is a  problem please reply once you have received a response from your provider. Your safety is important to us.  If you have drug allergies check your prescription carefully.    You can use MyChart to ask questions about today's visit, request a non-urgent call back, or ask for a work or school excuse for 24 hours related to this e-Visit. If it has been greater than 24 hours you will need to follow up with your provider, or enter a new e-Visit to address those concerns. You will get an e-mail in the next two days asking about your experience.  I hope that your e-visit has been valuable and will speed your recovery. Thank you for using e-visits.    

## 2019-02-10 ENCOUNTER — Encounter (INDEPENDENT_AMBULATORY_CARE_PROVIDER_SITE_OTHER): Payer: Self-pay

## 2019-02-10 ENCOUNTER — Ambulatory Visit (INDEPENDENT_AMBULATORY_CARE_PROVIDER_SITE_OTHER): Admitting: Primary Care

## 2019-02-10 ENCOUNTER — Telehealth: Payer: Self-pay

## 2019-02-10 DIAGNOSIS — R509 Fever, unspecified: Secondary | ICD-10-CM

## 2019-02-10 NOTE — Telephone Encounter (Signed)
Patient had virtual visit encounter today with Pcp, Vernona Rieger, NP.  Please refer to visit encounter for details.

## 2019-02-10 NOTE — Progress Notes (Signed)
Subjective:    Patient ID: Nichole Mcbride, female    DOB: 08-Apr-1986, 33 y.o.   MRN: [id number]  HPI  Virtual Visit via Video Note  I connected with Nichole Mcbride on 02/10/19 at  9:20 AM EDT by a video enabled telemedicine application and verified that I am speaking with the correct person using two identifiers.  Location: Patient: Home Provider: Office   I discussed the limitations of evaluation and management by telemedicine and the availability of in person appointments. The patient expressed understanding and agreed to proceed.  History of Present Illness:  Nichole Mcbride is a 33 year old female who presents today with ac hief complaint of fever.  Her symptoms began three evenings ago with body aches. She then developed fever, sore throat, headaches. Her fevers are running 99-101. She's been taking Aspirin and Tylenol. She denies vomiting, shortness of breath, cough, exposure to Covid-19. She has been out of the house recently for essential items but has been wearing a mask.   She completed an E-Visit on 02/09/19 and was told that her symptoms were suspicious for Covid-19 and were overall mild.    Observations/Objective:  Alert and oriented. Appears to not be feeling well. No distress. Speaking in complete sentences.   Assessment and Plan:  Symptoms suspicious for Covid-19 and given recent lifting of stay at home orders it's quite likely. Commended her on wearing a mask in public. She appears tired but not sickly.  I provided her with the number for the health department testing site for Covid-19, she will call.  Discussed use of Tylenol. Also discussed that she needs to isolate for 7 days from symptom onset and cannot be released until day 7 and is without fevers for 3 days (without Tylenol/Aspirin, etc). She verbalized understanding.   Follow Up Instructions:  Call the [facility] testing site at [phone] as discussed.  You must isolate yourself  for 7 days after symptom onset and cannot come out of isolation until then and once you've been fever free for three days without the use of Tylenol/Aspirin/Ibuprofen.  You may take Tylenol for fevers, do not exceed 3000 mg in 24 hours.  It was a pleasure to see you today! Mayra Reel, NP-C    I discussed the assessment and treatment plan with the patient. The patient was provided an opportunity to ask questions and all were answered. The patient agreed with the plan and demonstrated an understanding of the instructions.   The patient was advised to call back or seek an in-person evaluation if the symptoms worsen or if the condition fails to improve as anticipated.     Doreene Nest, NP    Review of Systems  Constitutional: Positive for fatigue and fever.  HENT: Positive for sore throat. Negative for congestion and postnasal drip.   Respiratory: Negative for cough and shortness of breath.   Gastrointestinal: Positive for abdominal pain. Negative for diarrhea.  Neurological: Positive for headaches.       Past Medical History:  Diagnosis Date  . Carpal tunnel syndrome    bilaterally  . DEPRESSION 06/10/2008  . INSOMNIA 06/10/2008  . Migraine headache   . Raynaud phenomenon   . SCOLIOSIS 06/10/2008  . Sjogren's disease (HCC)      Social History   Socioeconomic History  . Marital status: Married    Spouse name: Not on file  . Number of children: Not on file  . Years of education: Not on file  . Highest  education level: Not on file  Occupational History  . Not on file  Social Needs  . Financial resource strain: Not on file  . Food insecurity:    Worry: Not on file    Inability: Not on file  . Transportation needs:    Medical: Not on file    Non-medical: Not on file  Tobacco Use  . Smoking status: Former Smoker    Types: Cigarettes  . Smokeless tobacco: Never Used  Substance and Sexual Activity  . Alcohol use: Yes  . Drug use: No  . Sexual activity: Not  Currently    Birth control/protection: None  Lifestyle  . Physical activity:    Days per week: Not on file    Minutes per session: Not on file  . Stress: Not on file  Relationships  . Social connections:    Talks on phone: Not on file    Gets together: Not on file    Attends religious service: Not on file    Active member of club or organization: Not on file    Attends meetings of clubs or organizations: Not on file    Relationship status: Not on file  . Intimate partner violence:    Fear of current or ex partner: Not on file    Emotionally abused: Not on file    Physically abused: Not on file    Forced sexual activity: Not on file  Other Topics Concern  . Not on file  Social History Narrative   Married.   2 children.   Teaches English.   Enjoys spending time with family.    Past Surgical History:  Procedure Laterality Date  . INTRAUTERINE DEVICE INSERTION  11/05/2009    Family History  Problem Relation Age of Onset  . Diabetes Mother   . Hypertension Mother   . Hypothyroidism Mother   . Depression Mother   . Hypertension Father   . Hypothyroidism Father   . Depression Brother   . Bipolar disorder Cousin     Allergies  Allergen Reactions  . Ciprofloxacin Hcl Other (See Comments)    unknown  . Imitrex [Sumatriptan Succinate] Diarrhea and Nausea And Vomiting    severe    Current Outpatient Medications on File Prior to Visit  Medication Sig Dispense Refill  . buPROPion (WELLBUTRIN XL) 150 MG 24 hr tablet TAKE ONE TABLET BY MOUTH DAILY 90 tablet 0  . butalbital-acetaminophen-caffeine (FIORICET, ESGIC) 50-325-40 MG tablet Take 1-2 tablets by mouth every 8 (eight) hours as needed for headache or migraine. 20 tablet 0  . DULoxetine (CYMBALTA) 30 MG capsule Total of 90 mg daily (60 mg + 30 mg) 90 capsule 0  . DULoxetine (CYMBALTA) 60 MG capsule Total of 90 mg daily (60 mg + 30 mg) 90 capsule 0  . valACYclovir (VALTREX) 500 MG tablet Take 1 tablet by mouth twice  daily for three days as needed for outbreaks. 6 tablet 1   No current facility-administered medications on file prior to visit.     There were no vitals taken for this visit.   Objective:   Physical Exam  Constitutional: She is oriented to person, place, and time. She appears well-nourished.  Appears tired  Neck: Neck supple.  Respiratory: Effort normal.  Neurological: She is alert and oriented to person, place, and time.  Skin: Skin is dry.  Psychiatric: She has a normal mood and affect.           Assessment & Plan:

## 2019-02-10 NOTE — Telephone Encounter (Signed)
[facility] Night - Client TELEPHONE ADVICE RECORD [facility] Patient Name: Nichole Mcbride Gender: Female DOB: 08-08-1986 Age: 33 Y 1 M 14 D Return Phone Number: [phone] (Primary), [phone] (Secondary) Address: City/State/Zip: [place] [address] Client [facility] Night - Client Client Site  [facility] - Night Physician Vernona Rieger - NP Contact Type Call Who Is Calling Patient / Member / Family / Caregiver Call Type Triage / Clinical Caller Name Juliette Mangle Relationship To Patient Mother Return Phone Number [phone] (Primary) Chief Complaint Headache Reason for Call Symptomatic / Request for Health Information Initial Comment Caller states daughter running fever since Fri, severe chills, headache, body aches. She is asking if she needs to be tested for cv19. Dr name and office location verified Translation No Nurse Assessment Nurse: Ladona Ridgel, RN, Nedra Hai Date/Time Lamount Cohen Time): 02/09/2019 10:31:08 AM Confirm and document reason for call. If symptomatic, describe symptoms. ---Caller is not with the patient but wants to know how to get covid test done. Has the patient had close contact with a person known or suspected to have the novel coronavirus illness OR traveled / lives in area with major community spread (including international travel) in the last 14 days from the onset of symptoms? * If Asymptomatic, screen for exposure and travel within the last 14 days. ---Not Applicable Does the patient have any new or worsening symptoms? ---No Please document clinical information provided and list any resource used. ---caller was emailed instructions for [place] care now televisits. Guidelines Guideline Title Affirmed Question Affirmed Notes Nurse Date/Time (Eastern Time) Disp. Time Lamount Cohen Time) Disposition Final User 02/09/2019 10:33:57 AM Clinical Call Yes Ladona Ridgel, RN,  Nedra Hai After Care Instructions Given Call Event Type User Date / Time Description Education document email Clarnce Flock 02/09/2019 10:32:12 AM [facility] Connect Now Instructions

## 2019-02-10 NOTE — Patient Instructions (Signed)
Call the [facility] testing site at [phone] as discussed.  You must isolate yourself for 7 days after symptom onset and cannot come out of isolation until then and once you've been fever free for three days without the use of Tylenol/Aspirin/Ibuprofen.  You may take Tylenol for fevers, do not exceed 3000 mg in 24 hours.  It was a pleasure to see you today! Mayra Reel, NP-C

## 2019-02-12 ENCOUNTER — Encounter (INDEPENDENT_AMBULATORY_CARE_PROVIDER_SITE_OTHER): Payer: Self-pay

## 2019-04-10 ENCOUNTER — Other Ambulatory Visit: Payer: Self-pay | Admitting: Primary Care

## 2019-04-10 DIAGNOSIS — F419 Anxiety disorder, unspecified: Secondary | ICD-10-CM

## 2019-04-10 DIAGNOSIS — F329 Major depressive disorder, single episode, unspecified: Secondary | ICD-10-CM

## 2019-04-10 NOTE — Telephone Encounter (Signed)
Patient currently following with psychiatry.  Please forward to psychiatrist.

## 2019-04-10 NOTE — Telephone Encounter (Signed)
Last prescribed on 12/12/2018. Last appointment on 02/10/2019. No future appointment

## 2019-04-11 ENCOUNTER — Other Ambulatory Visit (HOSPITAL_COMMUNITY): Payer: Self-pay | Admitting: Psychiatry

## 2019-04-11 DIAGNOSIS — F419 Anxiety disorder, unspecified: Secondary | ICD-10-CM

## 2019-04-11 DIAGNOSIS — F329 Major depressive disorder, single episode, unspecified: Secondary | ICD-10-CM

## 2019-04-11 MED ORDER — BUPROPION HCL ER (XL) 300 MG PO TB24: 300.0000 mg | ORAL_TABLET | Freq: Every day | ORAL | 0 refills | Status: DC

## 2019-04-11 NOTE — Telephone Encounter (Signed)
Ordered refill. Dose was changed to 300 mg daily

## 2019-04-17 ENCOUNTER — Telehealth: Payer: Self-pay

## 2019-04-17 NOTE — Telephone Encounter (Signed)
Pt has fever 99.9, S/T,pt can see bleeding at the back of her throat; pt traveled to a [employer] graduation in [place] and someone there had + covid testing and pt is not sure if came in contact with that person or not.pt having some difficulty in talking ? Due to S/T. Advised pt to go to [facility] ED and advise them of possible exposure to covid. 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 FYI to Gentry Fitz NP.
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
## 2019-04-17 NOTE — Telephone Encounter (Signed)
Urgent care would be fine as well.

## 2019-04-25 ENCOUNTER — Ambulatory Visit (HOSPITAL_COMMUNITY): Admitting: Psychiatry

## 2019-04-25 ENCOUNTER — Other Ambulatory Visit: Payer: Self-pay

## 2019-04-25 ENCOUNTER — Telehealth (HOSPITAL_COMMUNITY): Payer: Self-pay | Admitting: Psychiatry

## 2019-04-25 NOTE — Telephone Encounter (Signed)
She answered a phone after a few attempts. She would like to reschedule the appointment as her dog just died. Will reschedule to 05/06/2023 at 11:20.

## 2019-04-28 ENCOUNTER — Telehealth (HOSPITAL_COMMUNITY): Payer: Self-pay | Admitting: Psychiatry

## 2019-04-28 ENCOUNTER — Ambulatory Visit (HOSPITAL_COMMUNITY): Admitting: Psychiatry

## 2019-04-28 ENCOUNTER — Other Ambulatory Visit: Payer: Self-pay

## 2019-04-28 NOTE — Telephone Encounter (Signed)
Sent link for video visit through [employer] [facility]. Patient did not sign in. Called the patient  twice for appointment scheduled today. The patient did not answer the phone. No option to leave voice message.  

## 2019-08-06 NOTE — Progress Notes (Signed)
Doreene Nest, NP   Chief Complaint  Patient presents with  . Bartholin's Cyst    since Sat    HPI:      [name] is a 33 y.o. [id number] who LMP was Patient's last menstrual period was 07/30/2019 (exact date)., presents today for painful Bartholin's cyst for 6 days. Doing sitz baths without relief. No drainage, no fevers, no vag sx. Hx of Bartholin's cyst about 10 yrs.  Current on pap.   Patient Active Problem List   Diagnosis Date Noted  . MDD (major depressive disorder), recurrent episode, moderate (HCC) 08/08/2018  . Prediabetes 01/25/2018  . Supervision of other normal pregnancy, antepartum 07/09/2017  . BMI 31.0-31.9,adult 07/09/2017  . Rash and nonspecific skin eruption 06/15/2016  . 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 Encounter for routine adult medical exam with abnormal findings 05/04/2016  . Fibromyalgia 03/23/2016  . Anxiety and depression 03/23/2016  . Sjogren's disease (HCC) 03/23/2016  . Migraine headache 10/14/2011  . Raynaud's disease 09/20/2011  . SCOLIOSIS 06/10/2008    Past Surgical History:  Procedure Laterality Date  . INTRAUTERINE DEVICE INSERTION  11/05/2009    Family History  Problem Relation Age of Onset  . Diabetes Mother   . Hypertension Mother   . Hypothyroidism Mother   . Depression Mother   . Hypertension Father   . Hypothyroidism Father   . Depression Brother   . Bipolar disorder Cousin     Social History   Socioeconomic History  . Marital status: Married    Spouse name: Not on file  . Number of children: Not on file  . Years of education: Not on file  . Highest education level: Not on file  Occupational History  . Not on file  Social Needs  . Financial resource strain: Not on file  . Food insecurity    Worry: Not on file    Inability: Not on file  . Transportation needs    Medical: Not on file    Non-medical: Not on file  Tobacco Use  . Smoking status: Former Smoker    Types: Cigarettes  .  Smokeless tobacco: Never Used  Substance and Sexual Activity  . Alcohol use: Yes  . Drug use: No  . Sexual activity: Not Currently    Birth control/protection: None  Lifestyle  . Physical activity    Days per week: Not on file    Minutes per session: Not on file  . Stress: Not on file  Relationships  . Social Musician on phone: Not on file    Gets together: Not on file    Attends religious service: Not on file    Active member of club or organization: Not on file    Attends meetings of clubs or organizations: Not on file    Relationship status: Not on file  . Intimate partner violence    Fear of current or ex partner: Not on file    Emotionally abused: Not on file    Physically abused: Not on file    Forced sexual activity: Not on file  Other Topics Concern  . Not on file  Social History Narrative   Married.   2 children.   Teaches English.   Enjoys spending time with family.    Outpatient Medications Prior to Visit  Medication Sig Dispense Refill  . valACYclovir (VALTREX) 500 MG tablet Take 1 tablet by mouth twice daily for three days as needed for outbreaks. (Patient not  taking: Reported on 08/07/2019) 6 tablet 1  . buPROPion (WELLBUTRIN XL) 150 MG 24 hr tablet TAKE ONE TABLET BY MOUTH DAILY 90 tablet 0  . buPROPion (WELLBUTRIN XL) 300 MG 24 hr tablet Take 1 tablet (300 mg total) by mouth daily. 90 tablet 0  . DULoxetine (CYMBALTA) 30 MG capsule Total of 90 mg daily (60 mg + 30 mg) 90 capsule 0  . DULoxetine (CYMBALTA) 60 MG capsule Total of 90 mg daily (60 mg + 30 mg) 90 capsule 0   No facility-administered medications prior to visit.       ROS:  Review of Systems  Constitutional: Negative for fever.  Gastrointestinal: Negative for blood in stool, constipation, diarrhea, nausea and vomiting.  Genitourinary: Positive for vaginal pain. Negative for dyspareunia, dysuria, flank pain, frequency, hematuria, urgency, vaginal bleeding and vaginal discharge.   Musculoskeletal: Negative for back pain.  Skin: Negative for rash.  BREAST: No symptoms   OBJECTIVE:   Vitals:  BP 100/80   Ht 5\' 5"  (1.651 m)   Wt 197 lb (89.4 kg)   LMP 07/30/2019 (Exact Date)   Breastfeeding No   BMI 32.78 kg/m   Physical Exam Vitals signs reviewed.  Constitutional:      Appearance: She is well-developed.  Neck:     Musculoskeletal: Normal range of motion.  Pulmonary:     Effort: Pulmonary effort is normal.  Genitourinary:    General: Normal vulva.     Labia:        Right: Tenderness and lesion present.        Left: No rash, tenderness, lesion or injury.     Musculoskeletal: Normal range of motion.  Skin:    General: Skin is warm and dry.  Neurological:     General: No focal deficit present.     Mental Status: She is alert and oriented to person, place, and time.     Cranial Nerves: No cranial nerve deficit.  Psychiatric:        Mood and Affect: Mood normal.        Behavior: Behavior normal.        Thought Content: Thought content normal.        Judgment: Judgment normal.     Assessment/Plan: Labial abscess - Plan: doxycycline (VIBRAMYCIN) 100 MG capsule; RT side. Rx doxy, cont sitz baths. No place to I&D. F/u prn.    Meds ordered this encounter  Medications  . doxycycline (VIBRAMYCIN) 100 MG capsule    Sig: Take 1 capsule (100 mg total) by mouth 2 (two) times daily for 10 days.    Dispense:  20 capsule    Refill:  0    Order Specific Question:   Supervising Provider    Answer:   Gae Dry [[id number]]      Return if symptoms worsen or fail to improve.  Ruthy Forry B. Amarrah Meinhart, PA-C 08/07/2019 9:54 AM

## 2019-08-07 ENCOUNTER — Other Ambulatory Visit: Payer: Self-pay

## 2019-08-07 ENCOUNTER — Ambulatory Visit (INDEPENDENT_AMBULATORY_CARE_PROVIDER_SITE_OTHER): Admitting: Obstetrics and Gynecology

## 2019-08-07 ENCOUNTER — Encounter: Payer: Self-pay | Admitting: Obstetrics and Gynecology

## 2019-08-07 VITALS — BP 100/80 | Ht 65.0 in | Wt 197.0 lb

## 2019-08-07 DIAGNOSIS — N764 Abscess of vulva: Secondary | ICD-10-CM | POA: Diagnosis not present

## 2019-08-07 MED ORDER — DOXYCYCLINE HYCLATE 100 MG PO CAPS: 100.0000 mg | ORAL_CAPSULE | Freq: Two times a day (BID) | ORAL | 0 refills | Status: AC

## 2019-08-07 NOTE — Patient Instructions (Signed)
I value your feedback and entrusting us with your care. If you get a [facility] patient survey, I would appreciate you taking the time to let us know about your experience today. Thank you! 

## 2019-08-29 ENCOUNTER — Other Ambulatory Visit: Payer: Self-pay | Admitting: Obstetrics and Gynecology

## 2019-08-29 ENCOUNTER — Encounter: Payer: Self-pay | Admitting: Obstetrics and Gynecology

## 2019-08-29 MED ORDER — CEPHALEXIN 500 MG PO CAPS: 500.0000 mg | ORAL_CAPSULE | Freq: Two times a day (BID) | ORAL | 0 refills | Status: AC

## 2019-08-29 NOTE — Progress Notes (Signed)
Rx keflex for labial cyst.

## 2020-03-25 DIAGNOSIS — Z419 Encounter for procedure for purposes other than remedying health state, unspecified: Secondary | ICD-10-CM | POA: Diagnosis not present

## 2020-04-25 DIAGNOSIS — Z419 Encounter for procedure for purposes other than remedying health state, unspecified: Secondary | ICD-10-CM | POA: Diagnosis not present

## 2020-04-29 ENCOUNTER — Encounter: Payer: Self-pay | Admitting: Primary Care

## 2020-04-29 ENCOUNTER — Ambulatory Visit (INDEPENDENT_AMBULATORY_CARE_PROVIDER_SITE_OTHER): Payer: Medicaid Other | Admitting: Primary Care

## 2020-04-29 ENCOUNTER — Other Ambulatory Visit: Payer: Self-pay

## 2020-04-29 DIAGNOSIS — R0781 Pleurodynia: Secondary | ICD-10-CM | POA: Insufficient documentation

## 2020-04-29 HISTORY — DX: Pleurodynia: R07.81

## 2020-04-29 MED ORDER — PREDNISONE 20 MG PO TABS: ORAL_TABLET | ORAL | 0 refills | Status: DC
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
## 2020-04-29 NOTE — Patient Instructions (Signed)
Start prednisone 20 mg tablets for pain and inflammation. Take 2 tablet daily for give days.  Do not take Ibuprofen for now.  Take Tylenol 1000 mg every 8 hours.  Continue heat/ice.  Continue deep breathing exercises.   It was a pleasure to see you today!

## 2020-05-26 DIAGNOSIS — Z419 Encounter for procedure for purposes other than remedying health state, unspecified: Secondary | ICD-10-CM | POA: Diagnosis not present

## 2020-05-28 ENCOUNTER — Other Ambulatory Visit: Payer: Self-pay | Admitting: Primary Care

## 2020-05-28 DIAGNOSIS — Z1159 Encounter for screening for other viral diseases: Secondary | ICD-10-CM

## 2020-05-28 DIAGNOSIS — R7303 Prediabetes: Secondary | ICD-10-CM

## 2020-05-28 DIAGNOSIS — E559 Vitamin D deficiency, unspecified: Secondary | ICD-10-CM

## 2020-06-08 ENCOUNTER — Other Ambulatory Visit (INDEPENDENT_AMBULATORY_CARE_PROVIDER_SITE_OTHER): Payer: Medicaid Other

## 2020-06-08 ENCOUNTER — Other Ambulatory Visit: Payer: Self-pay

## 2020-06-08 DIAGNOSIS — Z1159 Encounter for screening for other viral diseases: Secondary | ICD-10-CM

## 2020-06-08 DIAGNOSIS — E559 Vitamin D deficiency, unspecified: Secondary | ICD-10-CM

## 2020-06-08 DIAGNOSIS — R7303 Prediabetes: Secondary | ICD-10-CM

## 2020-06-08 LAB — COMPREHENSIVE METABOLIC PANEL
ALT: 6 U/L (ref 0–35)
AST: 15 U/L (ref 0–37)
Albumin: 4.1 g/dL (ref 3.5–5.2)
Alkaline Phosphatase: 53 U/L (ref 39–117)
BUN: 10 mg/dL (ref 6–23)
CO2: 28 mEq/L (ref 19–32)
Calcium: 9.2 mg/dL (ref 8.4–10.5)
Chloride: 104 mEq/L (ref 96–112)
Creatinine, Ser: 0.83 mg/dL (ref 0.40–1.20)
GFR: 79.44 mL/min (ref >60.00)
Glucose, Bld: 93 mg/dL (ref 70–99)
Potassium: 4.6 mEq/L (ref 3.5–5.1)
Sodium: 139 mEq/L (ref 135–145)
Total Bilirubin: 0.5 mg/dL (ref 0.2–1.2)
Total Protein: 6.8 g/dL (ref 6.0–8.3)

## 2020-06-08 LAB — CBC
HCT: 39 % (ref 36.0–46.0)
Hemoglobin: 12.8 g/dL (ref 12.0–15.0)
MCHC: 32.9 g/dL (ref 30.0–36.0)
MCV: 80.2 fl (ref 78.0–100.0)
Platelets: 285 10*3/uL (ref 150.0–400.0)
RBC: 4.86 Mil/uL (ref 3.87–5.11)
RDW: 19.3 % — ABNORMAL HIGH (ref 11.5–15.5)
WBC: 6.6 10*3/uL (ref 4.0–10.5)

## 2020-06-08 LAB — VITAMIN D 25 HYDROXY (VIT D DEFICIENCY, FRACTURES): VITD: 28.41 ng/mL — ABNORMAL LOW (ref 30.00–100.00)

## 2020-06-08 LAB — LIPID PANEL
Cholesterol: 203 mg/dL — ABNORMAL HIGH (ref 0–200)
HDL: 54.9 mg/dL (ref >39.00)
LDL Cholesterol: 133 mg/dL — ABNORMAL HIGH (ref 0–99)
NonHDL: 147.62
Total CHOL/HDL Ratio: 4
Triglycerides: 72 mg/dL (ref 0.0–149.0)
VLDL: 14.4 mg/dL (ref 0.0–40.0)

## 2020-06-08 LAB — HEMOGLOBIN A1C: Hgb A1c MFr Bld: 6 % (ref 4.6–6.5)

## 2020-06-09 LAB — HEPATITIS C ANTIBODY
Hepatitis C Ab: NONREACTIVE
SIGNAL TO CUT-OFF: 0.01 (ref <1.00)

## 2020-06-15 ENCOUNTER — Encounter: Payer: Medicaid Other | Admitting: Primary Care

## 2020-06-25 DIAGNOSIS — Z419 Encounter for procedure for purposes other than remedying health state, unspecified: Secondary | ICD-10-CM | POA: Diagnosis not present

## 2020-07-26 ENCOUNTER — Encounter: Payer: Medicaid Other | Admitting: Primary Care

## 2020-07-26 DIAGNOSIS — Z419 Encounter for procedure for purposes other than remedying health state, unspecified: Secondary | ICD-10-CM | POA: Diagnosis not present

## 2020-07-28 ENCOUNTER — Other Ambulatory Visit: Payer: Self-pay

## 2020-07-28 ENCOUNTER — Other Ambulatory Visit (HOSPITAL_COMMUNITY)
Admission: RE | Admit: 2020-07-28 | Discharge: 2020-07-28 | Disposition: A | Discharge status: Home / Self Care | Payer: Medicaid Other | Source: Ambulatory Visit | Attending: Primary Care | Admitting: Primary Care

## 2020-07-28 ENCOUNTER — Ambulatory Visit (INDEPENDENT_AMBULATORY_CARE_PROVIDER_SITE_OTHER): Payer: Medicaid Other | Admitting: Primary Care

## 2020-07-28 ENCOUNTER — Encounter: Payer: Self-pay | Admitting: Primary Care

## 2020-07-28 VITALS — BP 108/68 | HR 65 | Temp 98.3°F | Ht 64.96 in | Wt 168.4 lb

## 2020-07-28 DIAGNOSIS — Z0000 Encounter for general adult medical examination without abnormal findings: Secondary | ICD-10-CM

## 2020-07-28 DIAGNOSIS — G43901 Migraine, unspecified, not intractable, with status migrainosus: Secondary | ICD-10-CM | POA: Diagnosis not present

## 2020-07-28 DIAGNOSIS — B001 Herpesviral vesicular dermatitis: Secondary | ICD-10-CM | POA: Diagnosis not present

## 2020-07-28 DIAGNOSIS — F419 Anxiety disorder, unspecified: Secondary | ICD-10-CM

## 2020-07-28 DIAGNOSIS — Z23 Encounter for immunization: Secondary | ICD-10-CM

## 2020-07-28 DIAGNOSIS — F32A Depression, unspecified: Secondary | ICD-10-CM

## 2020-07-28 DIAGNOSIS — R7303 Prediabetes: Secondary | ICD-10-CM

## 2020-07-28 DIAGNOSIS — Z113 Encounter for screening for infections with a predominantly sexual mode of transmission: Secondary | ICD-10-CM

## 2020-07-28 DIAGNOSIS — Z124 Encounter for screening for malignant neoplasm of cervix: Secondary | ICD-10-CM

## 2020-07-28 MED ORDER — PROPRANOLOL HCL ER 80 MG PO CP24: 80.0000 mg | ORAL_CAPSULE | Freq: Every day | ORAL | 0 refills | Status: DC

## 2020-07-28 MED ORDER — VALACYCLOVIR HCL 500 MG PO TABS: ORAL_TABLET | ORAL | 1 refills | Status: DC

## 2020-07-28 NOTE — Assessment & Plan Note (Addendum)
Over the last 2-3 months she has been experiencing almost daily headaches and once weekly a migraine with photosensitivity.  Given frequency will start propranolol daily for migraine prevention.  She will follow up in 1 month.     Agree with assessment and plan. Doreene Nest, NP

## 2020-07-28 NOTE — Progress Notes (Signed)
   Subjective:    Patient ID: Nichole Mcbride, female    DOB: May 01, 1986, 34 y.o.   MRN: [id number]  HPI   This visit occurred during the SARS-CoV-2 public health emergency.  Safety protocols were in place, including screening questions prior to the visit, additional usage of staff PPE, and extensive cleaning of exam room while observing appropriate contact time as indicated for disinfecting solutions.   [name] is a 34 year old female patient with a history of migraines, scoliosis, fibromyagia, anxiety, prediabetes and Sjogren disease who presents today for complete physical.  Migraines:  She is currently experiencing a migraine about once a week along with almost daily headaches. She will take Excedrin and lay down when she has a migraine which normally helps. Associated with sensitivity to light. She denies any change in vision or dizziness. She has tired Imitrex in the past but this made her feel bad.      Immunizations: -Tetanus: Updated today  -Influenza: Declines today  -Covid-19: Completed series   Diet: She has been cooking and working on diet, avoiding red meats mostly chicken does eat a lot of rice.   Exercise: Self defense classes 6 days week.   Eye exam: Over 1 year, due  Dental exam: Over 1 year, due   Pap Smear: Due , updated today     Review of Systems  Constitutional: Negative.  Negative for chills, fatigue and fever.  HENT: Negative.   Eyes: Negative.   Respiratory: Negative for chest tightness and shortness of breath.   Cardiovascular: Negative for chest pain.  Gastrointestinal: Negative for abdominal pain and constipation.  Endocrine: Negative.   Genitourinary: Negative.   Musculoskeletal: Negative.   Allergic/Immunologic: Negative.   Neurological: Positive for headaches. Negative for dizziness, syncope, weakness, light-headedness and numbness.  Hematological: Negative.   Psychiatric/Behavioral: Negative.         Objective:   Physical  Exam Constitutional:      Appearance: Normal appearance.  HENT:     Head: Normocephalic.     Right Ear: Tympanic membrane normal.     Left Ear: Tympanic membrane normal.  Eyes:     Extraocular Movements: Extraocular movements intact.     Pupils: Pupils are equal, round, and reactive to light.  Cardiovascular:     Rate and Rhythm: Normal rate and regular rhythm.  Abdominal:     General: Abdomen is flat. Bowel sounds are normal. There is no distension.     Tenderness: There is no abdominal tenderness. There is no guarding.     Hernia: No hernia is present.  Musculoskeletal:        General: Normal range of motion.  Skin:    General: Skin is warm and dry.     Capillary Refill: Capillary refill takes less than 2 seconds.  Neurological:     General: No focal deficit present.     Mental Status: She is alert and oriented to person, place, and time.     Cranial Nerves: No cranial nerve deficit.     Sensory: No sensory deficit.     Deep Tendon Reflexes: Reflexes normal.           Assessment & Plan:

## 2020-07-28 NOTE — Assessment & Plan Note (Addendum)
Immunizations UTD, tetanus updated today, decline flu Pap updated today Dicussed importance of healthy diet and exercise Exam today unremarkable.  Follow up in 1 year.   Agree with assessment and plan. Doreene Nest, NP

## 2020-07-28 NOTE — Assessment & Plan Note (Signed)
Infrequent breakouts, refill for Valtrex provided today.

## 2020-07-28 NOTE — Progress Notes (Signed)
Subjective:    Patient ID: Nichole Mcbride, female    DOB: [birth year], 34 y.o.   MRN: [id number]  HPI  This visit occurred during the SARS-CoV-2 public health emergency.  Safety protocols were in place, including screening questions prior to the visit, additional usage of staff PPE, and extensive cleaning of exam room while observing appropriate contact time as indicated for disinfecting solutions.   Nichole Mcbride is a 34 year old female who presents today for complete physical.  Immunizations: -Tetanus: Unknown -Influenza: Declines  -Covid-19: Completed series  Diet: She endorses a healthy diet. Exercise: She exercises regularly   Eye exam: No recent exam Dental exam: No recent exam  Pap Smear: Completed in 2018, due today  BP Readings from Last 3 Encounters:  07/28/20 108/68  04/29/20 120/82  08/07/19 100/80     Review of Systems  Constitutional: Negative for unexpected weight change.  HENT: Negative for rhinorrhea.   Eyes: Negative for visual disturbance.  Respiratory: Negative for cough and shortness of breath.   Cardiovascular: Negative for chest pain.  Gastrointestinal: Negative for constipation and diarrhea.  Genitourinary: Negative for difficulty urinating and menstrual problem.  Musculoskeletal: Negative for arthralgias and myalgias.  Skin: Negative for rash.  Allergic/Immunologic: Negative for environmental allergies.  Neurological: Positive for headaches. Negative for dizziness and numbness.  Psychiatric/Behavioral: The patient is nervous/anxious.        Past Medical History:  Diagnosis Date   Carpal tunnel syndrome    bilaterally   DEPRESSION 06/10/2008   INSOMNIA 06/10/2008   Migraine headache    Raynaud phenomenon    SCOLIOSIS 06/10/2008   Sjogren's disease (HCC)      Social History   Socioeconomic History   Marital status: Married    Spouse name: Not on file   Number of children: Not on file   Years of education: Not on file     Highest education level: Not on file  Occupational History   Not on file  Tobacco Use   Smoking status: Former Smoker    Types: Cigarettes   Smokeless tobacco: Never Used  Building services engineer Use: Former  Substance and Sexual Activity   Alcohol use: Yes   Drug use: No   Sexual activity: Not Currently    Birth control/protection: None  Other Topics Concern   Not on file  Social History Narrative   Married.   2 children.   Teaches English.   Enjoys spending time with family.   Social Determinants of Health   Financial Resource Strain:    Difficulty of Paying Living Expenses: Not on file  Food Insecurity:    Worried About Programme researcher, broadcasting/film/video in the Last Year: Not on file   [employer] of Food in the Last Year: Not on file  Transportation Needs:    Lack of Transportation (Medical): Not on file   Lack of Transportation (Non-Medical): Not on file  Physical Activity:    Days of Exercise per Week: Not on file   Minutes of Exercise per Session: Not on file  Stress:    Feeling of Stress : Not on file  Social Connections:    Frequency of Communication with Friends and Family: Not on file   Frequency of Social Gatherings with Friends and Family: Not on file   Attends Religious Services: Not on file   Active Member of Clubs or Organizations: Not on file   Attends Banker Meetings: Not on file   Marital Status:  Not on file  Intimate Partner Violence:    Fear of Current or Ex-Partner: Not on file   Emotionally Abused: Not on file   Physically Abused: Not on file   Sexually Abused: Not on file    Past Surgical History:  Procedure Laterality Date   INTRAUTERINE DEVICE INSERTION  11/05/2009    Family History  Problem Relation Age of Onset   Diabetes Mother    Hypertension Mother    Hypothyroidism Mother    Depression Mother    Hypertension Father    Hypothyroidism Father    Depression Brother    Bipolar disorder Cousin      Allergies  Allergen Reactions   Ciprofloxacin Hcl Other (See Comments)    unknown   Imitrex [Sumatriptan Succinate] Diarrhea and Nausea And Vomiting    severe   Sumatriptan Other (See Comments)    "tightness in my chest and dizziness"    Current Outpatient Medications on File Prior to Visit  Medication Sig Dispense Refill   valACYclovir (VALTREX) 500 MG tablet Take 1 tablet by mouth twice daily for three days as needed for outbreaks. 6 tablet 1   predniSONE (DELTASONE) 20 MG tablet Take 2 tablets daily for five days. (Patient not taking: Reported on 07/28/2020) 10 tablet 0   No current facility-administered medications on file prior to visit.    BP 108/68 (BP Location: Left Arm, Patient Position: Sitting)    Pulse 65    Temp 98.3 F (36.8 C)    Ht 5' 4.96" (1.65 m)    Wt 168 lb 6.4 oz (76.4 kg)    SpO2 97%    BMI 28.06 kg/m    Objective:   Physical Exam HENT:     Right Ear: Tympanic membrane and ear canal normal.     Left Ear: Tympanic membrane and ear canal normal.  Eyes:     Pupils: Pupils are equal, round, and reactive to light.  Cardiovascular:     Rate and Rhythm: Normal rate and regular rhythm.  Pulmonary:     Effort: Pulmonary effort is normal.     Breath sounds: Normal breath sounds.  Abdominal:     General: Bowel sounds are normal.     Palpations: Abdomen is soft.     Tenderness: There is no abdominal tenderness.  Genitourinary:    Labia:        Right: No lesion.        Left: No lesion.      Cervix: Normal.     Uterus: Normal.      Adnexa: Right adnexa normal and left adnexa normal.  Musculoskeletal:        General: Normal range of motion.     Cervical back: Neck supple.  Skin:    General: Skin is warm and dry.  Neurological:     Mental Status: She is alert and oriented to person, place, and time.     Cranial Nerves: No cranial nerve deficit.     Deep Tendon Reflexes:     Reflex Scores:      Patellar reflexes are 2+ on the right side and 2+  on the left side. Psychiatric:        Mood and Affect: Mood normal.            Assessment & Plan:

## 2020-07-28 NOTE — Assessment & Plan Note (Signed)
Well controlled thru martial arts and coping mechanisms. She is currently not treated for his and reports overall she is doing well.  Agree with assessment and plan. Doreene Nest, NP

## 2020-07-28 NOTE — Assessment & Plan Note (Addendum)
Stable from previous year A1c 6.0 Will continue to monitor.  She will continue to improve diet and continue exercise plan.   Agree with assessment and plan. Doreene Nest, NP

## 2020-07-28 NOTE — Patient Instructions (Addendum)
Stop by the lab prior to leaving today. I will notify you of your results once received.   Please begin taking propranolol (Inderal) 80mg  once daily for migraine prevention. Please update me in 1 month to let me know how you are doing.   It was a pleasure to see you today!    Migraine Headache A migraine headache is a very strong throbbing pain on one side or both sides of your head. This type of headache can also cause other symptoms. It can last from 4 hours to 3 days. Talk with your doctor about what things may bring on (trigger) this condition. What are the causes? The exact cause of this condition is not known. This condition may be triggered or caused by:  Drinking alcohol.  Smoking.  Taking medicines, such as: ? Medicine used to treat chest pain (nitroglycerin). ? Birth control pills. ? Estrogen. ? Some blood pressure medicines.  Eating or drinking certain products.  Doing physical activity. Other things that may trigger a migraine headache include:  Having a menstrual period.  Pregnancy.  Hunger.  Stress.  Not getting enough sleep or getting too much sleep.  Weather changes.  Tiredness (fatigue). What increases the risk?  Being 79-19 years old.  Being female.  Having a family history of migraine headaches.  Being Caucasian.  Having depression or anxiety.  Being very overweight. What are the signs or symptoms?  A throbbing pain. This pain may: ? Happen in any area of the head, such as on one side or both sides. ? Make it hard to do daily activities. ? Get worse with physical activity. ? Get worse around bright lights or loud noises.  Other symptoms may include: ? Feeling sick to your stomach (nauseous). ? Vomiting. ? Dizziness. ? Being sensitive to bright lights, loud noises, or smells.  Before you get a migraine headache, you may get warning signs (an aura). An aura may include: ? Seeing flashing lights or having blind spots. ? Seeing  bright spots, halos, or zigzag lines. ? Having tunnel vision or blurred vision. ? Having numbness or a tingling feeling. ? Having trouble talking. ? Having weak muscles.  Some people have symptoms after a migraine headache (postdromal phase), such as: ? Tiredness. ? Trouble thinking (concentrating). How is this treated?  Taking medicines that: ? Relieve pain. ? Relieve the feeling of being sick to your stomach. ? Prevent migraine headaches.  Treatment may also include: ? Having acupuncture. ? Avoiding foods that bring on migraine headaches. ? Learning ways to control your body functions (biofeedback). ? Therapy to help you know and deal with negative thoughts (cognitive behavioral therapy). Follow these instructions at home: Medicines  Take over-the-counter and prescription medicines only as told by your doctor.  Ask your doctor if the medicine prescribed to you: ? Requires you to avoid driving or using heavy machinery. ? Can cause trouble pooping (constipation). You may need to take these steps to prevent or treat trouble pooping:  Drink enough fluid to keep your pee (urine) pale yellow.  Take over-the-counter or prescription medicines.  Eat foods that are high in fiber. These include beans, whole grains, and fresh fruits and vegetables.  Limit foods that are high in fat and sugar. These include fried or sweet foods. Lifestyle  Do not drink alcohol.  Do not use any products that contain nicotine or tobacco, such as cigarettes, e-cigarettes, and chewing tobacco. If you need help quitting, ask your doctor.  Get at least 8 hours  of sleep every night.  Limit and deal with stress. General instructions      Keep a journal to find out what may bring on your migraine headaches. For example, write down: ? What you eat and drink. ? How much sleep you get. ? Any change in what you eat or drink. ? Any change in your medicines.  If you have a migraine headache: ? Avoid  things that make your symptoms worse, such as bright lights. ? It may help to lie down in a dark, quiet room. ? Do not drive or use heavy machinery. ? Ask your doctor what activities are safe for you.  Keep all follow-up visits as told by your doctor. This is important. Contact a doctor if:  You get a migraine headache that is different or worse than others you have had.  You have more than 15 headache days in one month. Get help right away if:  Your migraine headache gets very bad.  Your migraine headache lasts longer than 72 hours.  You have a fever.  You have a stiff neck.  You have trouble seeing.  Your muscles feel weak or like you cannot control them.  You start to lose your balance a lot.  You start to have trouble walking.  You pass out (faint).  You have a seizure. Summary  A migraine headache is a very strong throbbing pain on one side or both sides of your head. These headaches can also cause other symptoms.  This condition may be treated with medicines and changes to your lifestyle.  Keep a journal to find out what may bring on your migraine headaches.  Contact a doctor if you get a migraine headache that is different or worse than others you have had.  Contact your doctor if you have more than 15 headache days in a month. This information is not intended to replace advice given to you by your health care provider. Make sure you discuss any questions you have with your health care provider. Document Revised: 01/03/2019 Document Reviewed: 10/24/2018 Elsevier Patient Education  2020 [employer]. Health Maintenance, Female Adopting a healthy lifestyle and getting preventive care are important in promoting health and wellness. Ask your health care provider about:  The right schedule for you to have regular tests and exams.  Things you can do on your own to prevent diseases and keep yourself healthy. What should I know about diet, weight, and  exercise? Eat a healthy diet   Eat a diet that includes plenty of vegetables, fruits, low-fat dairy products, and lean protein.  Do not eat a lot of foods that are high in solid fats, added sugars, or sodium. Maintain a healthy weight Body mass index (BMI) is used to identify weight problems. It estimates body fat based on height and weight. Your health care provider can help determine your BMI and help you achieve or maintain a healthy weight. Get regular exercise Get regular exercise. This is one of the most important things you can do for your health. Most adults should:  Exercise for at least 150 minutes each week. The exercise should increase your heart rate and make you sweat (moderate-intensity exercise).  Do strengthening exercises at least twice a week. This is in addition to the moderate-intensity exercise.  Spend less time sitting. Even light physical activity can be beneficial. Watch cholesterol and blood lipids Have your blood tested for lipids and cholesterol at 32 years of age, then have this test every 5 years.  Have your cholesterol levels checked more often if:  Your lipid or cholesterol levels are high.  You are older than 32 years of age.  You are at high risk for heart disease. What should I know about cancer screening? Depending on your health history and family history, you may need to have cancer screening at various ages. This may include screening for:  Breast cancer.  Cervical cancer.  Colorectal cancer.  Skin cancer.  Lung cancer. What should I know about heart disease, diabetes, and high blood pressure? Blood pressure and heart disease  High blood pressure causes heart disease and increases the risk of stroke. This is more likely to develop in people who have high blood pressure readings, are of African descent, or are overweight.  Have your blood pressure checked: ? Every 3-5 years if you are 1418-32 years of age. ? Every year if you are 32  years old or older. Diabetes Have regular diabetes screenings. This checks your fasting blood sugar level. Have the screening done:  Once every three years after age 32 if you are at a normal weight and have a low risk for diabetes.  More often and at a younger age if you are overweight or have a high risk for diabetes. What should I know about preventing infection? Hepatitis B If you have a higher risk for hepatitis B, you should be screened for this virus. Talk with your health care provider to find out if you are at risk for hepatitis B infection. Hepatitis C Testing is recommended for:  Everyone born from [birth year] through 1963.  Anyone with known risk factors for hepatitis C. Sexually transmitted infections (STIs)  Get screened for STIs, including gonorrhea and chlamydia, if: ? You are sexually active and are younger than 32 years of age. ? You are older than 32 years of age and your health care provider tells you that you are at risk for this type of infection. ? Your sexual activity has changed since you were last screened, and you are at increased risk for chlamydia or gonorrhea. Ask your health care provider if you are at risk.  Ask your health care provider about whether you are at high risk for HIV. Your health care provider may recommend a prescription medicine to help prevent HIV infection. If you choose to take medicine to prevent HIV, you should first get tested for HIV. You should then be tested every 3 months for as long as you are taking the medicine. Pregnancy  If you are about to stop having your period (premenopausal) and you may become pregnant, seek counseling before you get pregnant.  Take 400 to 800 micrograms (mcg) of folic acid every day if you become pregnant.  Ask for birth control (contraception) if you want to prevent pregnancy. Osteoporosis and menopause Osteoporosis is a disease in which the bones lose minerals and strength with aging. This can result in  bone fractures. If you are 32 years old or older, or if you are at risk for osteoporosis and fractures, ask your health care provider if you should:  Be screened for bone loss.  Take a calcium or vitamin D supplement to lower your risk of fractures.  Be given hormone replacement therapy (HRT) to treat symptoms of menopause. Follow these instructions at home: Lifestyle  Do not use any products that contain nicotine or tobacco, such as cigarettes, e-cigarettes, and chewing tobacco. If you need help quitting, ask your health care provider.  Do not use street drugs.  Do not share needles.  Ask your health care provider for help if you need support or information about quitting drugs. Alcohol use  Do not drink alcohol if: ? Your health care provider tells you not to drink. ? You are pregnant, may be pregnant, or are planning to become pregnant.  If you drink alcohol: ? Limit how much you use to 0-1 drink a day. ? Limit intake if you are breastfeeding.  Be aware of how much alcohol is in your drink. In the [place] one drink equals one 12 oz bottle of beer (355 mL), one 5 oz glass of wine (148 mL), or one 1 oz glass of hard liquor (44 mL). General instructions  Schedule regular health, dental, and eye exams.  Stay current with your vaccines.  Tell your health care provider if: ? You often feel depressed. ? You have ever been abused or do not feel safe at home. Summary  Adopting a healthy lifestyle and getting preventive care are important in promoting health and wellness.  Follow your health care provider's instructions about healthy diet, exercising, and getting tested or screened for diseases.  Follow your health care provider's instructions on monitoring your cholesterol and blood pressure. This information is not intended to replace advice given to you by your health care provider. Make sure you discuss any questions you have with your health care provider. Document Revised:  09/04/2018 Document Reviewed: 09/04/2018 Elsevier Patient Education  2020 [employer].

## 2020-07-29 LAB — CYTOLOGY - PAP
Chlamydia: NEGATIVE
Comment: NEGATIVE
Comment: NEGATIVE
Comment: NEGATIVE
Comment: NORMAL
Diagnosis: NEGATIVE
High risk HPV: NEGATIVE
Neisseria Gonorrhea: NEGATIVE
Trichomonas: NEGATIVE

## 2020-08-02 LAB — HSV 1/2 AB (IGM), IFA W/RFLX TITER
HSV 1 IgM Screen: NEGATIVE
HSV 2 IgM Screen: NEGATIVE

## 2020-08-02 LAB — HSV(HERPES SIMPLEX VRS) I + II AB-IGG
HAV 1 IGG,TYPE SPECIFIC AB: 35.4 index — ABNORMAL HIGH
HSV 2 IGG,TYPE SPECIFIC AB: <0.9 index

## 2020-08-02 LAB — HIV ANTIBODY (ROUTINE TESTING W REFLEX): HIV 1&2 Ab, 4th Generation: NONREACTIVE

## 2020-08-02 LAB — RPR: RPR Ser Ql: NONREACTIVE

## 2020-08-25 DIAGNOSIS — Z419 Encounter for procedure for purposes other than remedying health state, unspecified: Secondary | ICD-10-CM | POA: Diagnosis not present

## 2020-08-28 ENCOUNTER — Other Ambulatory Visit: Payer: Self-pay | Admitting: Primary Care

## 2020-08-28 DIAGNOSIS — G43901 Migraine, unspecified, not intractable, with status migrainosus: Secondary | ICD-10-CM

## 2020-09-25 DIAGNOSIS — Z419 Encounter for procedure for purposes other than remedying health state, unspecified: Secondary | ICD-10-CM | POA: Diagnosis not present

## 2020-10-26 DIAGNOSIS — Z419 Encounter for procedure for purposes other than remedying health state, unspecified: Secondary | ICD-10-CM | POA: Diagnosis not present

## 2020-11-23 DIAGNOSIS — Z419 Encounter for procedure for purposes other than remedying health state, unspecified: Secondary | ICD-10-CM | POA: Diagnosis not present

## 2020-12-06 DIAGNOSIS — M67911 Unspecified disorder of synovium and tendon, right shoulder: Secondary | ICD-10-CM | POA: Diagnosis not present

## 2020-12-21 ENCOUNTER — Telehealth: Payer: Self-pay

## 2020-12-21 DIAGNOSIS — N390 Urinary tract infection, site not specified: Secondary | ICD-10-CM | POA: Diagnosis not present

## 2020-12-21 DIAGNOSIS — R9431 Abnormal electrocardiogram [ECG] [EKG]: Secondary | ICD-10-CM | POA: Diagnosis not present

## 2020-12-21 DIAGNOSIS — R002 Palpitations: Secondary | ICD-10-CM | POA: Diagnosis not present

## 2020-12-21 DIAGNOSIS — R300 Dysuria: Secondary | ICD-10-CM | POA: Diagnosis not present

## 2020-12-21 DIAGNOSIS — R0602 Shortness of breath: Secondary | ICD-10-CM | POA: Diagnosis not present

## 2020-12-21 DIAGNOSIS — R079 Chest pain, unspecified: Secondary | ICD-10-CM | POA: Diagnosis not present

## 2020-12-21 DIAGNOSIS — M545 Low back pain, unspecified: Secondary | ICD-10-CM | POA: Diagnosis not present

## 2020-12-21 NOTE — Telephone Encounter (Signed)
Noted  

## 2020-12-21 NOTE — Telephone Encounter (Signed)
[facility] - Client TELEPHONE ADVICE RECORD [facility] Patient Name: Nichole Mcbride Gender: Female DOB: [birth year] Age: 35 Y 11 M 26 D Return Phone Number: [phone] (Primary) Address: City/State/Zip: [place] [address] Client [facility] Main Day - Client Client Site [facility] - Day Physician Vernona Rieger - NP Contact Type Call Who Is Calling Patient / Member / Family / Caregiver Call Type Triage / Clinical Relationship To Patient Self Return Phone Number [phone] (Primary) Chief Complaint BREATHING - shortness of breath or sounds breathless Reason for Call Symptomatic / Request for Health Information Initial Comment Caller has shortness of breath, fever, and chills. Caller also has UTI symptoms GOTO Facility Not Listed [place] regional Translation No Nurse Assessment Nurse: Alexander Mt, RN, Nicholaus Bloom Date/Time (Eastern Time): 12/21/2020 10:40:37 AM Confirm and document reason for call. If symptomatic, describe symptoms. ---Caller has shortness of breath that started yesterday, fever, and chills. Caller also has UTI symptoms. Temp is 100.1 this morning. caller states she is also having burning and urinary frequency. Abdominal cramping in her upper abdomen. Does the patient have any new or worsening symptoms? ---Yes Will a triage be completed? ---Yes Related visit to physician within the last 2 weeks? ---No Does the PT have any chronic conditions? (i.e. diabetes, asthma, this includes High risk factors for pregnancy, etc.) ---No Is the patient pregnant or possibly pregnant? (Ask all females between the ages of 65-55) ---No Is this a behavioral health or substance abuse call? ---No Guidelines Guideline Title Affirmed Question Affirmed Notes Nurse Date/Time (Eastern Time) Urination Pain - Female > 2 UTI's in last year Alexander Mt, Pauline Aus 12/21/2020 10:42:48 AM Breathing Difficulty Extra heart beats  OR irregular heart beating (i.e., "palpitations") Graylin Shiver 12/21/2020 10:45:43 AM Disp. Time Lamount Cohen Time) Disposition Final User 12/21/2020 10:39:35 AM Send to Urgent Queue Leotis Shames 12/21/2020 10:45:21 AM See PCP within 24 Hours [place], RN, Nicholaus Bloom PLEASE NOTE: All timestamps contained within this report are represented as [place] Standard Time. CONFIDENTIALTY NOTICE: This fax transmission is intended only for the addressee. It contains information that is legally privileged, confidential or otherwise protected from use or disclosure. If you are not the intended recipient, you are strictly prohibited from reviewing, disclosing, copying using or disseminating any of this information or taking any action in reliance on or regarding this information. If you have received this fax in error, please notify us immediately by telephone so that we can arrange for its return to [place]. Phone: [phone], Toll-Free: [phone], Fax: [phone] Page: 2 of 2 Call Id: [id number] 12/21/2020 10:48:08 AM Go to ED Now Yes Alexander Mt, RN, Prentiss Bells Disagree/Comply Comply Caller Understands Yes PreDisposition Did not know what to do Care Advice Given Per Guideline SEE PCP WITHIN 24 HOURS: * IF OFFICE WILL BE OPEN: You need to be examined within the next 24 hours. Call your doctor (or NP/PA) when the office opens and make an appointment. REASSURANCE AND EDUCATION: * This could be an urinary tract infection. * You should see your PCP to be examined and tested. DRINK EXTRA FLUIDS: * Drink extra fluids. * Drink 8 to 10 cups (1,800 to 2,400 ml) of liquids a day. * Reason: This will water-down your urine and make it less painful to pass. If there is an infection, this will help wash out the germs from your bladder. DRINK EXTRA FLUIDS - EXTRA NOTES AND WARNINGS: * Increased fluid intake may be contraindicated in adults with renal failure or heart failure. *  Discuss with your doctor (or NP/PA). * IBUPROFEN  (E.G., MOTRIN, ADVIL): Take 400 mg (two 200 mg pills) by mouth every 6 hours. The most you should take each day is 1,200 mg (six 200 mg pills), unless your doctor has told you to take more. * ACETAMINOPHEN - REGULAR STRENGTH TYLENOL: Take 650 mg (two 325 mg pills) by mouth every 4 to 6 hours as needed. Each Regular Strength Tylenol pill has 325 mg of acetaminophen. The most you should take each day is 3,250 mg (10 pills a day). PAIN MEDICINES: * For pain relief, you can take either acetaminophen, ibuprofen, or naproxen. CALL BACK IF: * Fever or back pain occurs * You become worse CARE ADVICE given per Urination Pain - Female (Adult) guideline. GO TO ED NOW: * You need to be seen in the Emergency Department. * Go to the ED at ___________ Hospital. * Leave now. Drive carefully. CALL EMS 911 IF: * Call EMS if you become worse. CARE ADVICE given per Breathing Difficulty (Adult) guideline. Referrals GO TO FACILITY OTHER - SPECIFY

## 2020-12-21 NOTE — Telephone Encounter (Signed)
Spoke with patient who stated that she is on the phone with her insurance company to see which ED she could go to. Patient asked if that was her only option. Informed patient that since she is having SOB she should be seen at least at an UC. Patient stated that she plans to either go to [facility] or ED.

## 2020-12-24 DIAGNOSIS — Z419 Encounter for procedure for purposes other than remedying health state, unspecified: Secondary | ICD-10-CM | POA: Diagnosis not present

## 2021-01-23 DIAGNOSIS — Z419 Encounter for procedure for purposes other than remedying health state, unspecified: Secondary | ICD-10-CM | POA: Diagnosis not present

## 2021-01-25 ENCOUNTER — Encounter: Payer: Self-pay | Admitting: Family Medicine

## 2021-01-25 ENCOUNTER — Ambulatory Visit (INDEPENDENT_AMBULATORY_CARE_PROVIDER_SITE_OTHER): Payer: Medicaid Other | Admitting: Family Medicine

## 2021-01-25 ENCOUNTER — Other Ambulatory Visit: Payer: Self-pay

## 2021-01-25 VITALS — BP 110/80 | HR 91 | Temp 98.5°F | Wt 173.0 lb

## 2021-01-25 DIAGNOSIS — R52 Pain, unspecified: Secondary | ICD-10-CM | POA: Insufficient documentation

## 2021-01-25 DIAGNOSIS — S0993XA Unspecified injury of face, initial encounter: Secondary | ICD-10-CM | POA: Diagnosis not present

## 2021-01-25 DIAGNOSIS — S0990XA Unspecified injury of head, initial encounter: Secondary | ICD-10-CM

## 2021-01-25 HISTORY — DX: Unspecified injury of face, initial encounter: S09.93XA

## 2021-01-25 LAB — COMPREHENSIVE METABOLIC PANEL
ALT: 11 U/L (ref 0–35)
AST: 16 U/L (ref 0–37)
Albumin: 4.3 g/dL (ref 3.5–5.2)
Alkaline Phosphatase: 53 U/L (ref 39–117)
BUN: 12 mg/dL (ref 6–23)
CO2: 28 mEq/L (ref 19–32)
Calcium: 9.1 mg/dL (ref 8.4–10.5)
Chloride: 104 mEq/L (ref 96–112)
Creatinine, Ser: 0.86 mg/dL (ref 0.40–1.20)
GFR: 88.97 mL/min (ref >60.00)
Glucose, Bld: 93 mg/dL (ref 70–99)
Potassium: 4.4 mEq/L (ref 3.5–5.1)
Sodium: 139 mEq/L (ref 135–145)
Total Bilirubin: 0.8 mg/dL (ref 0.2–1.2)
Total Protein: 6.6 g/dL (ref 6.0–8.3)

## 2021-01-25 LAB — CBC
HCT: 40.2 % (ref 36.0–46.0)
Hemoglobin: 13.5 g/dL (ref 12.0–15.0)
MCHC: 33.7 g/dL (ref 30.0–36.0)
MCV: 87.6 fl (ref 78.0–100.0)
Platelets: 280 10*3/uL (ref 150.0–400.0)
RBC: 4.58 Mil/uL (ref 3.87–5.11)
RDW: 13 % (ref 11.5–15.5)
WBC: 4.8 10*3/uL (ref 4.0–10.5)

## 2021-01-25 LAB — TSH: TSH: 2.05 u[IU]/mL (ref 0.35–4.50)
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
## 2021-01-25 NOTE — Assessment & Plan Note (Signed)
5 days of symptoms w/o localizing features. Pt with hx of fibromylagia but notes symptoms different. Will get CBC to evaluate for signs of infection and check thyroid as no recent testing. Discussed monitoring and if persisting w/o clear cause would f/u with pcp to consider autoimmune testing.

## 2021-01-25 NOTE — Patient Instructions (Signed)
Increase ibuprofen take 600-800 mg three times a day if tolerating   Can also try voltaren gel (topical)   Eat soft foods and avoid sandwiches/burgers

## 2021-01-27 ENCOUNTER — Telehealth: Payer: Self-pay

## 2021-01-27 NOTE — Telephone Encounter (Signed)
Dr Selena Batten, received notification from insurance stating this : "This case cannot be approved based on clinical information received. Please upload additional clinical documents if available. If your physician would like a peer to peer consult, call [phone]."  I already faxed over all the notes I had from her chart originally. Please call insurance for Peer to peer  Tracking # for the case is [id number] Patient may be in their system under her old last name, which is Sybert, but per patient she changed it in their system, but in case. Member ID [id number] Location for [facility] CPT codes: 80223 and 36122  Let me know when you can the outcome so I can let patient know. Thank you!

## 2021-01-27 NOTE — Telephone Encounter (Signed)
Called for peer to peer for imaging  Discussed symptoms  Will get approval for face CT  Authorization # unable to obtain due to systems issue on their end. They will fax today.   Denial upheld for head CT - at this time lower concern so will move forward with face CT and follow-up head imaging if needed  Routing to referral coordinators to be on the lookout

## 2021-01-27 NOTE — Addendum Note (Signed)
Addended by: Consuella Lose on: 01/27/2021 02:42 PM   Modules accepted: Orders

## 2021-01-27 NOTE — Telephone Encounter (Signed)
Auth # for CT Maxillofacial [id number], Dates valid: 01/27/21-03/27/21. Patient advised and scheduled for 01/31/21 at 9 am at [facility] [place] location. See order notes.

## 2021-01-31 ENCOUNTER — Other Ambulatory Visit: Payer: Self-pay

## 2021-01-31 ENCOUNTER — Ambulatory Visit (HOSPITAL_BASED_OUTPATIENT_CLINIC_OR_DEPARTMENT_OTHER)
Admission: RE | Admit: 2021-01-31 | Discharge: 2021-01-31 | Disposition: A | Discharge status: Home / Self Care | Payer: Medicaid Other | Source: Ambulatory Visit | Attending: Family Medicine | Admitting: Family Medicine

## 2021-01-31 DIAGNOSIS — S0993XA Unspecified injury of face, initial encounter: Secondary | ICD-10-CM | POA: Diagnosis not present

## 2021-01-31 DIAGNOSIS — S0990XA Unspecified injury of head, initial encounter: Secondary | ICD-10-CM | POA: Diagnosis not present

## 2021-02-15 ENCOUNTER — Ambulatory Visit
Admission: RE | Admit: 2021-02-15 | Discharge: 2021-02-15 | Disposition: A | Discharge status: Home / Self Care | Payer: Medicaid Other | Source: Ambulatory Visit | Attending: Primary Care | Admitting: Primary Care

## 2021-02-15 ENCOUNTER — Ambulatory Visit (INDEPENDENT_AMBULATORY_CARE_PROVIDER_SITE_OTHER)
Admission: RE | Admit: 2021-02-15 | Discharge: 2021-02-15 | Disposition: A | Discharge status: Home / Self Care | Payer: Medicaid Other | Source: Ambulatory Visit | Attending: Primary Care | Admitting: Primary Care

## 2021-02-15 ENCOUNTER — Ambulatory Visit (INDEPENDENT_AMBULATORY_CARE_PROVIDER_SITE_OTHER): Payer: Medicaid Other | Admitting: Primary Care

## 2021-02-15 ENCOUNTER — Other Ambulatory Visit: Payer: Self-pay

## 2021-02-15 DIAGNOSIS — M25542 Pain in joints of left hand: Secondary | ICD-10-CM

## 2021-02-15 DIAGNOSIS — M25541 Pain in joints of right hand: Secondary | ICD-10-CM | POA: Diagnosis not present

## 2021-02-15 DIAGNOSIS — R52 Pain, unspecified: Secondary | ICD-10-CM | POA: Diagnosis not present

## 2021-02-15 DIAGNOSIS — G43909 Migraine, unspecified, not intractable, without status migrainosus: Secondary | ICD-10-CM

## 2021-02-15 DIAGNOSIS — M79642 Pain in left hand: Secondary | ICD-10-CM | POA: Diagnosis not present

## 2021-02-15 MED ORDER — RIZATRIPTAN BENZOATE 5 MG PO TABS: ORAL_TABLET | ORAL | 0 refills | Status: DC

## 2021-02-15 NOTE — Patient Instructions (Signed)
Stop by the lab and xray prior to leaving today. I will notify you of your results once received.   You may take the rizatriptan (Maxalt) at migraine onset. May repeat 2 hours later if migraine persists.   It was a pleasure to see you today!

## 2021-02-15 NOTE — Assessment & Plan Note (Signed)
Present for the last three days, has no abortive therapy at home.   Agree to provided Maxalt 5 mg as she's historically done well on this in the past. Continue propranolol ER 80 mg daily.

## 2021-02-15 NOTE — Assessment & Plan Note (Signed)
Acute for the last month without obvious cause.  She does practice jujitsu daily and has done so for the last year, this does require a long of handling.   Checking labs today including RF, CCP, Sed rate, uric acid, CRP.   Checking xray of both hands.

## 2021-02-15 NOTE — Assessment & Plan Note (Signed)
Continued but more so to the hands. Labs reviewed from last visit.   See notes under hand pain.

## 2021-02-15 NOTE — Progress Notes (Signed)
Subjective:    Patient ID: Nichole Mcbride, female    DOB: 12-May-1986, 35 y.o.   MRN: [id number]  HPI  Nichole Mcbride is a very pleasant 35 y.o. female with a history of migraines, fibromyalgia, anxiety and depression, Sjogren's disease, prediabetes,who presents today for follow-up of body aches.  She was last evaluated on 01/25/2021 by Dr. Selena Batten, endorsed a recent head injury to the right side of her head with pain to the right temporal and jaw or region.  She also endorsed generalized body aches that began 01/21/2021 with difficulty sleeping, worse in the hands.  Lab work-up during this visit included CMP, CBC and TSH, all of which were unremarkable.  She underwent CT maxillofacial which did not show any acute findings.  She was encouraged to follow-up with PCP for further evaluation if symptoms persisted.  Since her last visit she continues to experience joint aches to her hands, wrists, cervical spine. Symptoms began in late April 2022. She has a history of "fibromylalgia" but these symptoms feel different. It originally felt like she was about to get a fever but never did. She denies changes in medications, medications, exercise. She also denies joint swelling.   She does practice jujitsu and has been doing so over the last year, practices everyday. She's undergone carpal tunnel testing around 2017 or 2018 which was positive. She does have occasional carpal tunnel.    She's also noticed a migraine for the last three days for which she's attributed to the rainy and hot weather changes. She's compliant to her propranolol ER 80 mg, does not have migraine abortive treatment. She cannot tolerate Imitrex but has been able to take Maxalt.   Review of Systems  Eyes: Positive for photophobia.  Musculoskeletal: Positive for arthralgias. Negative for joint swelling.       See HPI  Neurological: Positive for headaches.         Past Medical History:  Diagnosis Date  . Carpal tunnel  syndrome    bilaterally  . DEPRESSION 06/10/2008  . INSOMNIA 06/10/2008  . Migraine headache   . Raynaud phenomenon   . SCOLIOSIS 06/10/2008  . Sjogren's disease (HCC)     Social History   Socioeconomic History  . Marital status: Married    Spouse name: Not on file  . Number of children: Not on file  . Years of education: Not on file  . Highest education level: Not on file  Occupational History  . Not on file  Tobacco Use  . Smoking status: Former Smoker    Types: Cigarettes  . Smokeless tobacco: Never Used  Vaping Use  . Vaping Use: Former  Substance and Sexual Activity  . Alcohol use: Yes  . Drug use: No  . Sexual activity: Not Currently    Birth control/protection: None  Other Topics Concern  . Not on file  Social History Narrative   Married.   2 children.   Teaches English.   Enjoys spending time with family.   Social Determinants of Health   Financial Resource Strain: Not on file  Food Insecurity: Not on file  Transportation Needs: Not on file  Physical Activity: Not on file  Stress: Not on file  Social Connections: Not on file  Intimate Partner Violence: Not on file    Past Surgical History:  Procedure Laterality Date  . INTRAUTERINE DEVICE INSERTION  11/05/2009    Family History  Problem Relation Age of Onset  . Diabetes Mother   . Hypertension Mother   .  Hypothyroidism Mother   . Depression Mother   . Hypertension Father   . Hypothyroidism Father   . Depression Brother   . Bipolar disorder Cousin     Allergies  Allergen Reactions  . Ciprofloxacin Hcl Other (See Comments)    unknown  . Imitrex [Sumatriptan Succinate] Diarrhea and Nausea And Vomiting    severe  . Sumatriptan Other (See Comments)    "tightness in my chest and dizziness"    Current Outpatient Medications on File Prior to Visit  Medication Sig Dispense Refill  . ibuprofen (ADVIL) 400 MG tablet Take 400 mg by mouth every 6 (six) hours as needed.    . propranolol ER  (INDERAL LA) 80 MG 24 hr capsule TAKE 1 CAPSULE BY MOUTH DAILY 90 capsule 3  . valACYclovir (VALTREX) 500 MG tablet Take 1 tablet by mouth twice daily for three days as needed for outbreaks. 6 tablet 1   No current facility-administered medications on file prior to visit.    BP 110/80   Pulse 61   Temp 98 F (36.7 C) (Temporal)   Ht 5\' 5"  (1.651 m)   Wt 179 lb 12 oz (81.5 kg)   LMP 02/12/2021 (Exact Date)   SpO2 100%   BMI 29.91 kg/m  Objective:   Physical Exam Cardiovascular:     Rate and Rhythm: Normal rate and regular rhythm.  Pulmonary:     Effort: Pulmonary effort is normal.     Breath sounds: Normal breath sounds.  Musculoskeletal:     Cervical back: Neck supple.     Comments: Pain located to the PCP and metacarpal joints bilaterally. No obvious swelling.  She does grip and pop her hands during our visit.   Skin:    General: Skin is warm and dry.     Findings: No erythema.           Assessment & Plan:      This visit occurred during the SARS-CoV-2 public health emergency.  Safety protocols were in place, including screening questions prior to the visit, additional usage of staff PPE, and extensive cleaning of exam room while observing appropriate contact time as indicated for disinfecting solutions.

## 2021-02-16 LAB — C-REACTIVE PROTEIN: CRP: <1 mg/dL (ref 0.5–20.0)

## 2021-02-16 LAB — URIC ACID: Uric Acid, Serum: 4.1 mg/dL (ref 2.4–7.0)

## 2021-02-16 LAB — SEDIMENTATION RATE: Sed Rate: 3 mm/hr (ref 0–20)

## 2021-02-17 LAB — CYCLIC CITRUL PEPTIDE ANTIBODY, IGG: Cyclic Citrullin Peptide Ab: <16 UNITS

## 2021-02-17 LAB — ANTI-NUCLEAR AB-TITER (ANA TITER)
ANA TITER: 1:640 {titer} — ABNORMAL HIGH
ANA Titer 1: 1:320 {titer} — ABNORMAL HIGH

## 2021-02-17 LAB — ANA: Anti Nuclear Antibody (ANA): POSITIVE — AB

## 2021-02-17 LAB — RHEUMATOID FACTOR: Rheumatoid fact SerPl-aCnc: <14 IU/mL (ref <14)

## 2021-02-18 DIAGNOSIS — R768 Other specified abnormal immunological findings in serum: Secondary | ICD-10-CM

## 2021-02-18 DIAGNOSIS — R52 Pain, unspecified: Secondary | ICD-10-CM

## 2021-02-18 DIAGNOSIS — M25541 Pain in joints of right hand: Secondary | ICD-10-CM

## 2021-02-22 ENCOUNTER — Telehealth: Payer: Self-pay | Admitting: Primary Care

## 2021-02-22 NOTE — Telephone Encounter (Signed)
It was just about the rheumatology appointment. We are communicating via My Chart.

## 2021-02-22 NOTE — Telephone Encounter (Signed)
Pt would like a call back as soon as possible

## 2021-02-22 NOTE — Telephone Encounter (Signed)
Called patient states that you seen her mother today and she was told to give you a call.

## 2021-02-23 DIAGNOSIS — Z419 Encounter for procedure for purposes other than remedying health state, unspecified: Secondary | ICD-10-CM | POA: Diagnosis not present

## 2021-03-16 ENCOUNTER — Telehealth: Payer: Self-pay

## 2021-03-16 NOTE — Telephone Encounter (Signed)
Called patient informed waiver completed. She would like copy left at reception to pick up. Will send copy to scan. No further action needed.

## 2021-03-25 DIAGNOSIS — Z419 Encounter for procedure for purposes other than remedying health state, unspecified: Secondary | ICD-10-CM | POA: Diagnosis not present
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
## 2021-04-14 DIAGNOSIS — B001 Herpesviral vesicular dermatitis: Secondary | ICD-10-CM

## 2021-04-15 MED ORDER — VALACYCLOVIR HCL 1 G PO TABS: ORAL_TABLET | ORAL | 0 refills | Status: DC

## 2021-04-20 ENCOUNTER — Ambulatory Visit: Payer: Self-pay

## 2021-04-20 ENCOUNTER — Other Ambulatory Visit: Payer: Self-pay

## 2021-04-20 ENCOUNTER — Ambulatory Visit: Payer: Medicaid Other | Admitting: Rheumatology

## 2021-04-20 ENCOUNTER — Encounter: Payer: Self-pay | Admitting: Rheumatology

## 2021-04-20 VITALS — BP 132/82 | HR 69 | Ht 65.0 in | Wt 170.4 lb

## 2021-04-20 DIAGNOSIS — M79642 Pain in left hand: Secondary | ICD-10-CM

## 2021-04-20 DIAGNOSIS — M797 Fibromyalgia: Secondary | ICD-10-CM

## 2021-04-20 DIAGNOSIS — I7300 Raynaud's syndrome without gangrene: Secondary | ICD-10-CM

## 2021-04-20 DIAGNOSIS — F419 Anxiety disorder, unspecified: Secondary | ICD-10-CM

## 2021-04-20 DIAGNOSIS — R7689 Other specified abnormal immunological findings in serum: Secondary | ICD-10-CM

## 2021-04-20 DIAGNOSIS — Z8669 Personal history of other diseases of the nervous system and sense organs: Secondary | ICD-10-CM

## 2021-04-20 DIAGNOSIS — F32A Depression, unspecified: Secondary | ICD-10-CM

## 2021-04-20 DIAGNOSIS — R7303 Prediabetes: Secondary | ICD-10-CM

## 2021-04-20 DIAGNOSIS — N946 Dysmenorrhea, unspecified: Secondary | ICD-10-CM | POA: Diagnosis not present

## 2021-04-20 DIAGNOSIS — R52 Pain, unspecified: Secondary | ICD-10-CM

## 2021-04-20 DIAGNOSIS — M3500 Sicca syndrome, unspecified: Secondary | ICD-10-CM

## 2021-04-20 DIAGNOSIS — M3509 Sicca syndrome with other organ involvement: Secondary | ICD-10-CM

## 2021-04-20 DIAGNOSIS — M542 Cervicalgia: Secondary | ICD-10-CM | POA: Diagnosis not present

## 2021-04-20 DIAGNOSIS — M79641 Pain in right hand: Secondary | ICD-10-CM

## 2021-04-20 DIAGNOSIS — Z8719 Personal history of other diseases of the digestive system: Secondary | ICD-10-CM

## 2021-04-20 DIAGNOSIS — M79671 Pain in right foot: Secondary | ICD-10-CM

## 2021-04-20 DIAGNOSIS — R768 Other specified abnormal immunological findings in serum: Secondary | ICD-10-CM

## 2021-04-20 DIAGNOSIS — M79672 Pain in left foot: Secondary | ICD-10-CM | POA: Diagnosis not present

## 2021-04-20 DIAGNOSIS — Z8659 Personal history of other mental and behavioral disorders: Secondary | ICD-10-CM

## 2021-04-20 DIAGNOSIS — B001 Herpesviral vesicular dermatitis: Secondary | ICD-10-CM

## 2021-04-20 DIAGNOSIS — F331 Major depressive disorder, recurrent, moderate: Secondary | ICD-10-CM

## 2021-04-20 NOTE — Patient Instructions (Signed)
Hand Exercises Hand exercises can be helpful for almost anyone. These exercises can strengthen the hands, improve flexibility and movement, and increase blood flow to the hands. These results can make work and daily tasks easier. Hand exercises can be especially helpful for people who have joint pain from arthritis or have nerve damage from overuse (carpal tunnel syndrome). These exercises can also help people who have injured a hand. Exercises Most of these hand exercises are gentle stretching and motion exercises. It is usually safe to do them often throughout the day. Warming up your hands before exercise may help to reduce stiffness. You can do this with gentle massage orby placing your hands in warm water for 10-15 minutes. It is normal to feel some stretching, pulling, tightness, or mild discomfort as you begin new exercises. This will gradually improve. Stop an exercise right away if you feel sudden, severe pain or your pain gets worse. Ask your healthcare provider which exercises are best for you. Knuckle bend or "claw" fist Stand or sit with your arm, hand, and all five fingers pointed straight up. Make sure to keep your wrist straight during the exercise. Gently bend your fingers down toward your palm until the tips of your fingers are touching the top of your palm. Keep your big knuckle straight and just bend the small knuckles in your fingers. Hold this position for __________ seconds. Straighten (extend) your fingers back to the starting position. Repeat this exercise 5-10 times with each hand. Full finger fist Stand or sit with your arm, hand, and all five fingers pointed straight up. Make sure to keep your wrist straight during the exercise. Gently bend your fingers into your palm until the tips of your fingers are touching the middle of your palm. Hold this position for __________ seconds. Extend your fingers back to the starting position, stretching every joint fully. Repeat this  exercise 5-10 times with each hand. Straight fist Stand or sit with your arm, hand, and all five fingers pointed straight up. Make sure to keep your wrist straight during the exercise. Gently bend your fingers at the big knuckle, where your fingers meet your hand, and the middle knuckle. Keep the knuckle at the tips of your fingers straight and try to touch the bottom of your palm. Hold this position for __________ seconds. Extend your fingers back to the starting position, stretching every joint fully. Repeat this exercise 5-10 times with each hand. Tabletop Stand or sit with your arm, hand, and all five fingers pointed straight up. Make sure to keep your wrist straight during the exercise. Gently bend your fingers at the big knuckle, where your fingers meet your hand, as far down as you can while keeping the small knuckles in your fingers straight. Think of forming a tabletop with your fingers. Hold this position for __________ seconds. Extend your fingers back to the starting position, stretching every joint fully. Repeat this exercise 5-10 times with each hand. Finger spread Place your hand flat on a table with your palm facing down. Make sure your wrist stays straight as you do this exercise. Spread your fingers and thumb apart from each other as far as you can until you feel a gentle stretch. Hold this position for __________ seconds. Bring your fingers and thumb tight together again. Hold this position for __________ seconds. Repeat this exercise 5-10 times with each hand. Making circles Stand or sit with your arm, hand, and all five fingers pointed straight up. Make sure to keep your   wrist straight during the exercise. Make a circle by touching the tip of your thumb to the tip of your index finger. Hold for __________ seconds. Then open your hand wide. Repeat this motion with your thumb and each finger on your hand. Repeat this exercise 5-10 times with each hand. Thumb motion Sit  with your forearm resting on a table and your wrist straight. Your thumb should be facing up toward the ceiling. Keep your fingers relaxed as you move your thumb. Lift your thumb up as high as you can toward the ceiling. Hold for __________ seconds. Bend your thumb across your palm as far as you can, reaching the tip of your thumb for the small finger (pinkie) side of your palm. Hold for __________ seconds. Repeat this exercise 5-10 times with each hand. Grip strengthening  Hold a stress ball or other soft ball in the middle of your hand. Slowly increase the pressure, squeezing the ball as much as you can without causing pain. Think of bringing the tips of your fingers into the middle of your palm. All of your finger joints should bend when doing this exercise. Hold your squeeze for __________ seconds, then relax. Repeat this exercise 5-10 times with each hand. Contact a health care provider if: Your hand pain or discomfort gets much worse when you do an exercise. Your hand pain or discomfort does not improve within 2 hours after you exercise. If you have any of these problems, stop doing these exercises right away. Do not do them again unless your health care provider says that you can. Get help right away if: You develop sudden, severe hand pain or swelling. If this happens, stop doing these exercises right away. Do not do them again unless your health care provider says that you can. This information is not intended to replace advice given to you by your health care provider. Make sure you discuss any questions you have with your healthcare provider. Document Revised: 01/02/2019 Document Reviewed: 09/12/2018 Elsevier Patient Education  2022 [employer] Cervical Strain and Sprain Rehab Ask your health care provider which exercises are safe for you. Do exercises exactly as told by your health care provider and adjust them as directed. It is normal to feel mild stretching, pulling, tightness,  or discomfort as you do these exercises. Stop right away if you feel sudden pain or your pain gets worse. Do not begin these exercises until told by your health care provider. Stretching and range-of-motion exercises Cervical side bending  Using good posture, sit on a stable chair or stand up. Without moving your shoulders, slowly tilt your left / right ear to your shoulder until you feel a stretch in the opposite side neck muscles. You should be looking straight ahead. Hold for __________ seconds. Repeat with the other side of your neck. Repeat __________ times. Complete this exercise __________ times a day. Cervical rotation  Using good posture, sit on a stable chair or stand up. Slowly turn your head to the side as if you are looking over your left / right shoulder. Keep your eyes level with the ground. Stop when you feel a stretch along the side and the back of your neck. Hold for __________ seconds. Repeat this by turning to your other side. Repeat __________ times. Complete this exercise __________ times a day. Thoracic extension and pectoral stretch Roll a towel or a small blanket so it is about 4 inches (10 cm) in diameter. Lie down on your back on a firm surface.   Put the towel lengthwise, under your spine in the middle of your back. It should not be under your shoulder blades. The towel should line up with your spine from your middle back to your lower back. Put your hands behind your head and let your elbows fall out to your sides. Hold for __________ seconds. Repeat __________ times. Complete this exercise __________ times a day. Strengthening exercises Isometric upper cervical flexion Lie on your back with a thin pillow behind your head and a small rolled-up towel under your neck. Gently tuck your chin toward your chest and nod your head down to look toward your feet. Do not lift your head off the pillow. Hold for __________ seconds. Release the tension slowly. Relax your  neck muscles completely before you repeat this exercise. Repeat __________ times. Complete this exercise __________ times a day. Isometric cervical extension  Stand about 6 inches (15 cm) away from a wall, with your back facing the wall. Place a soft object, about 6-8 inches (15-20 cm) in diameter, between the back of your head and the wall. A soft object could be a small pillow, a ball, or a folded towel. Gently tilt your head back and press into the soft object. Keep your jaw and forehead relaxed. Hold for __________ seconds. Release the tension slowly. Relax your neck muscles completely before you repeat this exercise. Repeat __________ times. Complete this exercise __________ times a day. Posture and body mechanics Body mechanics refers to the movements and positions of your body while you do your daily activities. Posture is part of body mechanics. Good posture and healthy body mechanics can help to relieve stress in your body's tissues and joints. Good posture means that your spine is in its natural S-curve position (your spine is neutral), your shoulders are pulled back slightly, and your head is not tipped forward. The following are general guidelines for applying improved posture andbody mechanics to your everyday activities. Sitting  When sitting, keep your spine neutral and keep your feet flat on the floor. Use a footrest, if necessary, and keep your thighs parallel to the floor. Avoid rounding your shoulders, and avoid tilting your head forward. When working at a desk or a computer, keep your desk at a height where your hands are slightly lower than your elbows. Slide your chair under your desk so you are close enough to maintain good posture. When working at a computer, place your monitor at a height where you are looking straight ahead and you do not have to tilt your head forward or downward to look at the screen.  Standing  When standing, keep your spine neutral and keep your feet  about hip-width apart. Keep a slight bend in your knees. Your ears, shoulders, and hips should line up. When you do a task in which you stand in one place for a long time, place one foot up on a stable object that is 2-4 inches (5-10 cm) high, such as a footstool. This helps keep your spine neutral.  Resting When lying down and resting, avoid positions that are most painful for you. Try to support your neck in a neutral position. You can use a contour pillow or asmall rolled-up towel. Your pillow should support your neck but not push on it. This information is not intended to replace advice given to you by your health care provider. Make sure you discuss any questions you have with your healthcare provider. Document Revised: 01/01/2019 Document Reviewed: 06/12/2018 Elsevier Patient Education  2022  [employer].

## 2021-04-23 DIAGNOSIS — S93401A Sprain of unspecified ligament of right ankle, initial encounter: Secondary | ICD-10-CM | POA: Diagnosis not present

## 2021-04-25 DIAGNOSIS — Z419 Encounter for procedure for purposes other than remedying health state, unspecified: Secondary | ICD-10-CM | POA: Diagnosis not present

## 2021-04-27 ENCOUNTER — Telehealth (INDEPENDENT_AMBULATORY_CARE_PROVIDER_SITE_OTHER): Payer: Medicaid Other | Admitting: Primary Care

## 2021-04-27 ENCOUNTER — Other Ambulatory Visit: Payer: Self-pay

## 2021-04-27 ENCOUNTER — Encounter: Payer: Self-pay | Admitting: Primary Care

## 2021-04-27 VITALS — Ht 65.0 in | Wt 170.0 lb

## 2021-04-27 DIAGNOSIS — M797 Fibromyalgia: Secondary | ICD-10-CM | POA: Diagnosis not present

## 2021-04-27 DIAGNOSIS — R52 Pain, unspecified: Secondary | ICD-10-CM | POA: Diagnosis not present

## 2021-04-27 DIAGNOSIS — F419 Anxiety disorder, unspecified: Secondary | ICD-10-CM

## 2021-04-27 DIAGNOSIS — M25542 Pain in joints of left hand: Secondary | ICD-10-CM

## 2021-04-27 DIAGNOSIS — M25541 Pain in joints of right hand: Secondary | ICD-10-CM | POA: Diagnosis not present

## 2021-04-27 DIAGNOSIS — M3500 Sicca syndrome, unspecified: Secondary | ICD-10-CM | POA: Diagnosis not present

## 2021-04-27 DIAGNOSIS — F32A Depression, unspecified: Secondary | ICD-10-CM

## 2021-04-27 MED ORDER — DULOXETINE HCL 30 MG PO CPEP: 30.0000 mg | ORAL_CAPSULE | Freq: Every day | ORAL | 0 refills | Status: DC

## 2021-04-27 NOTE — Assessment & Plan Note (Signed)
Recent evaluation per rheumatology.

## 2021-04-27 NOTE — Assessment & Plan Note (Signed)
Evaluated by rheumatology, will resume Cymbalta 30 mg daily. Patient will update.

## 2021-04-27 NOTE — Assessment & Plan Note (Signed)
Diagnosed with mild/early osteoarthritis per rheumatology. Will resume Cymbalta 30 mg.

## 2021-04-27 NOTE — Assessment & Plan Note (Signed)
Evaluated by rheumatology, will resume Cymbalta 30 mg daily. She will update.

## 2021-04-27 NOTE — Patient Instructions (Signed)
Resume your duloxetine (Cymbalta) 30 mg once daily for anxiety, depression, and pain.  Please update me in about 4-6 weeks as discussed.  It was a pleasure to see you today!

## 2021-04-27 NOTE — Assessment & Plan Note (Signed)
Deteriorated. Will resume Cymbalta, start with 30 mg. This was effective previously. She will update.

## 2021-04-27 NOTE — Progress Notes (Signed)
Patient ID: Nichole Mcbride, female    DOB: 07-19-1986, 35 y.o.   MRN: [id number]  Virtual visit completed through [employer], a video enabled telemedicine application. Due to national recommendations of social distancing due to COVID-19, a virtual visit is felt to be most appropriate for this patient at this time. Reviewed limitations, risks, security and privacy concerns of performing a virtual visit and the availability of in person appointments. I also reviewed that there may be a patient responsible charge related to this service. The patient agreed to proceed.   Patient location: home Provider location: [facility], office Persons participating in this virtual visit: patient, provider   If any vitals were documented, they were collected by patient at home unless specified below.    Ht 5\' 5"  (1.651 m)   Wt 170 lb (77.1 kg)   BMI 28.29 kg/m    CC: Anxiety and Depression and Joint Pain Subjective:   HPI: Nichole Mcbride is a 35 y.o. female with a history of raynaud's disease, fibromyalgia, sjogren's disease, anxiety and depression, chronc joint pain presenting on 04/27/2021 for Follow-up (On last Rheumatology appointment. )  Evaluated by rheumatology on 04/20/21 for chronic multiple joint aches and recent positive ANA of 1:320 cytoplasmic. During this visit it was determined that her anxiety and depression was not controlled so it was recommended by her rheumatologist to resume Cymbalta as it may help with anxiety/depression and pain.   Today the patient is ready to resume Cymbalta for symptoms of anxiety and depression. She has noticed a gradual return of symptoms over the last several months, is going through another separation from her ex-husband. Symptoms include worry, anxiety, irritability, little motivation do to things. She does remember Cymbalta being effective for anxiety and depression symptoms previously. Over the last year she felt her symptoms were  well controlled with martial arts so she was able to go without.   She has a follow up visit scheduled with her rheumatologist in late August to discuss lab results.      Relevant past medical, surgical, family and social history reviewed and updated as indicated. Interim medical history since our last visit reviewed. Allergies and medications reviewed and updated. Outpatient Medications Prior to Visit  Medication Sig Dispense Refill   ibuprofen (ADVIL) 400 MG tablet Take 400 mg by mouth every 6 (six) hours as needed.     rizatriptan (MAXALT) 5 MG tablet Take 1 tablet by mouth at migraine onset.  May repeat in 2 hours if needed. 10 tablet 0   valACYclovir (VALTREX) 1000 MG tablet Take 2 tablets by mouth twice daily for one day, then take 1 tablet once daily for 3 months. 94 tablet 0   No facility-administered medications prior to visit.     Per HPI unless specifically indicated in ROS section below Review of Systems  Musculoskeletal:  Positive for arthralgias and myalgias.  Psychiatric/Behavioral:  The patient is nervous/anxious.        See HPI  Objective:  Ht 5\' 5"  (1.651 m)   Wt 170 lb (77.1 kg)   BMI 28.29 kg/m   Wt Readings from Last 3 Encounters:  04/27/21 170 lb (77.1 kg)  04/20/21 170 lb 6.4 oz (77.3 kg)  02/15/21 179 lb 12 oz (81.5 kg)       Physical exam: Gen: alert, NAD, not ill appearing Pulm: speaks in complete sentences without increased work of breathing Psych: normal mood, normal thought content      Results for  orders placed or performed in visit on 02/15/21  Uric acid  Result Value Ref Range   Uric Acid, Serum 4.1 2.4 - 7.0 mg/dL  ANA  Result Value Ref Range   Anti Nuclear Antibody (ANA) POSITIVE (A) NEGATIVE  Rheumatoid factor  Result Value Ref Range   Rhuematoid fact SerPl-aCnc <14 <14 IU/mL  Sedimentation rate  Result Value Ref Range   Sed Rate 3 0 - 20 mm/hr  Cyclic citrul peptide antibody, IgG  Result Value Ref Range   Cyclic Citrullin  Peptide Ab <16 UNITS  C-reactive protein  Result Value Ref Range   CRP <1.0 0.5 - 20.0 mg/dL  Anti-nuclear ab-titer (ANA titer)  Result Value Ref Range   ANA Titer 1 1:320 (H) titer   ANA Pattern 1 Cytoplasmic (A)    ANA TITER 1:640 (H) titer   ANA PATTERN Nuclear, Speckled (A)    Assessment & Plan:   Problem List Items Addressed This Visit       Other   Fibromyalgia    Evaluated by rheumatology, will resume Cymbalta 30 mg daily. Patient will update.        Relevant Medications   DULoxetine (CYMBALTA) 30 MG capsule   Anxiety and depression - Primary    Deteriorated. Will resume Cymbalta, start with 30 mg. This was effective previously. She will update.        Relevant Medications   DULoxetine (CYMBALTA) 30 MG capsule   Sjogren's disease (HCC)    Recent evaluation per rheumatology.       Generalized body aches    Evaluated by rheumatology, will resume Cymbalta 30 mg daily. She will update.        Joint pain in both hands    Diagnosed with mild/early osteoarthritis per rheumatology. Will resume Cymbalta 30 mg.         Meds ordered this encounter  Medications   DULoxetine (CYMBALTA) 30 MG capsule    Sig: Take 1 capsule (30 mg total) by mouth daily. For anxiety, depression, and pain.    Dispense:  90 capsule    Refill:  0    Order Specific Question:   Supervising Provider    Answer:   BEDSOLE, AMY E [[id number]]   No orders of the defined types were placed in this encounter.   I discussed the assessment and treatment plan with the patient. The patient was provided an opportunity to ask questions and all were answered. The patient agreed with the plan and demonstrated an understanding of the instructions. The patient was advised to call back or seek an in-person evaluation if the symptoms worsen or if the condition fails to improve as anticipated.  Follow up plan:  Resume your duloxetine (Cymbalta) 30 mg once daily for anxiety, depression, and pain.  Please  update me in about 4-6 weeks as discussed.  It was a pleasure to see you today!   Doreene Nest, NP

## 2021-05-10 NOTE — Progress Notes (Addendum)
Office Visit Note  Patient: Nichole Mcbride             Date of Birth: 05-20-1986           MRN: [id number]             PCP: Doreene Nest, NP Referring: Doreene Nest, NP Visit Date: 05/23/2021 Occupation: @GUAROCC @  Subjective:  Dry mouth and dry eyes.   History of Present Illness: [name] is a 35 y.o. female with history of Sjogren's and fibromyalgia syndrome.  She was accompanied by her mother today.  She states she continues to have dry mouth and dry eye symptoms.  She also gives history of joint pain and joint stiffness.  She has intermittent Raynauds.  She states she recently sprained her left ankle while practicing martial arts.  She is using a brace.  Activities of Daily Living:  Patient reports morning stiffness for 1 hour.   Patient Reports nocturnal pain.  Difficulty dressing/grooming: Denies Difficulty climbing stairs: Denies Difficulty getting out of chair: Denies Difficulty using hands for taps, buttons, cutlery, and/or writing: Denies  Review of Systems  Constitutional:  Positive for fatigue.  HENT:  Positive for mouth dryness. Negative for mouth sores and nose dryness.   Eyes:  Positive for dryness. Negative for pain and itching.  Respiratory:  Negative for shortness of breath and difficulty breathing.   Cardiovascular:  Negative for chest pain and palpitations.  Gastrointestinal:  Negative for blood in stool, constipation and diarrhea.  Endocrine: Negative for increased urination.  Genitourinary:  Negative for difficulty urinating.  Musculoskeletal:  Positive for joint pain, joint pain, myalgias, morning stiffness, muscle tenderness and myalgias. Negative for joint swelling.  Skin:  Positive for color change. Negative for rash and redness.  Allergic/Immunologic: Negative for susceptible to infections.  Neurological:  Positive for dizziness, numbness and headaches. Negative for memory loss and weakness.  Hematological:  Positive  for bruising/bleeding tendency.  Psychiatric/Behavioral:  Negative for confusion.    PMFS History:  Patient Active Problem List   Diagnosis Date Noted   Joint pain in both hands 02/15/2021   Generalized body aches 01/25/2021   Blunt trauma of face 01/25/2021   Rib pain on right side 04/29/2020   MDD (major depressive disorder), recurrent episode, moderate (HCC) 08/08/2018   Prediabetes 01/25/2018   Supervision of other normal pregnancy, antepartum 07/09/2017   BMI 31.0-31.9,adult 07/09/2017   Rash and nonspecific skin eruption 06/15/2016   Herpes labialis 06/15/2016   Preventative health care 05/04/2016   Fibromyalgia 03/23/2016   Anxiety and depression 03/23/2016   Sjogren's disease (HCC) 03/23/2016   Migraine headache 10/14/2011   Raynaud's disease 09/20/2011   SCOLIOSIS 06/10/2008    Past Medical History:  Diagnosis Date   Carpal tunnel syndrome    bilaterally   DEPRESSION 06/10/2008   INSOMNIA 06/10/2008   Migraine headache    Raynaud phenomenon    SCOLIOSIS 06/10/2008   Sjogren's disease (HCC)     Family History  Problem Relation Age of Onset   Transient ischemic attack Mother        x3   Diabetes Mother    Hypertension Mother    Hypothyroidism Mother    Depression Mother    Hypertension Father    Hypothyroidism Father    Depression Brother    Healthy Son    Healthy Son    Bipolar disorder Cousin    Past Surgical History:  Procedure Laterality Date   INTRAUTERINE DEVICE INSERTION  11/05/2009   Social History   Social History Narrative   Married.   2 children.   Teaches English.   Enjoys spending time with family.   Immunization History  Administered Date(s) Administered   Hepatitis A, Adult 04/25/2012, 08/25/2015   Hepatitis B 07/11/1999, 09/30/1999, 01/23/2000   Influenza Whole 11/19/2012   Moderna Sars-Covid-2 Vaccination 02/26/2020, 03/18/2020   Td 07/28/2020   Tdap 04/06/2013     Objective: Vital Signs: BP 133/89 (BP Location: Left Arm,  Patient Position: Sitting, Cuff Size: Normal)   Pulse 79   Ht 5\' 5"  (1.651 m)   Wt 171 lb 3.2 oz (77.7 kg)   BMI 28.49 kg/m    Physical Exam Vitals and nursing note reviewed.  Constitutional:      Appearance: She is well-developed.  HENT:     Head: Normocephalic and atraumatic.  Eyes:     Conjunctiva/sclera: Conjunctivae normal.  Cardiovascular:     Rate and Rhythm: Normal rate and regular rhythm.     Heart sounds: Normal heart sounds.  Pulmonary:     Effort: Pulmonary effort is normal.     Breath sounds: Normal breath sounds.  Abdominal:     General: Bowel sounds are normal.     Palpations: Abdomen is soft.  Musculoskeletal:     Cervical back: Normal range of motion.  Lymphadenopathy:     Cervical: No cervical adenopathy.  Skin:    General: Skin is warm and dry.     Capillary Refill: Capillary refill takes less than 2 seconds.  Neurological:     Mental Status: She is alert and oriented to person, place, and time.  Psychiatric:        Behavior: Behavior normal.     Musculoskeletal Exam: C-spine was in good range of motion.  Shoulder joints, elbow joints, wrist joints, MCPs with good range of motion.  She had bilateral PIP and DIP thickening with no synovitis.  Hip joints and knee joints with good range of motion.  Left ankle joint was in a brace.  She had no tenderness over her right ankle joint or MTPs.  CDAI Exam: CDAI Score: -- Patient Global: --; Provider Global: -- Swollen: --; Tender: -- Joint Exam 05/23/2021   No joint exam has been documented for this visit   There is currently no information documented on the homunculus. Go to the Rheumatology activity and complete the homunculus joint exam.  Investigation: No additional findings.  Imaging: No results found.  Recent Labs: Lab Results  Component Value Date   WBC 4.8 01/25/2021   HGB 13.5 01/25/2021   PLT 280.0 01/25/2021   NA 139 01/25/2021   K 4.4 01/25/2021   CL 104 01/25/2021   CO2 28  01/25/2021   GLUCOSE 93 01/25/2021   BUN 12 01/25/2021   CREATININE 0.86 01/25/2021   BILITOT 0.8 01/25/2021   ALKPHOS 53 01/25/2021   AST 16 01/25/2021   ALT 11 01/25/2021   PROT 6.6 01/25/2021   ALBUMIN 4.3 01/25/2021   CALCIUM 9.1 01/25/2021   GFRAA 137 09/20/2011   April 20, 2021 AVISE lupus index -2.3, ANA 1: 1280NS, anti-SSA positive, anticentromere positive, (dsDNA, Smith, RNP, SSB, SCL 70, RNA polymerase 3, Jo 1 negative), CB CAP negative, antihistone weak positive, anti-C1q negative, anticardiolipin negative, antibeta-2 GP 1 negative, antiphosphatidylserine negative, RF negative, anti-CCP negative, anti- Car P negative, and thyroglobulin positive, anti-TPO positive   Speciality Comments: No specialty comments available.  Procedures:  No procedures performed Allergies: Ciprofloxacin hcl, Imitrex [sumatriptan succinate],  and Sumatriptan   Assessment / Plan:     Visit Diagnoses: Sjogren's syndrome with keratoconjunctivitis sicca (HCC) - History of sicca, fatigue, Raynauds, arthralgias, photosensitivity, miscarriage x1.  Positive ANA, positive Ro, positive anticentromere.  She was diagnosed with Sjogren's in 2015 while she was in [place].  She continues to have dry mouth and dry eye symptoms.  She states her symptoms are manageable with over-the-counter products.  Over-the-counter products were discussed.  I also discussed possible use of pilocarpine which she declined.  I do not see the need for DMARD use at this time.  Possible use of hydroxychloroquine in the future was discussed if she develops inflammatory arthritis.  I do not see any synovitis.AVISE labs were discussed with patient and her mother.  Increased risk of arrhythmias with positive Ro antibody was discussed.  She was advised to get a baseline EKG with her PCP.  She is not planning to have anymore children.  Increased risk of cardiac conduction defects in the fetus was also discussed.  A handout on Sjogren's was provided.  She  denies any shortness of breath.  Raynaud's disease without gangrene - She gives history of her fingers turning white when exposed to colder temperatures since she was a teenager.  No nailbed capillary changes or sclerodactyly note  Pain in both hands - She practices jiu jitsu.  She complains of discomfort in her PIPs and DIPs.  X-ray showed mild PIP narrowing.  Synovitis was noted.  Joint protection was discussed.  Pain in both feet - Prominence of bilateral first MTPs was noted.  X-rays were unremarkable.  Proper fitting shoes were discussed.  Neck pain - She had bilateral trapezius spasm.  X-rays of the cervical spine were unremarkable.  X-ray findings were discussed.  Prediabetes  Fibromyalgia - History of generalized pain, fatigue, myalgias and arthralgias after pregnancy.  She was diagnosed with fibromyalgia in 2015 while she was in [place].  She recently started Cymbalta by her PCP.  We will see response to that.  Need for regular exercise and stretching was emphasized.  Dysmenorrhea  Hx of migraines  History of IBS  Anxiety and depression - Inadequate response to Zoloft, Lexapro and Cymbalta in the past.  She is symptomatic.  We discussed trying low-dose Cymbalta at the last visit.  Herpes labialis  Orders: No orders of the defined types were placed in this encounter.  No orders of the defined types were placed in this encounter.    Follow-Up Instructions: Return in about 6 months (around 11/22/2021) for Sjogren's.   Pollyann Savoy, MD  Note - This record has been created using Animal nutritionist.  Chart creation errors have been sought, but may not always  have been located. Such creation errors do not reflect on  the standard of medical care.

## 2021-05-23 ENCOUNTER — Encounter: Payer: Self-pay | Admitting: Rheumatology

## 2021-05-23 ENCOUNTER — Ambulatory Visit: Payer: Medicaid Other | Admitting: Rheumatology

## 2021-05-23 ENCOUNTER — Other Ambulatory Visit: Payer: Self-pay

## 2021-05-23 VITALS — BP 133/89 | HR 79 | Ht 65.0 in | Wt 171.2 lb

## 2021-05-23 DIAGNOSIS — B001 Herpesviral vesicular dermatitis: Secondary | ICD-10-CM | POA: Diagnosis not present

## 2021-05-23 DIAGNOSIS — Z8719 Personal history of other diseases of the digestive system: Secondary | ICD-10-CM

## 2021-05-23 DIAGNOSIS — R7303 Prediabetes: Secondary | ICD-10-CM | POA: Diagnosis not present

## 2021-05-23 DIAGNOSIS — Z8669 Personal history of other diseases of the nervous system and sense organs: Secondary | ICD-10-CM

## 2021-05-23 DIAGNOSIS — F32A Depression, unspecified: Secondary | ICD-10-CM

## 2021-05-23 DIAGNOSIS — F419 Anxiety disorder, unspecified: Secondary | ICD-10-CM

## 2021-05-23 DIAGNOSIS — N946 Dysmenorrhea, unspecified: Secondary | ICD-10-CM

## 2021-05-23 DIAGNOSIS — M3501 Sicca syndrome with keratoconjunctivitis: Secondary | ICD-10-CM

## 2021-05-23 DIAGNOSIS — M79672 Pain in left foot: Secondary | ICD-10-CM

## 2021-05-23 DIAGNOSIS — M79641 Pain in right hand: Secondary | ICD-10-CM

## 2021-05-23 DIAGNOSIS — M542 Cervicalgia: Secondary | ICD-10-CM | POA: Diagnosis not present

## 2021-05-23 DIAGNOSIS — M3500 Sicca syndrome, unspecified: Secondary | ICD-10-CM

## 2021-05-23 DIAGNOSIS — R768 Other specified abnormal immunological findings in serum: Secondary | ICD-10-CM

## 2021-05-23 DIAGNOSIS — M79671 Pain in right foot: Secondary | ICD-10-CM | POA: Diagnosis not present

## 2021-05-23 DIAGNOSIS — I7300 Raynaud's syndrome without gangrene: Secondary | ICD-10-CM | POA: Diagnosis not present

## 2021-05-23 DIAGNOSIS — M797 Fibromyalgia: Secondary | ICD-10-CM

## 2021-05-23 DIAGNOSIS — M79642 Pain in left hand: Secondary | ICD-10-CM

## 2021-05-23 NOTE — Patient Instructions (Signed)
Sjgren's Syndrome Sjgren's syndrome is a disease in which the body's disease-fighting system (immune system) attacks the glands that produce tears (lacrimal glands) and the glands that produce saliva (salivary glands). This makes the eyes and mouth very dry. Sjgren's syndrome is a long-term (chronic) disorder that has no cure. In some cases, it is linked to other disorders (rheumatic disorders), such as rheumatoid arthritis and systemic lupus erythematosus (SLE). It may affect other parts of the body, such as the: Kidneys. Blood vessels. Joints. Lungs. Liver. Pancreas. Brain. Nerves. Spinal cord. What are the causes? The cause of this condition is not known. It may be passed along from parent to child (inherited), or it may be a symptom of a rheumatic disorder. What increases the risk? This condition is more likely to develop in: Women. People who are 45-50 years old. People who have recently had a viral infection or currently have a viral infection. What are the signs or symptoms? The main symptoms of this condition are: Dry mouth. This may include: A chalky feeling. Difficulty swallowing, speaking, or tasting. Frequent cavities in the teeth. Frequent mouth infections. Dry eyes. This may include: Burning, redness, and itching. Blurry vision. Light sensitivity. Other symptoms may include: Dryness of the skin and the inside of the nose. Eyelid infections. Vaginal dryness, if this applies. Joint pain and stiffness. Muscle pain and stiffness. How is this diagnosed? This condition is diagnosed based on: Your symptoms. Your medical history. A physical exam of your eyes and mouth. Tests, including: A Schirmer test. This tests your tear production. An eye exam that is done with a magnifying device (slit-lamp exam). An eye test that temporarily stains your eye with dye. This shows the extent of eye damage. Tests to check your salivary gland function. Biopsy. This is a removal  of part of a salivary gland from inside your lower lip to be studied under a microscope. Chest X-rays. Blood tests. Urine tests. How is this treated? There is no cure for this condition, but treatment can help you manage your symptoms. This condition may be treated with: Moisture replacement therapies to help relieve dryness in your skin, mouth, and eyes. Medicines to help relieve pain and stiffness. Medicines to help relieve inflammation in your body (corticosteroids). These are usually for severe cases. Medicines to help reduce the activity of your immune system (immunosuppressants). Surgery or insertion of plugs to close the lacrimal glands (punctal occlusion). This helps keep more natural tears in your eyes. Follow these instructions at home: Eye care  Use eye drops as told by your health care provider. Protect your eyes from the sun and wind with sunglasses or glasses. Blink at least 5-6 times a minute. Maintain properly humidified air. You may want to use a humidifier at home. Avoid smoke. Mouth care Brush your teeth and floss after every meal. Chew sugar-free gum or suck on hard candy. This may help to relieve dry mouth. Use antimicrobial mouthwash daily. Take frequent sips of water or sugar-free drinks. Use saliva substitutes or lip balm as told by your health care provider. Schedule and attend dentist visits every 6 months. General instructions  Take over-the-counter and prescription medicines only as told by your health care provider. Drink enough fluid to keep your urine pale yellow. Keep all follow-up visits as told by your health care provider. This is important. Contact a health care provider if: You have a fever. You have night sweats. You are always tired. You have unexplained weight loss. You develop itchy skin. You   have red patches on your skin. You have a lump or swelling on your neck. Summary Sjgren's syndrome is a disease in which the body's  disease-fighting system attacks the glands that produce tears and the glands that produce saliva. This condition makes the eyes and mouth very dry. Sjgren's syndrome is a long-term (chronic) disorder that has no cure. The cause of this condition is not known. There is no cure for this condition, but treatment can help you manage your symptoms. This information is not intended to replace advice given to you by your health care provider. Make sure you discuss any questions you have with your health care provider. Document Revised: 07/18/2018 Document Reviewed: 07/18/2018 Elsevier Patient Education  2022 [employer] Inc[employer]  

## 2021-05-24 ENCOUNTER — Other Ambulatory Visit: Payer: Self-pay | Admitting: Primary Care

## 2021-05-24 DIAGNOSIS — G43909 Migraine, unspecified, not intractable, without status migrainosus: Secondary | ICD-10-CM

## 2021-05-26 DIAGNOSIS — Z419 Encounter for procedure for purposes other than remedying health state, unspecified: Secondary | ICD-10-CM | POA: Diagnosis not present

## 2021-06-25 DIAGNOSIS — Z419 Encounter for procedure for purposes other than remedying health state, unspecified: Secondary | ICD-10-CM | POA: Diagnosis not present

## 2021-07-05 ENCOUNTER — Other Ambulatory Visit: Payer: Self-pay

## 2021-07-05 ENCOUNTER — Ambulatory Visit (INDEPENDENT_AMBULATORY_CARE_PROVIDER_SITE_OTHER): Payer: Medicaid Other | Admitting: Primary Care

## 2021-07-05 VITALS — BP 110/62 | HR 71 | Temp 97.7°F | Ht 65.0 in | Wt 171.0 lb

## 2021-07-05 DIAGNOSIS — M3500 Sicca syndrome, unspecified: Secondary | ICD-10-CM

## 2021-07-05 DIAGNOSIS — Z8349 Family history of other endocrine, nutritional and metabolic diseases: Secondary | ICD-10-CM | POA: Diagnosis not present

## 2021-07-05 DIAGNOSIS — G43909 Migraine, unspecified, not intractable, without status migrainosus: Secondary | ICD-10-CM | POA: Diagnosis not present

## 2021-07-05 DIAGNOSIS — R000 Tachycardia, unspecified: Secondary | ICD-10-CM

## 2021-07-05 DIAGNOSIS — M797 Fibromyalgia: Secondary | ICD-10-CM | POA: Diagnosis not present

## 2021-07-05 LAB — TSH: TSH: 2.92 u[IU]/mL (ref 0.35–5.50)

## 2021-07-05 LAB — T4, FREE: Free T4: 0.59 ng/dL — ABNORMAL LOW (ref 0.60–1.60)

## 2021-07-05 MED ORDER — TOPIRAMATE 50 MG PO TABS: 50.0000 mg | ORAL_TABLET | Freq: Every day | ORAL | 0 refills | Status: DC

## 2021-07-05 MED ORDER — RIZATRIPTAN BENZOATE 10 MG PO TABS: ORAL_TABLET | ORAL | 0 refills | Status: DC

## 2021-07-05 NOTE — Assessment & Plan Note (Addendum)
Following with rheumatology. Office notes from August 2022 reviewed.

## 2021-07-05 NOTE — Assessment & Plan Note (Signed)
Chronic, increased frequency over last several months.  Propranolol caused dizziness so we will trial Topamax 50 mg HS for prevention. She will update.  Will increase Maxalt to 10 mg. She will update.

## 2021-07-05 NOTE — Progress Notes (Signed)
Subjective:    Patient ID: Nichole Mcbride, female    DOB: [birth year], 35 y.o.   MRN: [id number]  HPI  Nichole Mcbride is a very pleasant 35 y.o. female with a history of migraines, anxiety and depression, Sjogren's disease who presents today for EKG, thyroid check, and follow up of migraines.   Following with rheumatology for Sjogren's syndrome and fibromyalgia. Is needing ECG due to increased risk for arrhythmias due to postive Ro antibiotic from recent labs.   Family history of hypothyroidism in mother. No history of thyroid disease in patient. Last TSH was 2.05 in May 2022. She is due for repeat testing today.   Long history of migraines. Overall has been under good control until recently. She is managed on rizatriptan 5 mg PRN for abortive treatment. Previously managed on propranolol ER 80 mg but this was discontinued by the patient due to symptoms of dizziness.   Over the last 2 months she's noticed one migraine weekly which will last for one 1-2 days. She is taking the rizatriptan 5 mg, has had to take another tablet 2 hours later.    Review of Systems  Neurological:  Positive for headaches.  Psychiatric/Behavioral:  The patient is nervous/anxious.         Past Medical History:  Diagnosis Date   Carpal tunnel syndrome    bilaterally   DEPRESSION 06/10/2008   INSOMNIA 06/10/2008   Migraine headache    Raynaud phenomenon    SCOLIOSIS 06/10/2008   Sjogren's disease (HCC)     Social History   Socioeconomic History   Marital status: Married    Spouse name: Not on file   Number of children: Not on file   Years of education: Not on file   Highest education level: Not on file  Occupational History   Not on file  Tobacco Use   Smoking status: Former    Types: Cigarettes   Smokeless tobacco: Never  Vaping Use   Vaping Use: Former  Substance and Sexual Activity   Alcohol use: Yes    Comment: occ   Drug use: No   Sexual activity: Not Currently    Birth  control/protection: None  Other Topics Concern   Not on file  Social History Narrative   Married.   2 children.   Teaches English.   Enjoys spending time with family.   Social Determinants of Health   Financial Resource Strain: Not on file  Food Insecurity: Not on file  Transportation Needs: Not on file  Physical Activity: Not on file  Stress: Not on file  Social Connections: Not on file  Intimate Partner Violence: Not on file    Past Surgical History:  Procedure Laterality Date   INTRAUTERINE DEVICE INSERTION  11/05/2009    Family History  Problem Relation Age of Onset   Transient ischemic attack Mother        x3   Diabetes Mother    Hypertension Mother    Hypothyroidism Mother    Depression Mother    Hypertension Father    Hypothyroidism Father    Depression Brother    Healthy Son    Healthy Son    Bipolar disorder Cousin     Allergies  Allergen Reactions   Ciprofloxacin Hcl Other (See Comments)    unknown   Imitrex [Sumatriptan Succinate] Diarrhea and Nausea And Vomiting    severe   Sumatriptan Other (See Comments)    "tightness in my chest and dizziness"    Current  Outpatient Medications on File Prior to Visit  Medication Sig Dispense Refill   DULoxetine (CYMBALTA) 30 MG capsule Take 1 capsule (30 mg total) by mouth daily. For anxiety, depression, and pain. 90 capsule 0   ibuprofen (ADVIL) 400 MG tablet Take 400 mg by mouth every 6 (six) hours as needed.     rizatriptan (MAXALT) 5 MG tablet TAKE 1 TABLET BY MOUTH AT ONSET OF MIGRAINE. MAY REPEAT IN 2 HOURS AS NEEDED 10 tablet 0   valACYclovir (VALTREX) 1000 MG tablet Take 2 tablets by mouth twice daily for one day, then take 1 tablet once daily for 3 months. 94 tablet 0   No current facility-administered medications on file prior to visit.    BP 110/62   Pulse 71   Temp 97.7 F (36.5 C) (Temporal)   Ht 5\' 5"  (1.651 m)   Wt 171 lb (77.6 kg)   SpO2 100%   BMI 28.46 kg/m  Objective:   Physical  Exam Cardiovascular:     Rate and Rhythm: Normal rate and regular rhythm.  Pulmonary:     Effort: Pulmonary effort is normal.     Breath sounds: Normal breath sounds.  Musculoskeletal:     Cervical back: Neck supple.  Skin:    General: Skin is warm and dry.  Psychiatric:        Mood and Affect: Mood normal.          Assessment & Plan:      This visit occurred during the SARS-CoV-2 public health emergency.  Safety protocols were in place, including screening questions prior to the visit, additional usage of staff PPE, and extensive cleaning of exam room while observing appropriate contact time as indicated for disinfecting solutions.

## 2021-07-05 NOTE — Patient Instructions (Addendum)
Stop by the lab prior to leaving today. I will notify you of your results once received.   We increased the dose of your rizatriptan (Maxalt) to 10 mg. Use this for bad migraines.  You can try topiramate (Topamax) 50 mg at bedtime for headache prevention.   It was a pleasure to see you today!

## 2021-07-05 NOTE — Assessment & Plan Note (Signed)
Following with rheumatology. Office notes from August 2022 reviewed.  ECG today with NSR with rate of 76. No arrhythmias noted.

## 2021-07-05 NOTE — Assessment & Plan Note (Signed)
In both parents.  TSH has always been unremarkable.  Checking TSH, Free T4, TPO today.

## 2021-07-06 LAB — THYROID PEROXIDASE ANTIBODY: Thyroperoxidase Ab SerPl-aCnc: 169 IU/mL — ABNORMAL HIGH (ref <9)

## 2021-07-23 ENCOUNTER — Other Ambulatory Visit: Payer: Self-pay | Admitting: Primary Care

## 2021-07-23 DIAGNOSIS — M797 Fibromyalgia: Secondary | ICD-10-CM

## 2021-07-23 DIAGNOSIS — F32A Depression, unspecified: Secondary | ICD-10-CM

## 2021-07-23 DIAGNOSIS — F419 Anxiety disorder, unspecified: Secondary | ICD-10-CM

## 2021-07-26 DIAGNOSIS — Z419 Encounter for procedure for purposes other than remedying health state, unspecified: Secondary | ICD-10-CM | POA: Diagnosis not present

## 2021-07-28 ENCOUNTER — Ambulatory Visit: Payer: 59 | Admitting: Primary Care

## 2021-08-01 ENCOUNTER — Other Ambulatory Visit: Payer: Self-pay | Admitting: Primary Care

## 2021-08-01 DIAGNOSIS — G43909 Migraine, unspecified, not intractable, without status migrainosus: Secondary | ICD-10-CM

## 2021-08-25 DIAGNOSIS — Z419 Encounter for procedure for purposes other than remedying health state, unspecified: Secondary | ICD-10-CM | POA: Diagnosis not present

## 2021-09-03 ENCOUNTER — Other Ambulatory Visit: Payer: Self-pay | Admitting: Primary Care

## 2021-09-03 DIAGNOSIS — B001 Herpesviral vesicular dermatitis: Secondary | ICD-10-CM

## 2021-09-03 DIAGNOSIS — G43909 Migraine, unspecified, not intractable, without status migrainosus: Secondary | ICD-10-CM

## 2021-09-04 NOTE — Telephone Encounter (Signed)
Please call patient:  Received Valtrex refill. The plan was to take for three months consistently then stop. Has she noticed a return in outbreaks since stopping Valtrex?  Also, how are her migraines/headaches? Any better? We refilled her Maxalt in mid October 2022.  Is she still taking Topamax 50 mg?

## 2021-09-05 NOTE — Telephone Encounter (Signed)
Called patient she last tok the Valtrex about a month ago and has had two outbreaks in that time. She is currently having one now.   Her headaches have improved she is taking the Topamax daily and the Maxalt 1-2 times a month. And she does need to have refill of both.

## 2021-09-05 NOTE — Telephone Encounter (Signed)
Noted. Refill(s) sent to pharmacy.  

## 2021-09-25 DIAGNOSIS — Z419 Encounter for procedure for purposes other than remedying health state, unspecified: Secondary | ICD-10-CM | POA: Diagnosis not present

## 2021-10-24 ENCOUNTER — Telehealth: Payer: Self-pay | Admitting: Primary Care

## 2021-10-24 ENCOUNTER — Other Ambulatory Visit: Payer: Self-pay | Admitting: Primary Care

## 2021-10-24 ENCOUNTER — Other Ambulatory Visit (INDEPENDENT_AMBULATORY_CARE_PROVIDER_SITE_OTHER): Payer: 59

## 2021-10-24 DIAGNOSIS — E038 Other specified hypothyroidism: Secondary | ICD-10-CM

## 2021-10-24 DIAGNOSIS — E063 Autoimmune thyroiditis: Secondary | ICD-10-CM

## 2021-10-24 NOTE — Telephone Encounter (Signed)
Labs have been done today right?

## 2021-10-24 NOTE — Telephone Encounter (Signed)
Yes, labs completed today.

## 2021-10-24 NOTE — Telephone Encounter (Signed)
[name] came in and stated that she had an appointment with Anda Kraft a few months ago was suppose to have labs done 3 months but wanted to know about getting them done.

## 2021-10-25 DIAGNOSIS — E038 Other specified hypothyroidism: Secondary | ICD-10-CM

## 2021-10-25 LAB — TSH: TSH: 2.41 u[IU]/mL (ref 0.450–4.500)

## 2021-10-25 LAB — T4, FREE: Free T4: 0.89 ng/dL (ref 0.82–1.77)

## 2021-10-26 DIAGNOSIS — Z419 Encounter for procedure for purposes other than remedying health state, unspecified: Secondary | ICD-10-CM | POA: Diagnosis not present
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
## 2021-11-22 ENCOUNTER — Ambulatory Visit (INDEPENDENT_AMBULATORY_CARE_PROVIDER_SITE_OTHER): Payer: Medicaid Other | Admitting: Physician Assistant

## 2021-11-22 ENCOUNTER — Encounter: Payer: Self-pay | Admitting: Physician Assistant

## 2021-11-22 ENCOUNTER — Other Ambulatory Visit: Payer: Self-pay

## 2021-11-22 VITALS — BP 127/84 | HR 71 | Ht 65.0 in | Wt 179.4 lb

## 2021-11-22 DIAGNOSIS — M542 Cervicalgia: Secondary | ICD-10-CM

## 2021-11-22 DIAGNOSIS — F419 Anxiety disorder, unspecified: Secondary | ICD-10-CM

## 2021-11-22 DIAGNOSIS — M79671 Pain in right foot: Secondary | ICD-10-CM | POA: Diagnosis not present

## 2021-11-22 DIAGNOSIS — M797 Fibromyalgia: Secondary | ICD-10-CM

## 2021-11-22 DIAGNOSIS — Z8669 Personal history of other diseases of the nervous system and sense organs: Secondary | ICD-10-CM

## 2021-11-22 DIAGNOSIS — N946 Dysmenorrhea, unspecified: Secondary | ICD-10-CM

## 2021-11-22 DIAGNOSIS — M79642 Pain in left hand: Secondary | ICD-10-CM

## 2021-11-22 DIAGNOSIS — I7300 Raynaud's syndrome without gangrene: Secondary | ICD-10-CM

## 2021-11-22 DIAGNOSIS — M79641 Pain in right hand: Secondary | ICD-10-CM | POA: Diagnosis not present

## 2021-11-22 DIAGNOSIS — B001 Herpesviral vesicular dermatitis: Secondary | ICD-10-CM

## 2021-11-22 DIAGNOSIS — R7303 Prediabetes: Secondary | ICD-10-CM | POA: Diagnosis not present

## 2021-11-22 DIAGNOSIS — M3501 Sicca syndrome with keratoconjunctivitis: Secondary | ICD-10-CM | POA: Diagnosis not present

## 2021-11-22 DIAGNOSIS — F32A Depression, unspecified: Secondary | ICD-10-CM

## 2021-11-22 DIAGNOSIS — Z8719 Personal history of other diseases of the digestive system: Secondary | ICD-10-CM

## 2021-11-22 DIAGNOSIS — M79672 Pain in left foot: Secondary | ICD-10-CM

## 2021-11-23 DIAGNOSIS — Z419 Encounter for procedure for purposes other than remedying health state, unspecified: Secondary | ICD-10-CM | POA: Diagnosis not present

## 2021-12-22 ENCOUNTER — Other Ambulatory Visit: Payer: Self-pay | Admitting: Primary Care

## 2021-12-22 DIAGNOSIS — G43909 Migraine, unspecified, not intractable, without status migrainosus: Secondary | ICD-10-CM

## 2021-12-22 MED ORDER — RIZATRIPTAN BENZOATE 10 MG PO TABS: ORAL_TABLET | ORAL | 0 refills | Status: DC

## 2021-12-24 DIAGNOSIS — Z419 Encounter for procedure for purposes other than remedying health state, unspecified: Secondary | ICD-10-CM | POA: Diagnosis not present

## 2022-01-23 DIAGNOSIS — Z419 Encounter for procedure for purposes other than remedying health state, unspecified: Secondary | ICD-10-CM | POA: Diagnosis not present

## 2022-02-16 ENCOUNTER — Other Ambulatory Visit (INDEPENDENT_AMBULATORY_CARE_PROVIDER_SITE_OTHER): Payer: Medicaid Other | Admitting: Primary Care

## 2022-02-16 ENCOUNTER — Other Ambulatory Visit: Payer: Self-pay | Admitting: Primary Care

## 2022-02-16 DIAGNOSIS — G43909 Migraine, unspecified, not intractable, without status migrainosus: Secondary | ICD-10-CM

## 2022-02-17 MED ORDER — TOPIRAMATE 50 MG PO TABS: 50.0000 mg | ORAL_TABLET | Freq: Two times a day (BID) | ORAL | 0 refills | Status: DC

## 2022-02-17 NOTE — Telephone Encounter (Signed)
Please see the MyChart message reply(ies) for my assessment and plan.  The patient gave consent for this Medical Advice Message and is aware that it may result in a bill to their insurance company as well as the possibility that this may result in a co-payment or deductible. They are an established patient, but are not seeking medical advice exclusively about a problem treated during an in person or video visit in the last 7 days. I did not recommend an in person or video visit within 7 days of my reply.  I spent a total of 15 minutes cumulative time within 7 days through MyChart messaging Mariacristina Aday K Fatiha Guzy, NP  

## 2022-02-23 DIAGNOSIS — Z419 Encounter for procedure for purposes other than remedying health state, unspecified: Secondary | ICD-10-CM | POA: Diagnosis not present

## 2022-03-08 DIAGNOSIS — M797 Fibromyalgia: Secondary | ICD-10-CM | POA: Diagnosis not present

## 2022-03-08 DIAGNOSIS — S67191A Crushing injury of left index finger, initial encounter: Secondary | ICD-10-CM | POA: Diagnosis not present

## 2022-03-24 ENCOUNTER — Ambulatory Visit (INDEPENDENT_AMBULATORY_CARE_PROVIDER_SITE_OTHER): Payer: Medicaid Other | Admitting: Primary Care

## 2022-03-24 VITALS — BP 124/82 | HR 73 | Temp 98.3°F | Ht 65.0 in | Wt 188.0 lb

## 2022-03-24 DIAGNOSIS — G43909 Migraine, unspecified, not intractable, without status migrainosus: Secondary | ICD-10-CM

## 2022-03-24 DIAGNOSIS — R300 Dysuria: Secondary | ICD-10-CM | POA: Diagnosis not present

## 2022-03-24 LAB — POC URINALSYSI DIPSTICK (AUTOMATED)
Bilirubin, UA: NEGATIVE
Blood, UA: NEGATIVE
Glucose, UA: NEGATIVE
Ketones, UA: NEGATIVE
Nitrite, UA: NEGATIVE
Protein, UA: NEGATIVE
Spec Grav, UA: >=1.03 — AB (ref 1.010–1.025)
Urobilinogen, UA: 0.2 E.U./dL
pH, UA: 6 (ref 5.0–8.0)

## 2022-03-24 MED ORDER — AMITRIPTYLINE HCL 25 MG PO TABS: 25.0000 mg | ORAL_TABLET | Freq: Every day | ORAL | 0 refills | Status: DC

## 2022-03-24 MED ORDER — ONDANSETRON 4 MG PO TBDP: 4.0000 mg | ORAL_TABLET | Freq: Three times a day (TID) | ORAL | 0 refills | Status: DC | PRN

## 2022-03-24 MED ORDER — KETOROLAC TROMETHAMINE 60 MG/2ML IM SOLN
60.0000 mg | Freq: Once | INTRAMUSCULAR | Status: AC
  Administered 2022-03-24: 60 mg via INTRAMUSCULAR

## 2022-03-24 MED ORDER — CYCLOBENZAPRINE HCL 5 MG PO TABS: 5.0000 mg | ORAL_TABLET | Freq: Every day | ORAL | 0 refills | Status: DC | PRN

## 2022-03-24 NOTE — Patient Instructions (Addendum)
You will be contacted regarding your MRI.  Please let us know if you have not been contacted within two weeks.   Start amitriptyline 25 mg at bedtime for migraine prevention.  Reduce Topamax to 1 tablet nightly for 1 week, then stop.   Start amitriptyline for migraine prevention, start with 1/2 tablet nightly for 1 week, then increase to 1 tablet thereafter.  You can use cyclobenzaprine tablets as needed for migraine/neck pain during migraine.  Please schedule a follow up visit to meet back with me in 2-3 weeks for migraine follow up.  It was a pleasure to see you today!

## 2022-03-24 NOTE — Assessment & Plan Note (Signed)
UA today with 3+ leuks, otherwise negative. Urine culture pending.  Await results.

## 2022-03-24 NOTE — Assessment & Plan Note (Signed)
Uncontrolled.  Wean off Topamax. Start amitriptyline 25 mg, 1/2 tablet daily for 1 week, then increase to 1 full tablet thereafter. Continue Maxalt 10 mg as needed.  Start cyclobenzaprine 5 mg as needed for migraine/neck involvement. Start Zofran 4 mg ODT as needed for nausea during migraines.  IM Toradol 60 mg provided today.  MRI brain ordered and pending.  Follow-up in 2 to 3 weeks.

## 2022-03-24 NOTE — Progress Notes (Signed)
Subjective:    Patient ID: Nichole Mcbride, female    DOB: 10-11-1985, 36 y.o.   MRN: [id number]  Migraine  Associated symptoms include nausea and photophobia.  Dysuria  Associated symptoms include frequency and nausea. Pertinent negatives include no flank pain or hematuria.    Nichole Mcbride is a very pleasant 36 y.o. female with a history of migraines, raynaud's disease, fibromyalgia, prediabetes, sjogren's diseaes who presents today to discuss multiple concerns.  1) Migraines: Chronic history of migraines, increased frequency over the last 2 months. She's experiencing migraines once weekly that last for 2 days. She is now waking up with migraines.   Currently managed on Topamax 100 mg for which was increased 4 weeks ago due to increased frequency of migraines. Since her dose increase of Topamax she's not noticed improvement in migraines.   Her migraines occur to the frontal lobes with sharp pain behind either eyes with radiation around to her occipital lobes and base of her neck. Her neck will be sore for days. She does experience nausea and photophobia.   She does notice resolve in migraines with Maxalt, sometimes takes 2 doses. She was once managed on propranolol for migraine prevention but this was not effective.   She denies increased stress, changes medications, changes in food.   BP Readings from Last 3 Encounters:  03/24/22 124/82  11/22/21 127/84  07/05/21 110/62    2) Urinary Frequency: Also with suprapubic pressure, and burning with urination. This began 2 days ago. She denies vaginal discharge, hematuria, abdominal pain.      Review of Systems  Eyes:  Positive for photophobia.  Respiratory:  Negative for shortness of breath.   Cardiovascular:  Negative for chest pain.  Gastrointestinal:  Positive for nausea.  Genitourinary:  Positive for dysuria and frequency. Negative for flank pain, hematuria and vaginal discharge.  Neurological:  Positive for  headaches.         Past Medical History:  Diagnosis Date   Carpal tunnel syndrome    bilaterally   DEPRESSION 06/10/2008   Hashimoto's thyroiditis    INSOMNIA 06/10/2008   Migraine headache    Raynaud phenomenon    SCOLIOSIS 06/10/2008   Sjogren's disease (HCC)     Social History   Socioeconomic History   Marital status: Divorced    Spouse name: Not on file   Number of children: Not on file   Years of education: Not on file   Highest education level: Not on file  Occupational History   Not on file  Tobacco Use   Smoking status: Former    Types: Cigarettes    Passive exposure: Past   Smokeless tobacco: Never  Vaping Use   Vaping Use: Former  Substance and Sexual Activity   Alcohol use: Yes    Comment: occ   Drug use: No   Sexual activity: Not Currently    Birth control/protection: None  Other Topics Concern   Not on file  Social History Narrative   Married.   2 children.   Teaches English.   Enjoys spending time with family.   Social Determinants of Health   Financial Resource Strain: Not on file  Food Insecurity: Not on file  Transportation Needs: Not on file  Physical Activity: Not on file  Stress: Not on file  Social Connections: Not on file  Intimate Partner Violence: Not on file    Past Surgical History:  Procedure Laterality Date   INTRAUTERINE DEVICE INSERTION  11/05/2009    Family  History  Problem Relation Age of Onset   Transient ischemic attack Mother        x3   Diabetes Mother    Hypertension Mother    Hypothyroidism Mother    Depression Mother    Hypertension Father    Hypothyroidism Father    Rheum arthritis Father    Depression Brother    Healthy Son    Healthy Son    Bipolar disorder Cousin     Allergies  Allergen Reactions   Ciprofloxacin Hcl Other (See Comments)    unknown   Imitrex [Sumatriptan Succinate] Diarrhea and Nausea And Vomiting    severe   Sumatriptan Other (See Comments)    "tightness in my chest  and dizziness"    Current Outpatient Medications on File Prior to Visit  Medication Sig Dispense Refill   DULoxetine (CYMBALTA) 30 MG capsule TAKE 1 CAPSULE(30 MG) BY MOUTH DAILY FOR ANXIETY OR DEPRESSION OR PAIN 90 capsule 2   ibuprofen (ADVIL) 400 MG tablet Take 400 mg by mouth every 6 (six) hours as needed.     rizatriptan (MAXALT) 10 MG tablet TAKE 1 TABLET BY MOUTH AT ONSET OF MIGRAINE. MAY REPEAT IN 2 HOURS AS NEEDED 10 tablet 0   SELENIUM PO Take by mouth daily.     valACYclovir (VALTREX) 1000 MG tablet TAKE 2 TABLETS BY MOUTH TWICE DAILY FOR 1 DAY THEN TAKE 1 TABLET BY MOUTH EVERY DAY FOR 3 MONTHS 94 tablet 0   No current facility-administered medications on file prior to visit.    BP 124/82   Pulse 73   Temp 98.3 F (36.8 C) (Oral)   Ht 5\' 5"  (1.651 m)   Wt 188 lb (85.3 kg)   SpO2 97%   BMI 31.28 kg/m  Objective:   Physical Exam Eyes:     Extraocular Movements: Extraocular movements intact.  Cardiovascular:     Rate and Rhythm: Normal rate and regular rhythm.  Pulmonary:     Effort: Pulmonary effort is normal.     Breath sounds: Normal breath sounds.  Musculoskeletal:     Cervical back: Neck supple.  Skin:    General: Skin is warm and dry.  Neurological:     Cranial Nerves: No cranial nerve deficit.           Assessment & Plan:   Problem List Items Addressed This Visit       Cardiovascular and Mediastinum   Migraines - Primary    Uncontrolled.  Wean off Topamax. Start amitriptyline 25 mg, 1/2 tablet daily for 1 week, then increase to 1 full tablet thereafter. Continue Maxalt 10 mg as needed.  Start cyclobenzaprine 5 mg as needed for migraine/neck involvement. Start Zofran 4 mg ODT as needed for nausea during migraines.  IM Toradol 60 mg provided today.  MRI brain ordered and pending.  Follow-up in 2 to 3 weeks.      Relevant Medications   cyclobenzaprine (FLEXERIL) 5 MG tablet   ondansetron (ZOFRAN-ODT) 4 MG disintegrating tablet    amitriptyline (ELAVIL) 25 MG tablet   Other Relevant Orders   MR Brain Wo Contrast   Other Visit Diagnoses     Dysuria       Relevant Orders   POCT Urinalysis Dipstick (Automated) (Completed)   Urine Culture          , NP

## 2022-03-25 DIAGNOSIS — Z419 Encounter for procedure for purposes other than remedying health state, unspecified: Secondary | ICD-10-CM | POA: Diagnosis not present

## 2022-03-25 LAB — URINE CULTURE
MICRO NUMBER:: 13594405
Result:: NO GROWTH
SPECIMEN QUALITY:: ADEQUATE

## 2022-04-10 ENCOUNTER — Other Ambulatory Visit: Payer: Self-pay | Admitting: Primary Care

## 2022-04-10 DIAGNOSIS — F419 Anxiety disorder, unspecified: Secondary | ICD-10-CM

## 2022-04-10 DIAGNOSIS — M797 Fibromyalgia: Secondary | ICD-10-CM

## 2022-04-11 ENCOUNTER — Other Ambulatory Visit: Payer: Self-pay | Admitting: Primary Care

## 2022-04-11 DIAGNOSIS — G43909 Migraine, unspecified, not intractable, without status migrainosus: Secondary | ICD-10-CM

## 2022-04-14 ENCOUNTER — Ambulatory Visit: Payer: Medicaid Other | Admitting: Primary Care

## 2022-04-17 DIAGNOSIS — S161XXA Strain of muscle, fascia and tendon at neck level, initial encounter: Secondary | ICD-10-CM | POA: Diagnosis not present

## 2022-04-17 DIAGNOSIS — M542 Cervicalgia: Secondary | ICD-10-CM | POA: Diagnosis not present

## 2022-04-17 DIAGNOSIS — M797 Fibromyalgia: Secondary | ICD-10-CM | POA: Diagnosis not present

## 2022-04-25 DIAGNOSIS — Z419 Encounter for procedure for purposes other than remedying health state, unspecified: Secondary | ICD-10-CM | POA: Diagnosis not present

## 2022-05-02 ENCOUNTER — Ambulatory Visit (INDEPENDENT_AMBULATORY_CARE_PROVIDER_SITE_OTHER): Payer: Medicaid Other | Admitting: Primary Care

## 2022-05-02 ENCOUNTER — Encounter: Payer: Self-pay | Admitting: Primary Care

## 2022-05-02 DIAGNOSIS — G43909 Migraine, unspecified, not intractable, without status migrainosus: Secondary | ICD-10-CM

## 2022-05-02 MED ORDER — AMITRIPTYLINE HCL 25 MG PO TABS: 25.0000 mg | ORAL_TABLET | Freq: Every day | ORAL | 1 refills | Status: DC

## 2022-05-02 NOTE — Patient Instructions (Addendum)
Call [facility] to schedule your MRI brain.  [phone]  Please schedule a physical to meet with me in 3 months.   It was a pleasure to see you today!

## 2022-05-02 NOTE — Assessment & Plan Note (Addendum)
Improving.  Continue amitriptyline 25 mg HS. Continue cyclobenzaprine 5 mg and Zofran 4 mg PRN. Continue Maxalt 10 mg PRN.  Consider dose increase of amitriptyline to 50 mg if warranted.   Phone number provided to patient to schedule MRI brain.

## 2022-05-02 NOTE — Progress Notes (Signed)
Subjective:    Patient ID: Nichole Mcbride, female    DOB: Jul 19, 1986, 36 y.o.   MRN: [id number]  Migraine     Nichole Mcbride is a very pleasant 36 y.o. female with a history of migraines, anxiety depression, Sjogren's disease, prediabetes, MDD, fibromyalgia who presents today for follow-up of migraines.  She was last evaluated on 03/24/2022 for evaluation of uncontrolled migraines, increased frequency over the last 2 months despite management on Topamax 100 mg at bedtime and 2 doses of Maxalt.  During this visit we weaned her off of Topamax and initiated amitriptyline 25 mg at bedtime.  Her Maxalt 10 mg as needed was continued.  We provided her with cyclobenzaprine 5 mg to use as needed for neck induced migraines.  We provided her with Zofran 4 mg ODT to use as needed for nausea during migraines.  She was asked to follow-up 2 to 3 weeks later.  Since her last visit she has weaned off of Topamax completely. She started her amitriptyline 25 mg during the second week of July. She's noticed a reduced frequency of her migraines. She has had two migraines since her last visit, overall less frequent. She denies general headaches in between migraines.   She initially noticed drowsiness with amitriptyline but this has resolved. The cyclobenzaprine has helped to prevent a neck induced migraines. She has used Zofran twice with improvement. She has yet to hear back regarding scheduling the MRI of her brain.   Review of Systems  Respiratory:  Negative for shortness of breath.   Cardiovascular:  Negative for chest pain.  Neurological:  Negative for headaches.       See HPI         Past Medical History:  Diagnosis Date   Carpal tunnel syndrome    bilaterally   DEPRESSION 06/10/2008   Hashimoto's thyroiditis    INSOMNIA 06/10/2008   Migraine headache    Raynaud phenomenon    SCOLIOSIS 06/10/2008   Sjogren's disease (HCC)     Social History   Socioeconomic History   Marital  status: Divorced    Spouse name: Not on file   Number of children: Not on file   Years of education: Not on file   Highest education level: Not on file  Occupational History   Not on file  Tobacco Use   Smoking status: Former    Types: Cigarettes    Passive exposure: Past   Smokeless tobacco: Never  Vaping Use   Vaping Use: Former  Substance and Sexual Activity   Alcohol use: Yes    Comment: occ   Drug use: No   Sexual activity: Not Currently    Birth control/protection: None  Other Topics Concern   Not on file  Social History Narrative   Married.   2 children.   Teaches English.   Enjoys spending time with family.   Social Determinants of Health   Financial Resource Strain: Not on file  Food Insecurity: Not on file  Transportation Needs: Not on file  Physical Activity: Not on file  Stress: Not on file  Social Connections: Not on file  Intimate Partner Violence: Not on file    Past Surgical History:  Procedure Laterality Date   INTRAUTERINE DEVICE INSERTION  11/05/2009    Family History  Problem Relation Age of Onset   Transient ischemic attack Mother        x3   Diabetes Mother    Hypertension Mother    Hypothyroidism Mother  Depression Mother    Hypertension Father    Hypothyroidism Father    Rheum arthritis Father    Depression Brother    Healthy Son    Healthy Son    Bipolar disorder Cousin     Allergies  Allergen Reactions   Ciprofloxacin Hcl Other (See Comments)    unknown   Imitrex [Sumatriptan Succinate] Diarrhea and Nausea And Vomiting    severe   Sumatriptan Other (See Comments)    "tightness in my chest and dizziness"    Current Outpatient Medications on File Prior to Visit  Medication Sig Dispense Refill   cyclobenzaprine (FLEXERIL) 5 MG tablet Take 1 tablet (5 mg total) by mouth daily as needed (migraines). 30 tablet 0   DULoxetine (CYMBALTA) 30 MG capsule TAKE 1 CAPSULE(30 MG) BY MOUTH DAILY FOR ANXIETY OR DEPRESSION OR PAIN 90  capsule 0   ibuprofen (ADVIL) 400 MG tablet Take 400 mg by mouth every 6 (six) hours as needed.     ondansetron (ZOFRAN-ODT) 4 MG disintegrating tablet Take 1 tablet (4 mg total) by mouth every 8 (eight) hours as needed for nausea or vomiting. 20 tablet 0   rizatriptan (MAXALT) 10 MG tablet TAKE 1 TABLET BY MOUTH AT ONSET OF MIGRAINE. MAY REPEAT IN 2 HOURS AS NEEDED 10 tablet 0   SELENIUM PO Take by mouth daily.     valACYclovir (VALTREX) 1000 MG tablet TAKE 2 TABLETS BY MOUTH TWICE DAILY FOR 1 DAY THEN TAKE 1 TABLET BY MOUTH EVERY DAY FOR 3 MONTHS 94 tablet 0   No current facility-administered medications on file prior to visit.    BP 110/82   Pulse (!) 106   Temp 98.6 F (37 C) (Oral)   Ht 5\' 5"  (1.651 m)   Wt 198 lb (89.8 kg)   SpO2 98%   BMI 32.95 kg/m  Objective:   Physical Exam Cardiovascular:     Rate and Rhythm: Normal rate and regular rhythm.  Pulmonary:     Effort: Pulmonary effort is normal.     Breath sounds: Normal breath sounds.  Musculoskeletal:     Cervical back: Neck supple.  Skin:    General: Skin is warm and dry.           Assessment & Plan:   Problem List Items Addressed This Visit       Cardiovascular and Mediastinum   Migraines    Improving.  Continue amitriptyline 25 mg HS. Continue cyclobenzaprine 5 mg and Zofran 4 mg PRN. Continue Maxalt 10 mg PRN.  Consider dose increase of amitriptyline to 50 mg if warranted.   Phone number provided to patient to schedule MRI brain.      Relevant Medications   amitriptyline (ELAVIL) 25 MG tablet       , NP

## 2022-05-06 ENCOUNTER — Other Ambulatory Visit: Payer: Self-pay | Admitting: Primary Care

## 2022-05-06 DIAGNOSIS — G43909 Migraine, unspecified, not intractable, without status migrainosus: Secondary | ICD-10-CM

## 2022-05-09 ENCOUNTER — Other Ambulatory Visit: Payer: Self-pay | Admitting: Primary Care

## 2022-05-09 DIAGNOSIS — B001 Herpesviral vesicular dermatitis: Secondary | ICD-10-CM

## 2022-05-10 ENCOUNTER — Encounter: Payer: Self-pay | Admitting: Internal Medicine

## 2022-05-10 ENCOUNTER — Ambulatory Visit (INDEPENDENT_AMBULATORY_CARE_PROVIDER_SITE_OTHER): Payer: Medicaid Other | Admitting: Internal Medicine

## 2022-05-10 VITALS — BP 124/70 | HR 95 | Ht 65.0 in | Wt 195.0 lb

## 2022-05-10 DIAGNOSIS — E063 Autoimmune thyroiditis: Secondary | ICD-10-CM

## 2022-05-10 NOTE — Progress Notes (Unsigned)
Name: Nichole Mcbride  MRN/ DOB: [id number], [birth year]    Age/ Sex: 36 y.o., female    PCP: Doreene Nest, NP   Reason for Endocrinology Evaluation: Hashimoto's Disease      Date of Initial Endocrinology Evaluation: 05/10/2022     HPI: [name] is a 36 y.o. female with a past medical history of Sjogren's disease, depression, fibromylagia . The patient presented for initial endocrinology clinic visit on 05/10/2022 for consultative assistance with her Hashimoto's disease .   Pt with elevated TPO Ab's at 169 IU/mL but normal FT4 and TSH   Has alternating constipation and diarrhea  Has noted local neck swelling over the past few weeks  Weight has been fluctuating  She has stable depression   She sees rheumatology for Sjogren's disease but not on treatment  No Biotin   She has fatigue and occasional sleep issues  LMP ~ 2 weeks ago , regular   Bother parents with thyroid disease  Has 2 boys 9 and 12   HISTORY:  Past Medical History:  Past Medical History:  Diagnosis Date   Carpal tunnel syndrome    bilaterally   DEPRESSION 06/10/2008   Hashimoto's thyroiditis    INSOMNIA 06/10/2008   Migraine headache    Raynaud phenomenon    SCOLIOSIS 06/10/2008   Sjogren's disease (HCC)    Past Surgical History:  Past Surgical History:  Procedure Laterality Date   INTRAUTERINE DEVICE INSERTION  11/05/2009    Social History:  reports that she has quit smoking. Her smoking use included cigarettes. She has been exposed to tobacco smoke. She has never used smokeless tobacco. She reports current alcohol use. She reports that she does not use drugs. Family History: family history includes Bipolar disorder in her cousin; Depression in her brother and mother; Diabetes in her mother; Healthy in her son and son; Hypertension in her father and mother; Hypothyroidism in her father and mother; Rheum arthritis in her father; Transient ischemic attack in her  mother.   HOME MEDICATIONS: Allergies as of 05/10/2022       Reactions   Ciprofloxacin Hcl Other (See Comments)   unknown   Imitrex [sumatriptan Succinate] Diarrhea, Nausea And Vomiting   severe   Sumatriptan Other (See Comments)   "tightness in my chest and dizziness"        Medication List        Accurate as of May 10, 2022  3:11 PM. If you have any questions, ask your nurse or doctor.          amitriptyline 25 MG tablet Commonly known as: ELAVIL Take 1 tablet (25 mg total) by mouth at bedtime. For migraine prevention   cyclobenzaprine 5 MG tablet Commonly known as: FLEXERIL Take 1 tablet (5 mg total) by mouth daily as needed (migraines).   DULoxetine 30 MG capsule Commonly known as: CYMBALTA TAKE 1 CAPSULE(30 MG) BY MOUTH DAILY FOR ANXIETY OR DEPRESSION OR PAIN   ibuprofen 400 MG tablet Commonly known as: ADVIL Take 400 mg by mouth every 6 (six) hours as needed.   meloxicam 7.5 MG tablet Commonly known as: MOBIC Take 7.5 mg by mouth 2 (two) times daily.   ondansetron 4 MG disintegrating tablet Commonly known as: ZOFRAN-ODT Take 1 tablet (4 mg total) by mouth every 8 (eight) hours as needed for nausea or vomiting.   rizatriptan 10 MG tablet Commonly known as: MAXALT TAKE 1 TABLET BY MOUTH AT ONSET OF MIGRAINE. MAY REPEAT IN 2  HOURS AS NEEDED   SELENIUM PO Take by mouth daily.   valACYclovir 1000 MG tablet Commonly known as: VALTREX TAKE 2 TABLETS BY MOUTH TWICE DAILY FOR 1 DAY as needed for outbreaks.          REVIEW OF SYSTEMS: A comprehensive ROS was conducted with the patient and is negative except as per HPI    OBJECTIVE:  VS: BP 124/70 (BP Location: Left Arm, Patient Position: Sitting, Cuff Size: Small)   Pulse 95   Ht 5\' 5"  (1.651 m)   Wt 195 lb (88.5 kg)   SpO2 97%   BMI 32.45 kg/m    Wt Readings from Last 3 Encounters:  05/10/22 195 lb (88.5 kg)  05/02/22 198 lb (89.8 kg)  03/24/22 188 lb (85.3 kg)     EXAM: General:  Pt appears well and is in NAD  Eyes: External eye exam normal without stare, lid lag or exophthalmos.  EOM intact.    Neck: General: Supple without adenopathy. Thyroid: Right thyroid asymmetry noted   Lungs: Clear with good BS bilat with no rales, rhonchi, or wheezes  Heart: Auscultation: RRR.  Abdomen: Normoactive bowel sounds, soft, nontender, without masses or organomegaly palpable  Extremities:  BL LE: No pretibial edema normal ROM and strength.  Mental Status: Judgment, insight: Intact Orientation: Oriented to time, place, and person Mood and affect: No depression, anxiety, or agitation     DATA REVIEWED: ***    ASSESSMENT/PLAN/RECOMMENDATIONS:   Hashimoto's Disease:    Medications :  Signed electronically by: 03/26/22, MD  [facility]  [facility] [address] [place], [place] Phone: [phone] FAX: [phone]   CC: [phone], NP [address] [place] Phone: [phone] Fax: [phone]   Return to Endocrinology clinic as below: Future Appointments  Date Time Provider Department Center  05/18/2022  2:30 PM [facility] [place] MRI 1 [facility]  05/24/2022 10:40 AM 05/26/2022, MD [facility] None  08/08/2022  9:40 AM 08/10/2022, NP [facility]

## 2022-05-10 NOTE — Progress Notes (Deleted)
Office Visit Note  Patient: Nichole Mcbride             Date of Birth: 12-14-1985           MRN: [id number]             PCP: Doreene Nest, NP Referring: Doreene Nest, NP Visit Date: 05/24/2022 Occupation: @GUAROCC @  Subjective:  No chief complaint on file.   History of Present Illness: Nichole Mcbride is a 36 y.o. female ***   Activities of Daily Living:  Patient reports morning stiffness for *** {minute/hour:19697}.   Patient {ACTIONS;DENIES/REPORTS:21021675::"Denies"} nocturnal pain.  Difficulty dressing/grooming: {ACTIONS;DENIES/REPORTS:21021675::"Denies"} Difficulty climbing stairs: {ACTIONS;DENIES/REPORTS:21021675::"Denies"} Difficulty getting out of chair: {ACTIONS;DENIES/REPORTS:21021675::"Denies"} Difficulty using hands for taps, buttons, cutlery, and/or writing: {ACTIONS;DENIES/REPORTS:21021675::"Denies"}  No Rheumatology ROS completed.   PMFS History:  Patient Active Problem List   Diagnosis Date Noted  . Dysuria 03/24/2022  . Family history of thyroid disease 07/05/2021  . Joint pain in both hands 02/15/2021  . Generalized body aches 01/25/2021  . Blunt trauma of face 01/25/2021  . Rib pain on right side 04/29/2020  . MDD (major depressive disorder), recurrent episode, moderate (HCC) 08/08/2018  . Prediabetes 01/25/2018  . Supervision of other normal pregnancy, antepartum 07/09/2017  . BMI 31.0-31.9,adult 07/09/2017  . Rash and nonspecific skin eruption 06/15/2016  . 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 Preventative health care 05/04/2016  . Fibromyalgia 03/23/2016  . Anxiety and depression 03/23/2016  . Sjogren's disease (HCC) 03/23/2016  . Migraines 10/14/2011  . Raynaud's disease 09/20/2011  . SCOLIOSIS 06/10/2008    Past Medical History:  Diagnosis Date  . Carpal tunnel syndrome    bilaterally  . DEPRESSION 06/10/2008  . Hashimoto's thyroiditis   . INSOMNIA 06/10/2008  . Migraine headache   . Raynaud phenomenon   . SCOLIOSIS  06/10/2008  . Sjogren's disease (HCC)     Family History  Problem Relation Age of Onset  . Transient ischemic attack Mother        x3  . Diabetes Mother   . Hypertension Mother   . Hypothyroidism Mother   . Depression Mother   . Hypertension Father   . Hypothyroidism Father   . Rheum arthritis Father   . Depression Brother   . Healthy Son   . Healthy Son   . Bipolar disorder Cousin    Past Surgical History:  Procedure Laterality Date  . INTRAUTERINE DEVICE INSERTION  11/05/2009   Social History   Social History Narrative   Married.   2 children.   Teaches English.   Enjoys spending time with family.   Immunization History  Administered Date(s) Administered  . Hepatitis A, Adult 04/25/2012, 08/25/2015  . Hepatitis B 07/11/1999, 09/30/1999, 01/23/2000  . Influenza Whole 11/19/2012  . Moderna Sars-Covid-2 Vaccination 02/26/2020, 03/18/2020  . Td 07/28/2020  . Tdap 04/06/2013     Objective: Vital Signs: There were no vitals taken for this visit.   Physical Exam   Musculoskeletal Exam: ***  CDAI Exam: CDAI Score: -- Patient Global: --; Provider Global: -- Swollen: --; Tender: -- Joint Exam 05/24/2022   No joint exam has been documented for this visit   There is currently no information documented on the homunculus. Go to the Rheumatology activity and complete the homunculus joint exam.  Investigation: No additional findings.  Imaging: No results found.  Recent Labs: Lab Results  Component Value Date   WBC 4.8 01/25/2021   HGB 13.5 01/25/2021   PLT 280.0 01/25/2021   NA  139 01/25/2021   K 4.4 01/25/2021   CL 104 01/25/2021   CO2 28 01/25/2021   GLUCOSE 93 01/25/2021   BUN 12 01/25/2021   CREATININE 0.86 01/25/2021   BILITOT 0.8 01/25/2021   ALKPHOS 53 01/25/2021   AST 16 01/25/2021   ALT 11 01/25/2021   PROT 6.6 01/25/2021   ALBUMIN 4.3 01/25/2021   CALCIUM 9.1 01/25/2021   GFRAA 137 09/20/2011    Speciality Comments: No specialty  comments available.  Procedures:  No procedures performed Allergies: Ciprofloxacin hcl, Imitrex [sumatriptan succinate], and Sumatriptan   Assessment / Plan:     Visit Diagnoses: No diagnosis found.  Orders: No orders of the defined types were placed in this encounter.  No orders of the defined types were placed in this encounter.   Face-to-face time spent with patient was *** minutes. Greater than 50% of time was spent in counseling and coordination of care.  Follow-Up Instructions: No follow-ups on file.   Ellen Henri, CMA  Note - This record has been created using Animal nutritionist.  Chart creation errors have been sought, but may not always  have been located. Such creation errors do not reflect on  the standard of medical care.

## 2022-05-11 ENCOUNTER — Ambulatory Visit
Admission: RE | Admit: 2022-05-11 | Discharge: 2022-05-11 | Disposition: A | Discharge status: Home / Self Care | Payer: Medicaid Other | Source: Ambulatory Visit | Attending: Internal Medicine | Admitting: Internal Medicine

## 2022-05-11 DIAGNOSIS — E063 Autoimmune thyroiditis: Secondary | ICD-10-CM | POA: Diagnosis not present

## 2022-05-11 LAB — TSH: TSH: 1.88 u[IU]/mL (ref 0.35–5.50)

## 2022-05-11 LAB — T4, FREE: Free T4: 0.75 ng/dL (ref 0.60–1.60)

## 2022-05-11 LAB — T3: T3, Total: 112 ng/dL (ref 76–181)

## 2022-05-15 ENCOUNTER — Other Ambulatory Visit: Payer: Self-pay | Admitting: Primary Care

## 2022-05-15 DIAGNOSIS — G43909 Migraine, unspecified, not intractable, without status migrainosus: Secondary | ICD-10-CM

## 2022-05-16 DIAGNOSIS — G43909 Migraine, unspecified, not intractable, without status migrainosus: Secondary | ICD-10-CM

## 2022-05-16 MED ORDER — RIZATRIPTAN BENZOATE 10 MG PO TABS: ORAL_TABLET | ORAL | 0 refills | Status: DC

## 2022-05-18 ENCOUNTER — Ambulatory Visit
Admission: RE | Admit: 2022-05-18 | Discharge: 2022-05-18 | Disposition: A | Discharge status: Home / Self Care | Payer: Medicaid Other | Source: Ambulatory Visit | Attending: Primary Care | Admitting: Primary Care

## 2022-05-18 ENCOUNTER — Ambulatory Visit: Payer: 59 | Admitting: Internal Medicine

## 2022-05-18 DIAGNOSIS — F419 Anxiety disorder, unspecified: Secondary | ICD-10-CM

## 2022-05-18 DIAGNOSIS — R519 Headache, unspecified: Secondary | ICD-10-CM | POA: Diagnosis not present

## 2022-05-18 DIAGNOSIS — G43909 Migraine, unspecified, not intractable, without status migrainosus: Secondary | ICD-10-CM

## 2022-05-20 ENCOUNTER — Other Ambulatory Visit: Payer: Self-pay | Admitting: Primary Care

## 2022-05-20 DIAGNOSIS — G43909 Migraine, unspecified, not intractable, without status migrainosus: Secondary | ICD-10-CM

## 2022-05-24 ENCOUNTER — Ambulatory Visit: Payer: Medicaid Other | Admitting: Rheumatology

## 2022-05-24 DIAGNOSIS — B001 Herpesviral vesicular dermatitis: Secondary | ICD-10-CM

## 2022-05-24 DIAGNOSIS — M542 Cervicalgia: Secondary | ICD-10-CM

## 2022-05-24 DIAGNOSIS — I7300 Raynaud's syndrome without gangrene: Secondary | ICD-10-CM | POA: Diagnosis not present

## 2022-05-24 DIAGNOSIS — R7303 Prediabetes: Secondary | ICD-10-CM

## 2022-05-24 DIAGNOSIS — M3501 Sicca syndrome with keratoconjunctivitis: Secondary | ICD-10-CM | POA: Diagnosis not present

## 2022-05-24 DIAGNOSIS — Z8669 Personal history of other diseases of the nervous system and sense organs: Secondary | ICD-10-CM

## 2022-05-24 DIAGNOSIS — N946 Dysmenorrhea, unspecified: Secondary | ICD-10-CM

## 2022-05-24 DIAGNOSIS — M797 Fibromyalgia: Secondary | ICD-10-CM

## 2022-05-24 DIAGNOSIS — M79641 Pain in right hand: Secondary | ICD-10-CM

## 2022-05-24 DIAGNOSIS — F419 Anxiety disorder, unspecified: Secondary | ICD-10-CM

## 2022-05-24 DIAGNOSIS — M79672 Pain in left foot: Secondary | ICD-10-CM

## 2022-05-24 DIAGNOSIS — D8989 Other specified disorders involving the immune mechanism, not elsewhere classified: Secondary | ICD-10-CM | POA: Diagnosis not present

## 2022-05-24 DIAGNOSIS — Z8719 Personal history of other diseases of the digestive system: Secondary | ICD-10-CM

## 2022-05-26 DIAGNOSIS — Z419 Encounter for procedure for purposes other than remedying health state, unspecified: Secondary | ICD-10-CM | POA: Diagnosis not present
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
## 2022-06-16 ENCOUNTER — Ambulatory Visit: Payer: Medicaid Other | Admitting: Rheumatology

## 2022-06-16 DIAGNOSIS — R7303 Prediabetes: Secondary | ICD-10-CM

## 2022-06-16 DIAGNOSIS — Z8669 Personal history of other diseases of the nervous system and sense organs: Secondary | ICD-10-CM

## 2022-06-16 DIAGNOSIS — I7300 Raynaud's syndrome without gangrene: Secondary | ICD-10-CM

## 2022-06-16 DIAGNOSIS — M79642 Pain in left hand: Secondary | ICD-10-CM

## 2022-06-16 DIAGNOSIS — M3501 Sicca syndrome with keratoconjunctivitis: Secondary | ICD-10-CM

## 2022-06-16 DIAGNOSIS — M542 Cervicalgia: Secondary | ICD-10-CM

## 2022-06-16 DIAGNOSIS — M79671 Pain in right foot: Secondary | ICD-10-CM

## 2022-06-16 DIAGNOSIS — B001 Herpesviral vesicular dermatitis: Secondary | ICD-10-CM

## 2022-06-16 DIAGNOSIS — Z8719 Personal history of other diseases of the digestive system: Secondary | ICD-10-CM

## 2022-06-16 DIAGNOSIS — N946 Dysmenorrhea, unspecified: Secondary | ICD-10-CM

## 2022-06-16 DIAGNOSIS — M797 Fibromyalgia: Secondary | ICD-10-CM

## 2022-06-16 DIAGNOSIS — F32A Depression, unspecified: Secondary | ICD-10-CM
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
## 2022-06-25 DIAGNOSIS — Z419 Encounter for procedure for purposes other than remedying health state, unspecified: Secondary | ICD-10-CM | POA: Diagnosis not present

## 2022-06-28 ENCOUNTER — Ambulatory Visit: Payer: Medicaid Other | Attending: Rheumatology | Admitting: Physician Assistant

## 2022-06-28 DIAGNOSIS — R7303 Prediabetes: Secondary | ICD-10-CM

## 2022-06-28 DIAGNOSIS — Z8719 Personal history of other diseases of the digestive system: Secondary | ICD-10-CM

## 2022-06-28 DIAGNOSIS — N946 Dysmenorrhea, unspecified: Secondary | ICD-10-CM

## 2022-06-28 DIAGNOSIS — M79671 Pain in right foot: Secondary | ICD-10-CM

## 2022-06-28 DIAGNOSIS — M79641 Pain in right hand: Secondary | ICD-10-CM

## 2022-06-28 DIAGNOSIS — M542 Cervicalgia: Secondary | ICD-10-CM

## 2022-06-28 DIAGNOSIS — M3501 Sicca syndrome with keratoconjunctivitis: Secondary | ICD-10-CM

## 2022-06-28 DIAGNOSIS — B001 Herpesviral vesicular dermatitis: Secondary | ICD-10-CM

## 2022-06-28 DIAGNOSIS — Z8669 Personal history of other diseases of the nervous system and sense organs: Secondary | ICD-10-CM

## 2022-06-28 DIAGNOSIS — F419 Anxiety disorder, unspecified: Secondary | ICD-10-CM

## 2022-06-28 DIAGNOSIS — M797 Fibromyalgia: Secondary | ICD-10-CM

## 2022-06-28 DIAGNOSIS — I7300 Raynaud's syndrome without gangrene: Secondary | ICD-10-CM

## 2022-07-06 NOTE — Progress Notes (Signed)
Office Visit Note  Patient: Nichole Mcbride             Date of Birth: [birth year]           MRN: [id number]             PCP: Doreene Nest, NP Referring: Doreene Nest, NP Visit Date: 07/07/2022 Occupation: @GUAROCC @  Subjective:  Discuss AVISE results   History of Present Illness: Nichole Mcbride is a 36 y.o. female with history of sjogren's syndrome and fibromyalgia.  Patient is not currently taking any immunosuppressive agents. She declined the use of pilocarpine in the past. Patient presents today to discuss AVISE results.    Patient reports that overall her symptoms have been stable since her last office visit.  She continues to have chronic sicca symptoms but overall her symptoms have been manageable with the use of over-the-counter products including refresh gel drops at night as well as Biotene products for mouth dryness.  She denies any salivary stones or swollen lymph nodes.  She is due for an updated ophthalmology visit as well as a visit with her dentist.   She continues to experience intermittent pain and stiffness in both hands.  She denies any joint swelling currently.  She denies any other joint pain or inflammation at this time.  She is occasional myalgias and muscle tenderness due to fibromyalgia. She denies any recent rashes.  She has not noticed any increase skin tightness or thickening.  She has intermittent symptoms of Raynaud's in her fingertips and toes.    Activities of Daily Living:  Patient reports morning stiffness for 0 minutes  Patient Denies nocturnal pain.  Difficulty dressing/grooming: Denies Difficulty climbing stairs: Denies Difficulty getting out of chair: Denies Difficulty using hands for taps, buttons, cutlery, and/or writing: Denies  Review of Systems  Constitutional:  Positive for fatigue.  HENT:  Positive for mouth dryness and nose dryness. Negative for mouth sores.   Eyes:  Positive for dryness.  Negative for pain and visual disturbance.  Respiratory:  Negative for cough, hemoptysis, shortness of breath and difficulty breathing.   Cardiovascular:  Negative for chest pain, palpitations, hypertension and swelling in legs/feet.  Gastrointestinal:  Negative for blood in stool, constipation and diarrhea.  Endocrine: Negative for increased urination.  Genitourinary:  Negative for painful urination.  Musculoskeletal:  Positive for joint pain, joint pain, myalgias, muscle tenderness and myalgias. Negative for joint swelling, muscle weakness and morning stiffness.  Skin:  Negative for color change, pallor, rash, hair loss, nodules/bumps, skin tightness, ulcers and sensitivity to sunlight.  Allergic/Immunologic: Negative for susceptible to infections.  Neurological:  Negative for dizziness, numbness, headaches and weakness.  Hematological:  Negative for swollen glands.  Psychiatric/Behavioral:  Negative for depressed mood and sleep disturbance. The patient is not nervous/anxious.     PMFS History:  Patient Active Problem List   Diagnosis Date Noted   Dysuria 03/24/2022   Family history of thyroid disease 07/05/2021   Joint pain in both hands 02/15/2021   Generalized body aches 01/25/2021   Blunt trauma of face 01/25/2021   Rib pain on right side 04/29/2020   MDD (major depressive disorder), recurrent episode, moderate ([facility]) 08/08/2018   Prediabetes 01/25/2018   Supervision of other normal pregnancy, antepartum 07/09/2017   BMI 31.0-31.9,adult 07/09/2017   Rash and nonspecific skin eruption 06/15/2016   Herpes labialis 06/15/2016   Preventative health care 05/04/2016   Fibromyalgia 03/23/2016   Anxiety and depression 03/23/2016   Sjogren's disease ([facility])  03/23/2016   Migraines 10/14/2011   Raynaud's disease 09/20/2011   SCOLIOSIS 06/10/2008    Past Medical History:  Diagnosis Date   Carpal tunnel syndrome    bilaterally   DEPRESSION 06/10/2008   Hashimoto's thyroiditis     INSOMNIA 06/10/2008   Migraine headache    Raynaud phenomenon    SCOLIOSIS 06/10/2008   Sjogren's disease ([facility])     Family History  Problem Relation Age of Onset   Transient ischemic attack Mother        x3   Diabetes Mother    Hypertension Mother    Hypothyroidism Mother    Depression Mother    Hypertension Father    Hypothyroidism Father    Rheum arthritis Father    Depression Brother    Healthy Son    Healthy Son    Bipolar disorder Cousin    Past Surgical History:  Procedure Laterality Date   INTRAUTERINE DEVICE INSERTION  11/05/2009   Social History   Social History Narrative   Married.   2 children.   Teaches English.   Enjoys spending time with family.   Immunization History  Administered Date(s) Administered   Hepatitis A, Adult 04/25/2012, 08/25/2015   Hepatitis B 07/11/1999, 09/30/1999, 01/23/2000   Influenza Whole 11/19/2012   Moderna Sars-Covid-2 Vaccination 02/26/2020, 03/18/2020   Td 07/28/2020   Tdap 04/06/2013     Objective: Vital Signs: BP 129/85 (BP Location: Left Arm, Patient Position: Sitting, Cuff Size: Small)   Pulse 75   Resp 12   Ht 5\' 5"  (1.651 m)   Wt 203 lb 9.6 oz (92.4 kg)   BMI 33.88 kg/m    Physical Exam Vitals and nursing note reviewed.  Constitutional:      Appearance: She is well-developed.  HENT:     Head: Normocephalic and atraumatic.  Eyes:     Conjunctiva/sclera: Conjunctivae normal.  Cardiovascular:     Rate and Rhythm: Normal rate and regular rhythm.     Heart sounds: Normal heart sounds.  Pulmonary:     Effort: Pulmonary effort is normal.     Breath sounds: Normal breath sounds.  Abdominal:     General: Bowel sounds are normal.     Palpations: Abdomen is soft.  Musculoskeletal:     Cervical back: Normal range of motion.  Skin:    General: Skin is warm and dry.     Capillary Refill: Capillary refill takes less than 2 seconds.     Comments: No malar rash No digital ulcerations or signs of gangrene.  No  signs of sclerodactyly noted. Mild mottling of skin noted on the palmar aspect of both hands.  Mild livedo reticularis on bilateral lower extremities.  Neurological:     Mental Status: She is alert and oriented to person, place, and time.  Psychiatric:        Behavior: Behavior normal.      Musculoskeletal Exam: C-spine, thoracic spine, lumbar spine have good range of motion.  Shoulder joints, elbow joints, wrist joints, MCPs, PIPs, DIPs have good range of motion with no synovitis.  Complete fist formation bilaterally.  Hip joints have good range of motion with no groin pain.  Knee joints have good range of motion with no warmth or effusion.  Ankle joints have good range of motion with no tenderness or joint swelling.  CDAI Exam: CDAI Score: -- Patient Global: --; Provider Global: -- Swollen: --; Tender: -- Joint Exam 07/07/2022   No joint exam has been documented for this visit  There is currently no information documented on the homunculus. Go to the Rheumatology activity and complete the homunculus joint exam.  Investigation: No additional findings.  Imaging: No results found.  Recent Labs: Lab Results  Component Value Date   WBC 4.8 01/25/2021   HGB 13.5 01/25/2021   PLT 280.0 01/25/2021   NA 139 01/25/2021   K 4.4 01/25/2021   CL 104 01/25/2021   CO2 28 01/25/2021   GLUCOSE 93 01/25/2021   BUN 12 01/25/2021   CREATININE 0.86 01/25/2021   BILITOT 0.8 01/25/2021   ALKPHOS 53 01/25/2021   AST 16 01/25/2021   ALT 11 01/25/2021   PROT 6.6 01/25/2021   ALBUMIN 4.3 01/25/2021   CALCIUM 9.1 01/25/2021   GFRAA 137 09/20/2011    Speciality Comments: No specialty comments available.  Procedures:  No procedures performed Allergies: Ciprofloxacin hcl, Imitrex [sumatriptan succinate], and Sumatriptan     Assessment / Plan:     Visit Diagnoses: Sjogren's syndrome with keratoconjunctivitis sicca ([place]): History of sicca, fatigue, Raynauds, arthralgias,  photosensitivity, miscarriage x1.  Positive ANA, positive Ro, positive anti-centromere: She was initially diagnosed with Sjogren's syndrome in 2015 while in [place]: Patient continues to have chronic sicca symptoms which have been tolerable overall.  She has been using Biotene products as well as refresh gel lubricating drops over-the-counter for symptomatic relief.  She is not currently taking any immunosuppressive agents.  She declined the use of pilocarpine at her last office visit.  At this time she does not feel that she needs a prescription strength medication since her symptoms have been tolerable.  Discussed the importance of seeing her ophthalmologist on a yearly basis as well as the dentist every 6 months. She has no signs of inflammatory arthritis at this time.  No synovitis was noted on examination today.  She does have ongoing pain in both hands but no inflammation was noted.  She takes meloxicam 7.5 mg 1 tablet twice daily for pain relief.  If her symptoms persist or worsen I would recommend proceeding with an ultrasound of both hands to assess for synovitis. She is not experiencing any shortness of breath or new pulmonary symptoms.  Her lungs were clear to auscultation today.  Discussed the increased risk for developing interstitial lung disease in patients with Sjogren's syndrome.  I also discussed the increased risk for developing lymphoma in patients with Sjogren's syndrome. AVISE 05/24/22 was reviewed today in the office in detail: negative index (-2.3): ANA 1:1280 speckled, Anti-Ro52 IgG positive, anti-centromere protein B positive, TPO+, thyroglobulin IgG positive, anti-phosphatidylserine IgM positive.  All questions were addressed.  Recommend repeating antiphosphatidylserine testing in 3 months. Patient does not require immunosuppressive therapy at this time.  She was advised to notify us if she develops any new or worsening symptoms.  She would like to follow-up in the office in 6 months but  was advised to notify us if she needs to see [place] sooner.  Raynaud's disease without gangrene: She experiences intermittent symptoms of Raynaud's phenomenon especially with colder weather temperatures.  On examination today she had good capillary refill less than 2 seconds.  Some mottling of the skin noted on the palmar aspect of both hands but no cyanosis was noted.  No signs of sclerodactyly were noted.  Conservative treatment options were discussed including the importance of avoiding exposure to cold temperatures.  She will notify us if her symptoms persist or worsen.  Fibromyalgia: History of generalized pain, fatigue, myalgias and arthralgias after pregnancy.  She was diagnosed with  fibromyalgia in 2015 while she was in [place].  She remains on Cymbalta as prescribed.  She takes meloxicam 7.51 tablet twice daily for pain relief.    Other medical conditions are listed as follows:  Prediabetes  Hx of migraines  Dysmenorrhea  Anxiety and depression  History of IBS  Herpes labialis  Orders: No orders of the defined types were placed in this encounter.  No orders of the defined types were placed in this encounter.    Follow-Up Instructions: Return in about 6 months (around 01/06/2023) for Sjogren's syndrome.   Gearldine Bienenstock, PA-C  Note - This record has been created using Dragon software.  Chart creation errors have been sought, but may not always  have been located. Such creation errors do not reflect on  the standard of medical care.

## 2022-07-07 ENCOUNTER — Encounter: Payer: Self-pay | Admitting: *Deleted

## 2022-07-07 ENCOUNTER — Ambulatory Visit: Payer: Medicaid Other | Attending: Rheumatology | Admitting: Physician Assistant

## 2022-07-07 ENCOUNTER — Encounter: Payer: Self-pay | Admitting: Physician Assistant

## 2022-07-07 VITALS — BP 129/85 | HR 75 | Resp 12 | Ht 65.0 in | Wt 203.6 lb

## 2022-07-07 DIAGNOSIS — M3501 Sicca syndrome with keratoconjunctivitis: Secondary | ICD-10-CM | POA: Diagnosis not present

## 2022-07-07 DIAGNOSIS — F32A Depression, unspecified: Secondary | ICD-10-CM | POA: Diagnosis not present

## 2022-07-07 DIAGNOSIS — R7303 Prediabetes: Secondary | ICD-10-CM | POA: Diagnosis not present

## 2022-07-07 DIAGNOSIS — R899 Unspecified abnormal finding in specimens from other organs, systems and tissues: Secondary | ICD-10-CM

## 2022-07-07 DIAGNOSIS — M797 Fibromyalgia: Secondary | ICD-10-CM | POA: Diagnosis not present

## 2022-07-07 DIAGNOSIS — F419 Anxiety disorder, unspecified: Secondary | ICD-10-CM | POA: Diagnosis not present

## 2022-07-07 DIAGNOSIS — Z8669 Personal history of other diseases of the nervous system and sense organs: Secondary | ICD-10-CM | POA: Diagnosis not present

## 2022-07-07 DIAGNOSIS — I7300 Raynaud's syndrome without gangrene: Secondary | ICD-10-CM | POA: Diagnosis not present

## 2022-07-07 DIAGNOSIS — R768 Other specified abnormal immunological findings in serum: Secondary | ICD-10-CM

## 2022-07-07 DIAGNOSIS — N946 Dysmenorrhea, unspecified: Secondary | ICD-10-CM

## 2022-07-07 DIAGNOSIS — Z8719 Personal history of other diseases of the digestive system: Secondary | ICD-10-CM | POA: Diagnosis not present

## 2022-07-07 DIAGNOSIS — B001 Herpesviral vesicular dermatitis: Secondary | ICD-10-CM

## 2022-07-09 ENCOUNTER — Other Ambulatory Visit: Payer: Self-pay | Admitting: Primary Care

## 2022-07-09 DIAGNOSIS — M797 Fibromyalgia: Secondary | ICD-10-CM

## 2022-07-09 DIAGNOSIS — F32A Depression, unspecified: Secondary | ICD-10-CM

## 2022-07-11 ENCOUNTER — Other Ambulatory Visit: Payer: Self-pay | Admitting: Primary Care

## 2022-07-11 DIAGNOSIS — G43909 Migraine, unspecified, not intractable, without status migrainosus: Secondary | ICD-10-CM

## 2022-07-11 MED ORDER — RIZATRIPTAN BENZOATE 10 MG PO TABS: ORAL_TABLET | ORAL | 0 refills | Status: AC

## 2022-07-17 ENCOUNTER — Encounter: Payer: Self-pay | Admitting: Rheumatology

## 2022-07-20 ENCOUNTER — Ambulatory Visit (INDEPENDENT_AMBULATORY_CARE_PROVIDER_SITE_OTHER): Payer: Medicaid Other | Admitting: Primary Care

## 2022-07-20 ENCOUNTER — Encounter: Payer: Self-pay | Admitting: Primary Care

## 2022-07-20 VITALS — BP 128/80 | HR 87 | Temp 98.6°F | Ht 65.0 in | Wt 205.0 lb

## 2022-07-20 DIAGNOSIS — G43909 Migraine, unspecified, not intractable, without status migrainosus: Secondary | ICD-10-CM | POA: Diagnosis not present

## 2022-07-20 DIAGNOSIS — G43009 Migraine without aura, not intractable, without status migrainosus: Secondary | ICD-10-CM

## 2022-07-20 MED ORDER — KETOROLAC TROMETHAMINE 60 MG/2ML IM SOLN
60.0000 mg | Freq: Once | INTRAMUSCULAR | Status: AC
  Administered 2022-07-20: 60 mg via INTRAMUSCULAR

## 2022-07-20 MED ORDER — CYCLOBENZAPRINE HCL 5 MG PO TABS: 5.0000 mg | ORAL_TABLET | Freq: Two times a day (BID) | ORAL | 0 refills | Status: DC | PRN

## 2022-07-20 NOTE — Progress Notes (Signed)
Subjective:    Patient ID: Nichole Mcbride, female    DOB: 1986-03-08, 36 y.o.   MRN: [id number]  Migraine  Associated symptoms include nausea and photophobia.    Nichole Mcbride is a very pleasant 36 y.o. female with a history of migraines, anxiety and depression, fibromyalgia, sjogren's disease who presents today to discuss migraines.  Currently managed on rizatriptan 10 mg PRN for migraine abortion, cyclobenzaprine 5 mg PRN, and amitriptyline 25 mg HS for migraine prevention.   She was last evaluated in August 2023 for follow up of uncontrolled migraines despite management on Topamax 100 mg HS. During her last visit she noticed a reduction overall in migraines, did have two migraines in between. She underwent MRI brain in August 2023 which revealed a potential chronic micro hemorrhage, cavernoma, or focus of mineralization.   Since her last visit she's continued to do very well, infrequent migraines except for around menses, until last week. Last week she experienced a migraine behind her right eye with radiation to right occipital lobe. This lasted for 2-3 days.   Three days ago she developed a headache to the base of her left occipital lobe with radiation behind her left eye and down her left neck. She also developed blurred vision which has since resolved.  She's been taking cyclobenzaprine, rizatriptan, and Meloxicam Today she's feeling some better, but continues to mostly experience a headache and left lateral neck pain.    Review of Systems  Eyes:  Positive for photophobia.  Gastrointestinal:  Positive for nausea.  Musculoskeletal:  Positive for myalgias.  Neurological:  Positive for headaches.         Past Medical History:  Diagnosis Date   Carpal tunnel syndrome    bilaterally   DEPRESSION 06/10/2008   Hashimoto's thyroiditis    INSOMNIA 06/10/2008   Migraine headache    Raynaud phenomenon    SCOLIOSIS 06/10/2008   Sjogren's disease  ([facility])     Social History   Socioeconomic History   Marital status: Divorced    Spouse name: Not on file   Number of children: Not on file   Years of education: Not on file   Highest education level: Not on file  Occupational History   Not on file  Tobacco Use   Smoking status: Former    Types: Cigarettes    Passive exposure: Past   Smokeless tobacco: Never  Vaping Use   Vaping Use: Some days  Substance and Sexual Activity   Alcohol use: Yes    Comment: occ   Drug use: No   Sexual activity: Not Currently    Birth control/protection: None  Other Topics Concern   Not on file  Social History Narrative   Married.   2 children.   Teaches English.   Enjoys spending time with family.   Social Determinants of Health   Financial Resource Strain: Not on file  Food Insecurity: Not on file  Transportation Needs: Not on file  Physical Activity: Not on file  Stress: Not on file  Social Connections: Not on file  Intimate Partner Violence: Not on file    Past Surgical History:  Procedure Laterality Date   INTRAUTERINE DEVICE INSERTION  11/05/2009    Family History  Problem Relation Age of Onset   Transient ischemic attack Mother        x3   Diabetes Mother    Hypertension Mother    Hypothyroidism Mother    Depression Mother    Hypertension Father  Hypothyroidism Father    Rheum arthritis Father    Depression Brother    Healthy Son    Healthy Son    Bipolar disorder Cousin     Allergies  Allergen Reactions   Ciprofloxacin Hcl Other (See Comments)    unknown   Imitrex [Sumatriptan Succinate] Diarrhea and Nausea And Vomiting    severe   Sumatriptan Other (See Comments)    "tightness in my chest and dizziness"    Current Outpatient Medications on File Prior to Visit  Medication Sig Dispense Refill   amitriptyline (ELAVIL) 25 MG tablet Take 1 tablet (25 mg total) by mouth at bedtime. For migraine prevention 90 tablet 1   DULoxetine (CYMBALTA) 30 MG capsule  TAKE 1 CAPSULE(30 MG) BY MOUTH DAILY FOR ANXIETY OR DEPRESSION OR PAIN 90 capsule 0   ibuprofen (ADVIL) 400 MG tablet Take 400 mg by mouth every 6 (six) hours as needed.     meloxicam (MOBIC) 7.5 MG tablet Take 7.5 mg by mouth 2 (two) times daily.     ondansetron (ZOFRAN-ODT) 4 MG disintegrating tablet Take 1 tablet (4 mg total) by mouth every 8 (eight) hours as needed for nausea or vomiting. 20 tablet 0   rizatriptan (MAXALT) 10 MG tablet Take 1 tablet by mouth at migraine onset, May repeat in 2 hours if needed 10 tablet 0   SELENIUM PO Take by mouth daily.     valACYclovir (VALTREX) 1000 MG tablet TAKE 2 TABLETS BY MOUTH TWICE DAILY FOR 1 DAY as needed for outbreaks. 12 tablet 0   No current facility-administered medications on file prior to visit.    BP 128/80   Pulse 87   Temp 98.6 F (37 C) (Temporal)   Ht 5\' 5"  (1.651 m)   Wt 205 lb (93 kg)   SpO2 98%   BMI 34.11 kg/m  Objective:   Physical Exam Neck:     Comments:   Cardiovascular:     Rate and Rhythm: Normal rate and regular rhythm.  Pulmonary:     Effort: Pulmonary effort is normal.     Breath sounds: Normal breath sounds.  Musculoskeletal:     Cervical back: Neck supple. Pain with movement present. No spinous process tenderness. Decreased range of motion.  Skin:    General: Skin is warm and dry.           Assessment & Plan:   Problem List Items Addressed This Visit       Cardiovascular and Mediastinum   Migraines - Primary    Deteriorating.  Will give Toradol 60 mg IM in office for relief.   Will increase cyclobenzaprine 5 mg to 10 mg at bedtime or 5 mg BID.   Continue amitriptyline 25 mg HS.  Continue Zofran 4 mg PRN and Maxalt 10 mg PRN.  Continue Meloxicam 15 mg PRN.   MRI results reviewed and discussed with patient. Will repeat MRI in February 2024.  Despite many treatment plans, continues to have migraines. Will refer to neurology.   I evaluated patient, was consulted regarding treatment, and  agree with assessment and plan per Tinnie Gens, RN, DNP student.   Allie Bossier, NP-C        Relevant Medications   cyclobenzaprine (FLEXERIL) 5 MG tablet   Other Relevant Orders   Ambulatory referral to Neurology       Pleas Koch, NP

## 2022-07-20 NOTE — Patient Instructions (Signed)
Ok to take Flexeril 5 mg twice a day or 10 mg at bedtime.   Referral for neurology has been placed and you should hear from someone in two weeks.   It was a pleasure to see you today!

## 2022-07-20 NOTE — Progress Notes (Signed)
Established Patient Office Visit  Subjective   Patient ID: Nichole Mcbride, female    DOB: 07/02/1986  Age: 36 y.o. MRN: [id number]  Chief Complaint  Patient presents with   Migraine    Has had consistent headache/migraine since 10/16    Migraine  Associated symptoms include dizziness, neck pain, photophobia and tingling. Pertinent negatives include no blurred vision, nausea or weakness.    Nichole Mcbride is a 36 year old female with past medical history of Raynaud's disease, migraines, prediabetes, fibromyalgia, anxiety and depression, Sjogren's disease who presents today for follow-up of migraines.   She was last evaluated in August, 2023 for uncontrolled migraines. She is currently taking amitriptyline 25 mg at bedtime, cyclobenzaprine 5 mg as needed and rizatriptan 10 mg as needed for migraines.   Since, she has seen an improvement and was down to 1-2 migraines a month after starting amitriptyline. However, last week, she woke up with pain behind the right eye radiating to the back of her head for 2-3 days. She took her daily medications and PRN maxalt which gave her relief.  Monday this week, she woke up with throbbing, burning and tingling pain on the base of her skull on the left side. Pain is radiating to the front of her head and down to her neck. Pain is worse with movement. She has tenderness to touch on her head. She had blurred vision on Monday but now has resolved. She took cyclobenzaprine and rizatriptan that morning and did not get any relief. She had some meloxicam that was prescribed by ortho in the past; she took that and got relief. She has intermittent nausea for which she took zofran and was able to get relief.  She has a headache and neck pain/stiffness today but no blurred vision. Her last dose of Maxalt was on Tuesday. Her last dose of meloxicam was last night.   She had a MRI of the brain on 05/18/22 which revealed 5 mm focus of  susceptibility-weight signal loss within the superior left cerebellar hemisphere. This may reflect a chronic  micro hemorrhage, cavernoma or focus of mineralization. Repeat MRI in 6 months was recommended.   Denies any extra stress or any dietary changes.  Patient Active Problem List   Diagnosis Date Noted   Dysuria 03/24/2022   Family history of thyroid disease 07/05/2021   Joint pain in both hands 02/15/2021   Generalized body aches 01/25/2021   Blunt trauma of face 01/25/2021   Rib pain on right side 04/29/2020   MDD (major depressive disorder), recurrent episode, moderate ([place]) 08/08/2018   Prediabetes 01/25/2018   Supervision of other normal pregnancy, antepartum 07/09/2017   BMI 31.0-31.9,adult 07/09/2017   Rash and nonspecific skin eruption 06/15/2016   Herpes labialis 06/15/2016   Preventative health care 05/04/2016   Fibromyalgia 03/23/2016   Anxiety and depression 03/23/2016   Sjogren's disease ([place]) 03/23/2016   Migraines 10/14/2011   Raynaud's disease 09/20/2011   SCOLIOSIS 06/10/2008   Past Medical History:  Diagnosis Date   Carpal tunnel syndrome    bilaterally   DEPRESSION 06/10/2008   Hashimoto's thyroiditis    INSOMNIA 06/10/2008   Migraine headache    Raynaud phenomenon    SCOLIOSIS 06/10/2008   Sjogren's disease ([place])    Past Surgical History:  Procedure Laterality Date   INTRAUTERINE DEVICE INSERTION  11/05/2009   Social History   Tobacco Use   Smoking status: Former    Types: Cigarettes    Passive exposure: Past  Smokeless tobacco: Never  Vaping Use   Vaping Use: Some days  Substance Use Topics   Alcohol use: Yes    Comment: occ   Drug use: No   Family History  Problem Relation Age of Onset   Transient ischemic attack Mother        x3   Diabetes Mother    Hypertension Mother    Hypothyroidism Mother    Depression Mother    Hypertension Father    Hypothyroidism Father    Rheum arthritis Father    Depression Brother    Healthy  Son    Healthy Son    Bipolar disorder Cousin    Allergies  Allergen Reactions   Ciprofloxacin Hcl Other (See Comments)    unknown   Imitrex [Sumatriptan Succinate] Diarrhea and Nausea And Vomiting    severe   Sumatriptan Other (See Comments)    "tightness in my chest and dizziness"      Review of Systems  Eyes:  Positive for photophobia. Negative for blurred vision.  Respiratory:  Negative for shortness of breath.   Cardiovascular:  Negative for chest pain.  Gastrointestinal:  Negative for nausea.  Musculoskeletal:  Positive for neck pain.  Neurological:  Positive for dizziness, tingling and headaches. Negative for weakness.  Psychiatric/Behavioral:  Negative for depression. The patient is not nervous/anxious.       Objective:     BP 128/80   Pulse 87   Temp 98.6 F (37 C) (Temporal)   Ht 5\' 5"  (1.651 m)   Wt 205 lb (93 kg)   SpO2 98%   BMI 34.11 kg/m  BP Readings from Last 3 Encounters:  07/20/22 128/80  07/07/22 129/85  05/10/22 124/70   Wt Readings from Last 3 Encounters:  07/20/22 205 lb (93 kg)  07/07/22 203 lb 9.6 oz (92.4 kg)  05/10/22 195 lb (88.5 kg)      Physical Exam Vitals reviewed.  Neck:     Comments: Decreased ROM in neck on left side.  Cardiovascular:     Rate and Rhythm: Normal rate and regular rhythm.     Pulses: Normal pulses.     Heart sounds: Normal heart sounds.  Pulmonary:     Effort: Pulmonary effort is normal.     Breath sounds: Normal breath sounds.  Musculoskeletal:     Cervical back: Pain with movement present. Decreased range of motion.  Neurological:     Mental Status: She is oriented to person, place, and time.  Psychiatric:        Mood and Affect: Mood normal.        Behavior: Behavior normal.      No results found for any visits on 07/20/22.     The ASCVD Risk score (Arnett DK, et al., 2019) failed to calculate for the following reasons:   The 2019 ASCVD risk score is only valid for ages 95 to 33     Assessment & Plan:   Problem List Items Addressed This Visit       Cardiovascular and Mediastinum   Migraines - Primary    Deteriorating.  Will give Toradol 60 mg IM in office for relief.   Will increase cyclobenzaprine 5 mg to 10 mg at bedtime.   Continue amitriptyline 25 mg HS.  Continue Zofran 4 mg PRN and Maxalt 10 mg PRN.  Continue Meloxicam 15 mg PRN.   MRI results reviewed and discussed with patient. Will repeat MRI in 6 months.  Despite many treatment plans, continues to  have migraines. Will refer to neurology.        Relevant Medications   cyclobenzaprine (FLEXERIL) 5 MG tablet   Other Relevant Orders   Ambulatory referral to Neurology    No follow-ups on file.    Tinnie Gens, BSN-RN, DNP STUDENT

## 2022-07-20 NOTE — Assessment & Plan Note (Addendum)
Deteriorating.  Will give Toradol 60 mg IM in office for relief.   Will increase cyclobenzaprine 5 mg to 10 mg at bedtime or 5 mg BID.   Continue amitriptyline 25 mg HS.  Continue Zofran 4 mg PRN and Maxalt 10 mg PRN.  Continue Meloxicam 15 mg PRN.   MRI results reviewed and discussed with patient. Will repeat MRI in February 2024.  Despite many treatment plans, continues to have migraines. Will refer to neurology.   I evaluated patient, was consulted regarding treatment, and agree with assessment and plan per Tinnie Gens, RN, DNP student.   Allie Bossier, NP-C

## 2022-07-23 DIAGNOSIS — G43909 Migraine, unspecified, not intractable, without status migrainosus: Secondary | ICD-10-CM

## 2022-07-24 ENCOUNTER — Other Ambulatory Visit: Payer: Self-pay | Admitting: Primary Care

## 2022-07-24 DIAGNOSIS — B001 Herpesviral vesicular dermatitis: Secondary | ICD-10-CM

## 2022-07-24 NOTE — Telephone Encounter (Signed)
Called and spoke with patient regarding referral to neurology. I informed her that the referral coordinator sent a letter stating the referral had been approved and she could call and set up an appointment anytime. She had not contacted the office to schedule an appointment yet. I gave contact info for [facility] where the referral was sent. Advised to call and set up an appointment as soon as possible.  Informed patient I would send MyChart message to [facility] for further recommendations.

## 2022-07-25 MED ORDER — PREDNISONE 20 MG PO TABS: ORAL_TABLET | ORAL | 0 refills | Status: DC

## 2022-07-26 DIAGNOSIS — Z419 Encounter for procedure for purposes other than remedying health state, unspecified: Secondary | ICD-10-CM | POA: Diagnosis not present

## 2022-07-28 NOTE — Telephone Encounter (Signed)
MRI Brain completed 05/18/2022

## 2022-07-31 DIAGNOSIS — E063 Autoimmune thyroiditis: Secondary | ICD-10-CM | POA: Diagnosis not present

## 2022-07-31 DIAGNOSIS — Z8659 Personal history of other mental and behavioral disorders: Secondary | ICD-10-CM | POA: Diagnosis not present

## 2022-07-31 DIAGNOSIS — F419 Anxiety disorder, unspecified: Secondary | ICD-10-CM | POA: Diagnosis not present

## 2022-07-31 DIAGNOSIS — M3500 Sicca syndrome, unspecified: Secondary | ICD-10-CM | POA: Diagnosis not present

## 2022-07-31 DIAGNOSIS — E559 Vitamin D deficiency, unspecified: Secondary | ICD-10-CM | POA: Diagnosis not present

## 2022-07-31 DIAGNOSIS — M797 Fibromyalgia: Secondary | ICD-10-CM | POA: Diagnosis not present

## 2022-07-31 DIAGNOSIS — G43711 Chronic migraine without aura, intractable, with status migrainosus: Secondary | ICD-10-CM | POA: Diagnosis not present

## 2022-08-01 DIAGNOSIS — R519 Headache, unspecified: Secondary | ICD-10-CM | POA: Diagnosis not present

## 2022-08-01 DIAGNOSIS — M436 Torticollis: Secondary | ICD-10-CM | POA: Diagnosis not present

## 2022-08-01 DIAGNOSIS — M542 Cervicalgia: Secondary | ICD-10-CM | POA: Diagnosis not present

## 2022-08-01 DIAGNOSIS — D72829 Elevated white blood cell count, unspecified: Secondary | ICD-10-CM | POA: Diagnosis not present

## 2022-08-03 DIAGNOSIS — G43711 Chronic migraine without aura, intractable, with status migrainosus: Secondary | ICD-10-CM | POA: Diagnosis not present

## 2022-08-05 ENCOUNTER — Other Ambulatory Visit: Payer: Self-pay | Admitting: Primary Care

## 2022-08-05 DIAGNOSIS — G43909 Migraine, unspecified, not intractable, without status migrainosus: Secondary | ICD-10-CM
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
## 2022-08-07 DIAGNOSIS — S161XXA Strain of muscle, fascia and tendon at neck level, initial encounter: Secondary | ICD-10-CM | POA: Diagnosis not present

## 2022-08-07 DIAGNOSIS — M542 Cervicalgia: Secondary | ICD-10-CM | POA: Diagnosis not present

## 2022-08-07 DIAGNOSIS — M797 Fibromyalgia: Secondary | ICD-10-CM | POA: Diagnosis not present

## 2022-08-08 ENCOUNTER — Encounter: Payer: Self-pay | Admitting: Primary Care

## 2022-08-21 DIAGNOSIS — M5412 Radiculopathy, cervical region: Secondary | ICD-10-CM | POA: Diagnosis not present

## 2022-08-24 ENCOUNTER — Ambulatory Visit (INDEPENDENT_AMBULATORY_CARE_PROVIDER_SITE_OTHER): Payer: Medicaid Other | Admitting: Primary Care

## 2022-08-24 ENCOUNTER — Other Ambulatory Visit: Payer: Self-pay | Admitting: Primary Care

## 2022-08-24 ENCOUNTER — Encounter: Payer: Self-pay | Admitting: Primary Care

## 2022-08-24 VITALS — BP 122/70 | HR 79 | Temp 98.1°F | Ht 65.0 in | Wt 220.0 lb

## 2022-08-24 DIAGNOSIS — R21 Rash and other nonspecific skin eruption: Secondary | ICD-10-CM

## 2022-08-24 DIAGNOSIS — F419 Anxiety disorder, unspecified: Secondary | ICD-10-CM | POA: Diagnosis not present

## 2022-08-24 DIAGNOSIS — F331 Major depressive disorder, recurrent, moderate: Secondary | ICD-10-CM | POA: Diagnosis not present

## 2022-08-24 DIAGNOSIS — M797 Fibromyalgia: Secondary | ICD-10-CM | POA: Diagnosis not present

## 2022-08-24 DIAGNOSIS — F32A Depression, unspecified: Secondary | ICD-10-CM

## 2022-08-24 DIAGNOSIS — Z0000 Encounter for general adult medical examination without abnormal findings: Secondary | ICD-10-CM | POA: Diagnosis not present

## 2022-08-24 DIAGNOSIS — G43909 Migraine, unspecified, not intractable, without status migrainosus: Secondary | ICD-10-CM | POA: Diagnosis not present

## 2022-08-24 DIAGNOSIS — R7303 Prediabetes: Secondary | ICD-10-CM | POA: Diagnosis not present

## 2022-08-24 DIAGNOSIS — M3500 Sicca syndrome, unspecified: Secondary | ICD-10-CM

## 2022-08-24 DIAGNOSIS — B001 Herpesviral vesicular dermatitis: Secondary | ICD-10-CM | POA: Diagnosis not present

## 2022-08-24 NOTE — Assessment & Plan Note (Signed)
Noted mild case on exam today. Question if this is a reaction from her Aimovig injection a few weeks ago. She will monitor and update if rash returns.
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
## 2022-08-24 NOTE — Assessment & Plan Note (Signed)
Following with rheumatology. Reviewed office notes from February 2023.

## 2022-08-24 NOTE — Assessment & Plan Note (Signed)
Controlled.  Continue duloxetine 30 mg daily.

## 2022-08-24 NOTE — Assessment & Plan Note (Signed)
Glucose reading from neurology from a few weeks ago of 86.   Will adding A1C given weight gain.

## 2022-08-24 NOTE — Assessment & Plan Note (Signed)
Immunizations UTD. Declines influenza vaccine. Pap smear UTD  Discussed the importance of a healthy diet and regular exercise in order for weight loss, and to reduce the risk of further co-morbidity.  Exam stable. Labs pending.  Follow up in 1 year for repeat physical.  

## 2022-08-24 NOTE — Assessment & Plan Note (Signed)
No recent outbreaks.  Continue Valtrex 1000 mg PRN 

## 2022-08-24 NOTE — Assessment & Plan Note (Addendum)
Improved! Following with neurology.  Continue amitriptyline 50 mg HS.  Continue Aimovig every 30 days.  Continue cyclobenzaprine 5 mg PRN. Continue Zofran 4 mg PRN.  Continue Maxalt 10 mg PRN.  Repeat MR Brain in February 2024.

## 2022-08-24 NOTE — Assessment & Plan Note (Signed)
Following with rheumatology.  Continue duloxetine 30 mg daily.

## 2022-08-24 NOTE — Assessment & Plan Note (Signed)
Controlled.  Continue duloxetine 30 mg daily.  

## 2022-08-25 DIAGNOSIS — Z419 Encounter for procedure for purposes other than remedying health state, unspecified: Secondary | ICD-10-CM | POA: Diagnosis not present

## 2022-09-05 ENCOUNTER — Other Ambulatory Visit (INDEPENDENT_AMBULATORY_CARE_PROVIDER_SITE_OTHER): Payer: Medicaid Other

## 2022-09-05 DIAGNOSIS — G43711 Chronic migraine without aura, intractable, with status migrainosus: Secondary | ICD-10-CM | POA: Diagnosis not present

## 2022-09-05 DIAGNOSIS — F32A Depression, unspecified: Secondary | ICD-10-CM | POA: Diagnosis not present

## 2022-09-05 DIAGNOSIS — M797 Fibromyalgia: Secondary | ICD-10-CM | POA: Diagnosis not present

## 2022-09-05 DIAGNOSIS — R7303 Prediabetes: Secondary | ICD-10-CM | POA: Diagnosis not present

## 2022-09-05 DIAGNOSIS — E063 Autoimmune thyroiditis: Secondary | ICD-10-CM | POA: Diagnosis not present

## 2022-09-05 DIAGNOSIS — F419 Anxiety disorder, unspecified: Secondary | ICD-10-CM | POA: Diagnosis not present

## 2022-09-05 DIAGNOSIS — M3500 Sicca syndrome, unspecified: Secondary | ICD-10-CM | POA: Diagnosis not present

## 2022-09-05 LAB — POCT GLYCOSYLATED HEMOGLOBIN (HGB A1C): Hemoglobin A1C: 5.5 % (ref 4.0–5.6)

## 2022-09-14 ENCOUNTER — Ambulatory Visit (INDEPENDENT_AMBULATORY_CARE_PROVIDER_SITE_OTHER): Payer: Medicaid Other | Admitting: Primary Care

## 2022-09-14 ENCOUNTER — Encounter: Payer: Self-pay | Admitting: Primary Care

## 2022-09-14 VITALS — BP 136/88 | HR 70 | Temp 97.3°F | Ht 65.0 in | Wt 225.0 lb

## 2022-09-14 DIAGNOSIS — N939 Abnormal uterine and vaginal bleeding, unspecified: Secondary | ICD-10-CM

## 2022-09-14 DIAGNOSIS — E6609 Other obesity due to excess calories: Secondary | ICD-10-CM | POA: Diagnosis not present

## 2022-09-14 DIAGNOSIS — Z6837 Body mass index (BMI) 37.0-37.9, adult: Secondary | ICD-10-CM | POA: Diagnosis not present

## 2022-09-14 HISTORY — DX: Abnormal uterine and vaginal bleeding, unspecified: N93.9

## 2022-09-14 NOTE — Assessment & Plan Note (Signed)
Once incidence this month, no prior incidence.  Reviewed labs from August and November 2023 which are grossly normal. She is not pregnant.   Recommended she monitor cycle and report if irregularity occurs again this month/early next month. If so then we will proceed with pelvic/transvaginal US.  She will also schedule with GYN.

## 2022-09-14 NOTE — Assessment & Plan Note (Addendum)
Long discussion today about diet and exercise.  Recommended to increase physical activity. Recommended to cut out sugars and cut back on starchy food (rice/potatoes). Recommended she check out weight watchers or My Fitness Pal.  Reviewed labs from August and November 2023.  From chart review of her weight, she has fluctuated from 170's to 200 pounds since 2017. Discussed this today.
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
## 2022-09-14 NOTE — Patient Instructions (Signed)
Please update me regarding your next period as discussed.  Consider weight watchers or calorie counting. Reduce starchy food such as potatoes, rice, pasta.   It was a pleasure to see you today!

## 2022-09-25 DIAGNOSIS — Z419 Encounter for procedure for purposes other than remedying health state, unspecified: Secondary | ICD-10-CM | POA: Diagnosis not present

## 2022-09-26 ENCOUNTER — Other Ambulatory Visit: Payer: Self-pay | Admitting: Primary Care

## 2022-09-26 DIAGNOSIS — G43909 Migraine, unspecified, not intractable, without status migrainosus: Secondary | ICD-10-CM

## 2022-10-03 ENCOUNTER — Other Ambulatory Visit (HOSPITAL_COMMUNITY): Payer: Self-pay

## 2022-10-05 ENCOUNTER — Encounter: Admitting: Nurse Practitioner

## 2022-10-08 ENCOUNTER — Other Ambulatory Visit: Payer: Self-pay | Admitting: Primary Care

## 2022-10-08 DIAGNOSIS — M797 Fibromyalgia: Secondary | ICD-10-CM

## 2022-10-08 DIAGNOSIS — F32A Depression, unspecified: Secondary | ICD-10-CM

## 2022-10-10 ENCOUNTER — Other Ambulatory Visit: Payer: Self-pay | Admitting: Primary Care

## 2022-10-10 DIAGNOSIS — B001 Herpesviral vesicular dermatitis: Secondary | ICD-10-CM

## 2022-10-11 ENCOUNTER — Ambulatory Visit (INDEPENDENT_AMBULATORY_CARE_PROVIDER_SITE_OTHER): Payer: Medicaid Other | Admitting: Nurse Practitioner

## 2022-10-11 ENCOUNTER — Encounter: Payer: Self-pay | Admitting: Nurse Practitioner

## 2022-10-11 VITALS — BP 116/74 | HR 105

## 2022-10-11 DIAGNOSIS — N926 Irregular menstruation, unspecified: Secondary | ICD-10-CM | POA: Diagnosis not present

## 2022-10-11 DIAGNOSIS — Z113 Encounter for screening for infections with a predominantly sexual mode of transmission: Secondary | ICD-10-CM

## 2022-10-11 LAB — PREGNANCY, URINE: Preg Test, Ur: NEGATIVE

## 2022-10-11 NOTE — Progress Notes (Signed)
   Acute Office Visit  Subjective:    Patient ID: Nichole Mcbride, female    DOB: 05-07-1986, 37 y.o.   MRN: [id number]   HPI 37 y.o. presents today for irregular bleeding x 3 months. Has breakthrough bleeding mid cycle that starts as spotting but then becomes heavy like her menses. Normal cycles prior. No bleeding with intercourse. Sexually active, condoms some. Had IUD in the past. UTD on pap 07/2020, normal pap history. Also reports 30 pound weight gain since November with no changes in diet or exercise, only med change was adding Aimovig. Does have intermittent vaginal itching, no discharge or odor. H/O migraines without aura, Hashimoto's (managed by endocrinology, normal levels in August, normal U/S, no treatment), Sjogren's (managed by rheumatology, not on treatment), anxiety and depression.   Menstrual history: LMP 09/24/2022 Period Duration (Days): 5 Period Pattern: (!) Irregular Menstrual Flow: Moderate, Heavy Menstrual Control: Maxi pad (menstrual cup) Dysmenorrhea: (!) Mild Dysmenorrhea Symptoms: Cramping, Nausea, Diarrhea, Headache (mild to moderate cramping)   Review of Systems  Constitutional:  Positive for unexpected weight change.  Genitourinary:  Positive for menstrual problem.       Objective:    Physical Exam Constitutional:      Appearance: Normal appearance. She is obese.  Genitourinary:    General: Normal vulva.     Vagina: Normal.     Cervix: Normal.     Uterus: Normal.      Adnexa: Right adnexa normal and left adnexa normal.     BP 116/74   Pulse (!) 105   LMP 09/24/2022 (Exact Date)   SpO2 98%  Wt Readings from Last 3 Encounters:  09/14/22 225 lb (102.1 kg)  08/24/22 220 lb (99.8 kg)  07/20/22 205 lb (93 kg)        Patient informed chaperone available to be present for breast and/or pelvic exam. Patient has requested no chaperone to be present. Patient has been advised what will be completed during breast and pelvic exam.   UPT  negative  Assessment & Plan:   Problem List Items Addressed This Visit   None Visit Diagnoses     Irregular bleeding    -  Primary   Relevant Orders   Pregnancy, urine (Completed)   Thyroid Panel With TSH   Screen for STD (sexually transmitted disease)       Relevant Orders   SureSwab Advanced Vaginitis Plus,TMA   RPR   HIV Antibody (routine testing w rflx)      Plan: Thyroid panel and vaginitis/STD panel pending. UPT negative. If normal we discussed option for ultrasound or starting hormonal contraception for management.      Tamela Gammon DNP, 10:03 AM 10/11/2022

## 2022-10-12 LAB — THYROID PANEL WITH TSH
Free Thyroxine Index: 1.7 (ref 1.4–3.8)
T3 Uptake: 24 % (ref 22–35)
T4, Total: 7.1 ug/dL (ref 5.1–11.9)
TSH: 3.04 mIU/L

## 2022-10-12 LAB — HIV ANTIBODY (ROUTINE TESTING W REFLEX): HIV 1&2 Ab, 4th Generation: NONREACTIVE

## 2022-10-12 LAB — RPR: RPR Ser Ql: NONREACTIVE

## 2022-10-12 LAB — SURESWAB® ADVANCED VAGINITIS PLUS,TMA
C. trachomatis RNA, TMA: NOT DETECTED
CANDIDA SPECIES: NOT DETECTED
Candida glabrata: NOT DETECTED
N. gonorrhoeae RNA, TMA: NOT DETECTED
SURESWAB(R) ADV BACTERIAL VAGINOSIS(BV),TMA: POSITIVE — AB
TRICHOMONAS VAGINALIS (TV),TMA: NOT DETECTED

## 2022-10-13 ENCOUNTER — Other Ambulatory Visit: Payer: Self-pay | Admitting: *Deleted

## 2022-10-13 MED ORDER — METRONIDAZOLE 500 MG PO TABS: 500.0000 mg | ORAL_TABLET | Freq: Two times a day (BID) | ORAL | 0 refills | Status: DC

## 2022-10-16 ENCOUNTER — Other Ambulatory Visit (HOSPITAL_COMMUNITY): Payer: Self-pay

## 2022-10-16 ENCOUNTER — Telehealth: Payer: Self-pay

## 2022-10-16 NOTE — Telephone Encounter (Signed)
Patient notified

## 2022-10-16 NOTE — Telephone Encounter (Signed)
Pharmacy Patient Advocate Encounter   Received notification from [facility] that prior authorization for Ondansetron 4mg  ODT is required/requested.  Per Test Claim: claim was approved   Key [id number] Status is Approved

## 2022-10-19 ENCOUNTER — Ambulatory Visit: Payer: Medicaid Other | Admitting: Primary Care

## 2022-10-25 ENCOUNTER — Ambulatory Visit: Payer: Medicaid Other | Admitting: Nurse Practitioner

## 2022-10-26 ENCOUNTER — Other Ambulatory Visit: Payer: Self-pay | Admitting: Primary Care

## 2022-10-26 DIAGNOSIS — G43909 Migraine, unspecified, not intractable, without status migrainosus: Secondary | ICD-10-CM

## 2022-10-26 DIAGNOSIS — Z419 Encounter for procedure for purposes other than remedying health state, unspecified: Secondary | ICD-10-CM | POA: Diagnosis not present

## 2022-11-02 ENCOUNTER — Encounter: Payer: Self-pay | Admitting: Primary Care

## 2022-11-02 ENCOUNTER — Ambulatory Visit (INDEPENDENT_AMBULATORY_CARE_PROVIDER_SITE_OTHER): Payer: Medicaid Other | Admitting: Primary Care

## 2022-11-02 VITALS — BP 128/86 | HR 80 | Temp 98.2°F | Ht 65.0 in | Wt 225.0 lb

## 2022-11-02 DIAGNOSIS — R9089 Other abnormal findings on diagnostic imaging of central nervous system: Secondary | ICD-10-CM | POA: Diagnosis not present

## 2022-11-02 DIAGNOSIS — G43909 Migraine, unspecified, not intractable, without status migrainosus: Secondary | ICD-10-CM

## 2022-11-02 DIAGNOSIS — F32A Depression, unspecified: Secondary | ICD-10-CM | POA: Diagnosis not present

## 2022-11-02 DIAGNOSIS — F419 Anxiety disorder, unspecified: Secondary | ICD-10-CM | POA: Diagnosis not present

## 2022-11-02 DIAGNOSIS — M7989 Other specified soft tissue disorders: Secondary | ICD-10-CM

## 2022-11-02 NOTE — Progress Notes (Signed)
Subjective:    Patient ID: Nichole Mcbride, female    DOB: 05-01-1986, 37 y.o.   MRN: [id number]  Depression         Nichole Mcbride is a very pleasant 37 y.o. female with a history of fibromyalgia, depression, anxiety, migraines, sjogren's disease, joint pain who presents today to discuss multiple concerns.  She is due for repeat MRI brain from abnormal MRI in August 2023.  1) Lower Extremity Swelling: Acute for the last 6-7 weeks, noticed around Christmas. Her swelling is noticed to the bilateral thighs, shins, and feet. Her swelling is intermittent. Sometimes she notices upon waking, sometimes at the end of the day. She's noticed pitting edema at times. She has a picture of her edema today. She does notice improvement in swelling with warm bath and elevation of her legs.   She denies dietary changes. She's improved her diet by cutting back on starches and carbs. She does not adding salt to food. She denies significant increase of water.   2) Depression/Anxiety: Currently managed on Cymbalta 30 mg daily for which she has been taking for years. Also managed on amitriptyline 25 mg HS for migraine prevention.   Chronic and intermittent. Slightly worse over the last year, some weeks are worse than others. She began seeing a therapist weekly since October 2023 which has helped.   Diagnosed with ADHD as a child, once managed on Ritalin and then Adderall. She stopped her medications in high school as she was made fun of for taking her medications. Symptoms include feeling anxious, impulsiveness, mood swings, will be energetic for several days followed by "crashing" for several days.     Review of Systems  Respiratory:  Negative for shortness of breath.   Cardiovascular:  Positive for leg swelling. Negative for chest pain.  Psychiatric/Behavioral:  Positive for depression. The patient is nervous/anxious.        See HPI         Past Medical History:  Diagnosis Date    Anxiety    Blunt trauma of face 01/25/2021   Carpal tunnel syndrome    bilaterally   DEPRESSION 06/10/2008   Hashimoto's thyroiditis    INSOMNIA 06/10/2008   Migraine headache    Raynaud phenomenon    Rib pain on right side 04/29/2020   SCOLIOSIS 06/10/2008   Sjogren's disease ([place])    STD (sexually transmitted disease)    hsv oral   Supervision of other normal pregnancy, antepartum 07/09/2017   Clinic  [facility]  Prenatal Labs  Dating     Blood type:     Genetic Screen  1 Screen:    AFP:     Quad:     NIPS:  Antibody:   Anatomic [place]     Rubella:   Varicella:    GTT  Early:               Third trimester:   RPR:     Rhogam     HBsAg:     TDaP vaccine                        Flu Shot:  HIV:     Baby Food                                  GBS:   Contraception     Pap:  CBB  CS/VBAC          Social History   Socioeconomic History   Marital status: Divorced    Spouse name: Not on file   Number of children: Not on file   Years of education: Not on file   Highest education level: Not on file  Occupational History   Not on file  Tobacco Use   Smoking status: Former    Types: Cigarettes    Passive exposure: Past   Smokeless tobacco: Never  Vaping Use   Vaping Use: Some days  Substance and Sexual Activity   Alcohol use: Not Currently   Drug use: Never   Sexual activity: Yes    Partners: Male    Birth control/protection: Condom    Comment: condoms occ  Other Topics Concern   Not on file  Social History Narrative   Married.   2 children.   Teaches English.   Enjoys spending time with family.   Social Determinants of Health   Financial Resource Strain: Not on file  Food Insecurity: Not on file  Transportation Needs: Not on file  Physical Activity: Not on file  Stress: Not on file  Social Connections: Not on file  Intimate Partner Violence: Not on file    Past Surgical History:  Procedure Laterality Date   INTRAUTERINE DEVICE INSERTION  11/05/2009    Family  History  Problem Relation Age of Onset   Thyroid disease Mother    Transient ischemic attack Mother        x3   Diabetes Mother    Hypertension Mother    Hypothyroidism Mother    Depression Mother    Thyroid disease Father    Hypertension Father    Hypothyroidism Father    Rheum arthritis Father    Depression Brother    Cancer Paternal Grandfather    Bipolar disorder Cousin     Allergies  Allergen Reactions   Ciprofloxacin Hcl Other (See Comments)    unknown   Imitrex [Sumatriptan Succinate] Diarrhea and Nausea And Vomiting    severe   Sumatriptan Other (See Comments)    "tightness in my chest and dizziness"    Current Outpatient Medications on File Prior to Visit  Medication Sig Dispense Refill   amitriptyline (ELAVIL) 25 MG tablet TAKE 1 TABLET(25 MG) BY MOUTH AT BEDTIME FOR MIGRAINE PREVENTION 90 tablet 2   cyclobenzaprine (FLEXERIL) 5 MG tablet Take 1 tablet (5 mg total) by mouth 2 (two) times daily as needed (migraines). 30 tablet 0   DULoxetine (CYMBALTA) 30 MG capsule TAKE 1 CAPSULE(30 MG) BY MOUTH DAILY FOR ANXIETY OR DEPRESSION OR PAIN 90 capsule 2   Erenumab-aooe (AIMOVIG) 140 MG/ML SOAJ Inject 140 mg into the skin every 30 (thirty) days.     ibuprofen (ADVIL) 400 MG tablet Take 400 mg by mouth every 6 (six) hours as needed.     ondansetron (ZOFRAN-ODT) 4 MG disintegrating tablet DISSOLVE 1 TABLET(4 MG) ON THE TONGUE EVERY 8 HOURS AS NEEDED FOR NAUSEA OR VOMITING 20 tablet 0   rizatriptan (MAXALT) 10 MG tablet Take 1 tablet by mouth at migraine onset, May repeat in 2 hours if needed 10 tablet 0   SELENIUM PO Take by mouth daily.     valACYclovir (VALTREX) 1000 MG tablet TAKE 2 TABLETS BY MOUTH TWICE DAILY FOR 1 DAY AS NEEDED FOR OUTBREAK 12 tablet 0   metroNIDAZOLE (FLAGYL) 500 MG tablet Take 1 tablet (500 mg total) by mouth 2 (two) times daily. flagyl 500mg  twice  a day for 7 days (Patient not taking: Reported on 11/02/2022) 14 tablet 0   No current  facility-administered medications on file prior to visit.    BP 128/86   Pulse 80   Temp 98.2 F (36.8 C) (Temporal)   Ht 5\' 5"  (1.651 m)   Wt 225 lb (102.1 kg)   LMP 10/25/2022 (Exact Date)   SpO2 99%   BMI 37.44 kg/m  Objective:   Physical Exam Cardiovascular:     Rate and Rhythm: Normal rate and regular rhythm.  Pulmonary:     Effort: Pulmonary effort is normal.     Breath sounds: Normal breath sounds.  Musculoskeletal:     Cervical back: Neck supple.     Right lower leg: No edema.     Left lower leg: No edema.  Skin:    General: Skin is warm and dry.  Psychiatric:        Mood and Affect: Mood normal.     Comments: Tearful at times during visit           Assessment & Plan:  Anxiety and depression Assessment & Plan: Deteriorated.  Continue Cymbalta 30 mg daily. Continue with therapy weekly.  Referral placed to psychiatry for evaluation of symptoms and for ADHD testing.  Orders: -     Ambulatory referral to Psychiatry  Migraine without status migrainosus, not intractable, unspecified migraine type Assessment & Plan: Repeat MRI brain ordered and pending.  Orders: -     MR BRAIN W WO CONTRAST; Future  Abnormal finding on MRI of brain -     MR BRAIN W WO CONTRAST; Future  Swelling of lower extremity Assessment & Plan: Bilaterally. Not apparent on exam today.  It's possible that amitriptyline could be causing symptoms. Reviewed side effects with patient today. Lower suspicion for DVT, CHF, venous insufficiency.   Wean off amitriptyline, reduce to 1/2 tablet daily x 2 weeks, then stop. She will update.          Pleas Koch, NP

## 2022-11-02 NOTE — Assessment & Plan Note (Signed)
Bilaterally. Not apparent on exam today.  It's possible that amitriptyline could be causing symptoms. Reviewed side effects with patient today. Lower suspicion for DVT, CHF, venous insufficiency.   Wean off amitriptyline, reduce to 1/2 tablet daily x 2 weeks, then stop. She will update.

## 2022-11-02 NOTE — Patient Instructions (Signed)
Reduce your amitriptyline to 1/2 tablet daily for 2 weeks, then stop.  You will either be contacted via phone regarding your referral to psychiatry, or you may receive a letter on your MyChart portal from our referral team with instructions for scheduling an appointment. Please let us know if you have not been contacted by anyone within two weeks.  You will either be contacted via phone regarding your MRI, or you may receive a letter on your MyChart portal from our referral team with instructions for scheduling an appointment. Please let us know if you have not been contacted by anyone within two weeks.  It was a pleasure to see you today!

## 2022-11-02 NOTE — Assessment & Plan Note (Signed)
Repeat MRI brain ordered and pending.

## 2022-11-02 NOTE — Assessment & Plan Note (Signed)
Deteriorated.  Continue Cymbalta 30 mg daily. Continue with therapy weekly.  Referral placed to psychiatry for evaluation of symptoms and for ADHD testing.

## 2022-11-07 ENCOUNTER — Encounter: Payer: Self-pay | Admitting: *Deleted

## 2022-11-09 ENCOUNTER — Telehealth: Payer: Self-pay | Admitting: *Deleted

## 2022-11-09 ENCOUNTER — Other Ambulatory Visit: Payer: Self-pay | Admitting: *Deleted

## 2022-11-09 NOTE — Telephone Encounter (Signed)
Patient wants to go to [facility] in [facility] 11/10/2022 for lab draw - antiphosphatidylserine. Please release lab. Thank you.

## 2022-11-10 ENCOUNTER — Other Ambulatory Visit: Payer: Self-pay | Admitting: *Deleted

## 2022-11-10 NOTE — Telephone Encounter (Signed)
I called patient, Lab cannot be performed at [facility], patient will come to office to have lab drawn.

## 2022-11-11 DIAGNOSIS — M7989 Other specified soft tissue disorders: Secondary | ICD-10-CM

## 2022-11-11 DIAGNOSIS — B001 Herpesviral vesicular dermatitis: Secondary | ICD-10-CM

## 2022-11-13 ENCOUNTER — Other Ambulatory Visit: Payer: Self-pay | Admitting: Primary Care

## 2022-11-19 ENCOUNTER — Other Ambulatory Visit: Payer: Self-pay | Admitting: Primary Care

## 2022-11-19 DIAGNOSIS — B001 Herpesviral vesicular dermatitis: Secondary | ICD-10-CM

## 2022-11-20 ENCOUNTER — Other Ambulatory Visit: Payer: Self-pay | Admitting: *Deleted

## 2022-11-20 ENCOUNTER — Ambulatory Visit: Payer: Medicaid Other | Admitting: Nurse Practitioner

## 2022-11-20 ENCOUNTER — Encounter: Payer: Self-pay | Admitting: Nurse Practitioner

## 2022-11-20 VITALS — BP 120/74 | HR 62 | Wt 228.0 lb

## 2022-11-20 DIAGNOSIS — R899 Unspecified abnormal finding in specimens from other organs, systems and tissues: Secondary | ICD-10-CM

## 2022-11-20 DIAGNOSIS — N939 Abnormal uterine and vaginal bleeding, unspecified: Secondary | ICD-10-CM | POA: Diagnosis not present

## 2022-11-20 DIAGNOSIS — M3501 Sicca syndrome with keratoconjunctivitis: Secondary | ICD-10-CM

## 2022-11-20 DIAGNOSIS — B9689 Other specified bacterial agents as the cause of diseases classified elsewhere: Secondary | ICD-10-CM | POA: Diagnosis not present

## 2022-11-20 DIAGNOSIS — N760 Acute vaginitis: Secondary | ICD-10-CM

## 2022-11-20 DIAGNOSIS — R768 Other specified abnormal immunological findings in serum: Secondary | ICD-10-CM

## 2022-11-20 DIAGNOSIS — N898 Other specified noninflammatory disorders of vagina: Secondary | ICD-10-CM

## 2022-11-20 DIAGNOSIS — N926 Irregular menstruation, unspecified: Secondary | ICD-10-CM | POA: Diagnosis not present

## 2022-11-20 LAB — PREGNANCY, URINE: Preg Test, Ur: NEGATIVE

## 2022-11-20 LAB — WET PREP FOR TRICH, YEAST, CLUE

## 2022-11-20 MED ORDER — METRONIDAZOLE 0.75 % VA GEL: 1.0000 | Freq: Every day | VAGINAL | 0 refills | Status: AC

## 2022-11-20 NOTE — Progress Notes (Signed)
   Acute Office Visit  Subjective:    Patient ID: Nichole Mcbride, female    DOB: 09-08-1986, 37 y.o.   MRN: [id number]   HPI 37 y.o. [id number] presents today for irregular menses. Had bleeding 2 days last week, stopped then started spotting last night. Seen as new patient 10/11/2022 with complaints of bleeding x 3 months. Normal cycles prior. Sexually active. IUD in the past. Did not tolerate COCs, felt it affected her moods. Normal pap history. H/O hashimotos, managed by endocrinology, no treatment. 10/11/22 negative STD screening, + BV (treated with PO Flagyl), UPT negative, normal TSH. A1c 5.5 in December. Recommended ultrasound or to start hormonal contraception. Complains of vaginal itching today without discharge or odor.    Review of Systems  Constitutional:  Positive for unexpected weight change.  Genitourinary:  Positive for menstrual problem and vaginal pain (Itching). Negative for vaginal discharge.       Objective:    Physical Exam Constitutional:      Appearance: Normal appearance.  Genitourinary:    General: Normal vulva.     Vagina: Vaginal discharge present. No erythema.     Cervix: Normal.     Uterus: Normal.      BP 120/74   Pulse 62   Wt 228 lb (103.4 kg)   LMP 11/13/2022 (Exact Date)   SpO2 100%   BMI 37.94 kg/m  Wt Readings from Last 3 Encounters:  11/20/22 228 lb (103.4 kg)  11/02/22 225 lb (102.1 kg)  09/14/22 225 lb (102.1 kg)        Patient informed chaperone available to be present for breast and/or pelvic exam. Patient has requested no chaperone to be present. Patient has been advised what will be completed during breast and pelvic exam.   Wet prep + clue cells + odor  UPT negative  Assessment & Plan:   Problem List Items Addressed This Visit       Genitourinary   Abnormal uterine bleeding - Primary   Relevant Orders   Pregnancy, urine   Estradiol   Follicle stimulating hormone   Luteinizing hormone   Testos,Total,Free and  SHBG (Female)   US PELVIS TRANSVAGINAL NON-OB (TV ONLY)   Other Visit Diagnoses     Vaginal itching       Relevant Orders   WET PREP FOR TRICH, YEAST, CLUE   Bacterial vaginosis       Relevant Medications   metroNIDAZOLE (METROGEL) 0.75 % vaginal gel      Plan: Wet prep positive for clue cells - Metrogel 0.75% nightly x 5 nights. PCOS workup. She would also like ultrasound. She is unsure about starting birth control. We did discuss progestin-only methods. Will discuss more after workup completed.      [place], 4:02 PM 11/20/2022

## 2022-11-21 ENCOUNTER — Ambulatory Visit: Admission: RE | Admit: 2022-11-21 | Payer: Medicaid Other | Source: Ambulatory Visit

## 2022-11-24 ENCOUNTER — Telehealth: Payer: Self-pay

## 2022-11-24 ENCOUNTER — Ambulatory Visit
Admission: RE | Admit: 2022-11-24 | Discharge: 2022-11-24 | Disposition: A | Discharge status: Home / Self Care | Payer: Medicaid Other | Source: Ambulatory Visit | Attending: Primary Care | Admitting: Primary Care

## 2022-11-24 ENCOUNTER — Other Ambulatory Visit: Payer: Medicaid Other

## 2022-11-24 DIAGNOSIS — M7989 Other specified soft tissue disorders: Secondary | ICD-10-CM | POA: Diagnosis not present

## 2022-11-24 DIAGNOSIS — R600 Localized edema: Secondary | ICD-10-CM | POA: Diagnosis not present

## 2022-11-24 DIAGNOSIS — Z419 Encounter for procedure for purposes other than remedying health state, unspecified: Secondary | ICD-10-CM | POA: Diagnosis not present

## 2022-11-24 LAB — TESTOS,TOTAL,FREE AND SHBG (FEMALE)
Free Testosterone: 1.7 pg/mL (ref 0.1–6.4)
Sex Hormone Binding: 62 nmol/L (ref 17–124)
Testosterone, Total, LC-MS-MS: 20 ng/dL (ref 2–45)

## 2022-11-24 LAB — HEMOGLOBIN A1C
Hgb A1c MFr Bld: 5.7 % of total Hgb — ABNORMAL HIGH (ref <5.7)
Mean Plasma Glucose: 117 mg/dL
eAG (mmol/L): 6.5 mmol/L

## 2022-11-24 LAB — PHOSPHATIDYLSERINE/PROTHROMBIN (PS/PT) ANTIBODIES (IGG, IGM)
Phosphatidylserine/Prothrombin Ab (IgG): <9 U (ref <=30)
Phosphatidylserine/Prothrombin Ab (IgM): 32 U — ABNORMAL HIGH (ref <=30)

## 2022-11-24 LAB — FOLLICLE STIMULATING HORMONE: FSH: 6.9 m[IU]/mL

## 2022-11-24 LAB — ESTRADIOL: Estradiol: 95 pg/mL

## 2022-11-24 LAB — LUTEINIZING HORMONE: LH: 1.9 m[IU]/mL

## 2022-11-24 NOTE — Telephone Encounter (Signed)
FYI. Pt calling to inquire about lab results done on 11/20/2022. Pt notified that TW has not had the chance to review them yet since not usually in office on Fridays. Advised will return on Tuesday 11/28/22 and will more than likely contact her then if any recommendations. Pt voiced understanding.

## 2022-11-24 NOTE — Progress Notes (Signed)
Phosphatidylserine antibodies low titer positive, not significant.  No change in treatment advised.

## 2022-11-27 ENCOUNTER — Ambulatory Visit
Admission: RE | Admit: 2022-11-27 | Discharge: 2022-11-27 | Disposition: A | Discharge status: Home / Self Care | Payer: Medicaid Other | Source: Ambulatory Visit | Attending: Primary Care | Admitting: Primary Care

## 2022-11-27 DIAGNOSIS — R9089 Other abnormal findings on diagnostic imaging of central nervous system: Secondary | ICD-10-CM | POA: Insufficient documentation

## 2022-11-27 DIAGNOSIS — G43909 Migraine, unspecified, not intractable, without status migrainosus: Secondary | ICD-10-CM | POA: Insufficient documentation

## 2022-11-27 DIAGNOSIS — R519 Headache, unspecified: Secondary | ICD-10-CM | POA: Diagnosis not present

## 2022-11-27 MED ORDER — GADOBUTROL 1 MMOL/ML IV SOLN
10.0000 mL | Freq: Once | INTRAVENOUS | Status: AC | PRN
  Administered 2022-11-27: 10 mL via INTRAVENOUS

## 2022-11-28 MED ORDER — VALACYCLOVIR HCL 1 G PO TABS: ORAL_TABLET | ORAL | 0 refills | Status: DC

## 2022-11-29 NOTE — Telephone Encounter (Signed)
Results message sent. Thanks.

## 2022-12-08 ENCOUNTER — Telehealth: Payer: Self-pay | Admitting: Primary Care

## 2022-12-08 NOTE — Telephone Encounter (Signed)
Pt called stating [facility] stated they never received a prescription for valACYclovir (VALTREX) 1000 MG tablet. Call back # [phone]

## 2022-12-08 NOTE — Telephone Encounter (Signed)
Called and spoke to pharmacy, they advised the prescription was received and is ready for patient to pickup.   Left voicemail for patient advising of the information.

## 2022-12-11 ENCOUNTER — Telehealth: Payer: Self-pay | Admitting: Primary Care

## 2022-12-11 ENCOUNTER — Encounter: Payer: Self-pay | Admitting: *Deleted

## 2022-12-11 NOTE — Telephone Encounter (Signed)
Patient called in regarding her referral that was sent out to psychiatry for her. She would like to know the reason for it being denied. Please advise.

## 2022-12-11 NOTE — Telephone Encounter (Signed)
Per referral note patients referral was denied due to missed appts.  Advised patient of this information. She is requesting the referral be sent elsewhere.   Are we able to sent the referral somewhere else that will accept her?

## 2022-12-11 NOTE — Telephone Encounter (Signed)
I can send to Dr Nicolasa Ducking  Dr Cephus Shelling, MD, [address] [place], [place] [address] Phone: [phone] Fax: [phone]   She needs to make sure that she keeps her appts and does not no show, they do not take those lightly.   I will send her a mychart message if she is active or call her with the referral information

## 2022-12-11 NOTE — Telephone Encounter (Signed)
Dr Nicolasa Ducking office called in stated pt is not in network for referral

## 2022-12-11 NOTE — Telephone Encounter (Signed)
Patient has been advised

## 2022-12-25 DIAGNOSIS — Z419 Encounter for procedure for purposes other than remedying health state, unspecified: Secondary | ICD-10-CM | POA: Diagnosis not present

## 2022-12-26 ENCOUNTER — Ambulatory Visit: Payer: Medicaid Other | Admitting: Nurse Practitioner

## 2022-12-26 ENCOUNTER — Encounter: Payer: Self-pay | Admitting: Nurse Practitioner

## 2022-12-26 ENCOUNTER — Other Ambulatory Visit (INDEPENDENT_AMBULATORY_CARE_PROVIDER_SITE_OTHER): Payer: Self-pay | Admitting: Nurse Practitioner

## 2022-12-26 ENCOUNTER — Ambulatory Visit (INDEPENDENT_AMBULATORY_CARE_PROVIDER_SITE_OTHER): Payer: Medicaid Other

## 2022-12-26 VITALS — BP 122/82 | HR 92

## 2022-12-26 DIAGNOSIS — N939 Abnormal uterine and vaginal bleeding, unspecified: Secondary | ICD-10-CM | POA: Diagnosis not present

## 2022-12-26 DIAGNOSIS — N760 Acute vaginitis: Secondary | ICD-10-CM

## 2022-12-26 DIAGNOSIS — R600 Localized edema: Secondary | ICD-10-CM

## 2022-12-26 DIAGNOSIS — N949 Unspecified condition associated with female genital organs and menstrual cycle: Secondary | ICD-10-CM | POA: Diagnosis not present

## 2022-12-26 LAB — WET PREP FOR TRICH, YEAST, CLUE

## 2022-12-26 MED ORDER — NUVESSA 1.3 % VA GEL
1.0000 | Freq: Once | VAGINAL | 0 refills | Status: AC
Start: 2022-12-26 — End: 2022-12-26

## 2022-12-26 MED ORDER — NORETHINDRONE 0.35 MG PO TABS
1.0000 | ORAL_TABLET | Freq: Every day | ORAL | 1 refills | Status: DC
Start: 2022-12-26 — End: 2023-06-11

## 2022-12-26 NOTE — Progress Notes (Signed)
   Acute Office Visit  Subjective:    Patient ID: Akanksha Zelasko, female    DOB: [birth year], 37 y.o.   MRN: [id number]   HPI 37 y.o. [id number] presents today for ultrasound.  Seen 11/20/2022 with complaints of irregular bleeding. Prior to this she was seen in January with complaints of bleeding x 3 months. Normal cycles prior. Sexually active. IUD in the past. Did not tolerate COCs, felt it affected her moods. H/O hashimotos, managed by endocrinology, no treatment, normal thyroid panel in January as well as negative STD screening, UPT negative. Treated for BV 10/11/2022 and  11/20/22. Complains of vaginitis symptoms again today, mostly itching and irritation. A1c 5.7, negative PCOS workup. Considering pregnancy in 1-2 years. LMP 11/16/22.   Review of Systems  Constitutional: Negative.   Genitourinary:  Positive for menstrual problem and vaginal pain (Itching/irritation). Negative for vaginal discharge.       Objective:    Physical Exam Constitutional:      Appearance: Normal appearance.  Genitourinary:    General: Normal vulva.     Vagina: Normal.     Cervix: Normal.     BP 122/82   Pulse 92   LMP 12/15/2022 (Approximate)   SpO2 98%  Wt Readings from Last 3 Encounters:  11/20/22 228 lb (103.4 kg)  11/02/22 225 lb (102.1 kg)  09/14/22 225 lb (102.1 kg)        Patient informed chaperone available to be present for breast and/or pelvic exam. Patient has requested no chaperone to be present. Patient has been advised what will be completed during breast and pelvic exam.   Wet prep + clue cells (+ odor)  Assessment & Plan:   Problem List Items Addressed This Visit       Genitourinary   Abnormal uterine bleeding - Primary   Relevant Medications   norethindrone (ORTHO MICRONOR) 0.35 MG tablet   Other Visit Diagnoses     Bacterial vaginosis       Relevant Medications   metroNIDAZOLE (NUVESSA) 1.3 % GEL   Vaginal burning       Relevant Orders   WET PREP FOR TRICH,  YEAST, CLUE      Vaginal ultrasound: Anteverted uterus, normal size and shape, no myometrial masses.  Symmetrical endometrium - 9.8 mm.  No masses or thickening or abnormal blood flow seen.  Both ovaries normal size, normal follicle pattern and normal perfusion. 3.6 x 2.1 cm simple cystic structure on the left side (cycle day 12), nontender.  No free fluid.  Plan: Normal ultrasound findings reviewed with patient. Discussed management options. She would like to try POPs. Aware of proper use. Wet prep positive for clue cells. Nuvessa 1.3 vaginal get once.     [place], 3:24 PM 12/26/2022

## 2022-12-29 NOTE — Progress Notes (Deleted)
Office Visit Note  Patient: Nichole Mcbride             Date of Birth: Dec 01, 1985           MRN: [id number]             PCP: Doreene Nest, NP Referring: Doreene Nest, NP Visit Date: 01/12/2023 Occupation: @GUAROCC @  Subjective:  No chief complaint on file.   History of Present Illness: Nichole Mcbride is a 37 y.o. female ***     Activities of Daily Living:  Patient reports morning stiffness for *** {minute/hour:19697}.   Patient {ACTIONS;DENIES/REPORTS:21021675::"Denies"} nocturnal pain.  Difficulty dressing/grooming: {ACTIONS;DENIES/REPORTS:21021675::"Denies"} Difficulty climbing stairs: {ACTIONS;DENIES/REPORTS:21021675::"Denies"} Difficulty getting out of chair: {ACTIONS;DENIES/REPORTS:21021675::"Denies"} Difficulty using hands for taps, buttons, cutlery, and/or writing: {ACTIONS;DENIES/REPORTS:21021675::"Denies"}  No Rheumatology ROS completed.   PMFS History:  Patient Active Problem List   Diagnosis Date Noted   Swelling of lower extremity 11/02/2022   Abnormal uterine bleeding 09/14/2022   Class 2 obesity due to excess calories with body mass index (BMI) of 37.0 to 37.9 in adult 09/14/2022   Family history of thyroid disease 07/05/2021   Joint pain in both hands 02/15/2021   Generalized body aches 01/25/2021   MDD (major depressive disorder), recurrent episode, moderate 08/08/2018   Prediabetes 01/25/2018   BMI 31.0-31.9,adult 07/09/2017   Rash and nonspecific skin eruption 06/15/2016   Herpes labialis 06/15/2016   Preventative health care 05/04/2016   Fibromyalgia 03/23/2016   Anxiety and depression 03/23/2016   Sjogren's disease 03/23/2016   Migraines 10/14/2011   Raynaud's disease 09/20/2011   SCOLIOSIS 06/10/2008    Past Medical History:  Diagnosis Date   Anxiety    Blunt trauma of face 01/25/2021   Carpal tunnel syndrome    bilaterally   DEPRESSION 06/10/2008   Hashimoto's thyroiditis    INSOMNIA 06/10/2008   Migraine  headache    Raynaud phenomenon    Rib pain on right side 04/29/2020   SCOLIOSIS 06/10/2008   Sjogren's disease    STD (sexually transmitted disease)    hsv oral   Supervision of other normal pregnancy, antepartum 07/09/2017   Clinic  [facility]  Prenatal Labs  Dating     Blood type:     Genetic Screen  1 Screen:    AFP:     Quad:     NIPS:  Antibody:   Anatomic [place]     Rubella:   Varicella:    GTT  Early:               Third trimester:   RPR:     Rhogam     HBsAg:     TDaP vaccine                        Flu Shot:  HIV:     Baby Food                                  GBS:   Contraception     Pap:  CBB         CS/VBAC          Family History  Problem Relation Age of Onset   Thyroid disease Mother    Transient ischemic attack Mother        x3   Diabetes Mother    Hypertension Mother    Hypothyroidism Mother    Depression Mother  Thyroid disease Father    Hypertension Father    Hypothyroidism Father    Rheum arthritis Father    Depression Brother    Cancer Paternal Grandfather    Bipolar disorder Cousin    Past Surgical History:  Procedure Laterality Date   INTRAUTERINE DEVICE INSERTION  11/05/2009   Social History   Social History Narrative   Married.   2 children.   Teaches English.   Enjoys spending time with family.   Immunization History  Administered Date(s) Administered   Hepatitis A, Adult 04/25/2012, 08/25/2015   Hepatitis B 07/11/1999, 09/30/1999, 01/23/2000   Influenza Whole 11/19/2012   Moderna Sars-Covid-2 Vaccination 02/26/2020, 03/18/2020   Td 07/28/2020   Tdap 04/06/2013     Objective: Vital Signs: LMP 12/15/2022 (Approximate)    Physical Exam   Musculoskeletal Exam: ***  CDAI Exam: CDAI Score: -- Patient Global: --; Provider Global: -- Swollen: --; Tender: -- Joint Exam 01/12/2023   No joint exam has been documented for this visit   There is currently no information documented on the homunculus. Go to the Rheumatology activity and complete  the homunculus joint exam.  Investigation: No additional findings.  Imaging: US PELVIS TRANSVAGINAL NON-OB (TV ONLY)  Result Date: 12/26/2022 Vaginal ultrasound: Anteverted uterus, normal size and shape, no myometrial masses.  Symmetrical endometrium - 9.8 mm.  No masses or thickening or abnormal blood flow seen.  Both ovaries normal size, normal follicle pattern and normal perfusion. 3.6 x 2.1 cm simple cystic structure on the left side (cycle day 12), nontender.  No free fluid.    Recent Labs: Lab Results  Component Value Date   WBC 4.8 01/25/2021   HGB 13.5 01/25/2021   PLT 280.0 01/25/2021   NA 139 01/25/2021   K 4.4 01/25/2021   CL 104 01/25/2021   CO2 28 01/25/2021   GLUCOSE 93 01/25/2021   BUN 12 01/25/2021   CREATININE 0.86 01/25/2021   BILITOT 0.8 01/25/2021   ALKPHOS 53 01/25/2021   AST 16 01/25/2021   ALT 11 01/25/2021   PROT 6.6 01/25/2021   ALBUMIN 4.3 01/25/2021   CALCIUM 9.1 01/25/2021   GFRAA 137 09/20/2011    Speciality Comments: No specialty comments available.  Procedures:  No procedures performed Allergies: Ciprofloxacin hcl, Imitrex [sumatriptan succinate], and Sumatriptan   Assessment / Plan:     Visit Diagnoses: Sjogren's syndrome with keratoconjunctivitis sicca  Positive ANA (antinuclear antibody)  Raynaud's disease without gangrene  Fibromyalgia  Prediabetes  Hx of migraines  Dysmenorrhea  Anxiety and depression  History of IBS  Herpes labialis  Orders: No orders of the defined types were placed in this encounter.  No orders of the defined types were placed in this encounter.   Face-to-face time spent with patient was *** minutes. Greater than 50% of time was spent in counseling and coordination of care.  Follow-Up Instructions: No follow-ups on file.   Gearldine Bienenstockaylor M Sherly Brodbeck, PA-C  Note - This record has been created using Dragon software.  Chart creation errors have been sought, but may not always  have been located. Such  creation errors do not reflect on  the standard of medical care.

## 2023-01-01 DIAGNOSIS — Z5181 Encounter for therapeutic drug level monitoring: Secondary | ICD-10-CM | POA: Diagnosis not present

## 2023-01-01 DIAGNOSIS — F32A Depression, unspecified: Secondary | ICD-10-CM | POA: Diagnosis not present

## 2023-01-01 DIAGNOSIS — F411 Generalized anxiety disorder: Secondary | ICD-10-CM | POA: Diagnosis not present

## 2023-01-06 DIAGNOSIS — N3090 Cystitis, unspecified without hematuria: Secondary | ICD-10-CM | POA: Diagnosis not present

## 2023-01-06 DIAGNOSIS — R519 Headache, unspecified: Secondary | ICD-10-CM | POA: Diagnosis not present

## 2023-01-06 DIAGNOSIS — Z881 Allergy status to other antibiotic agents status: Secondary | ICD-10-CM | POA: Diagnosis not present

## 2023-01-07 DIAGNOSIS — I890 Lymphedema, not elsewhere classified: Secondary | ICD-10-CM | POA: Insufficient documentation

## 2023-01-07 NOTE — Progress Notes (Unsigned)
MRN : [id number]  Nichole Mcbride is a 37 y.o. (07-26-1986) female who presents with chief complaint of legs swell.  History of Present Illness:   Patient is seen for evaluation of leg pain and leg swelling. The patient first noticed the swelling remotely. The swelling is associated with pain and discoloration. The pain and swelling worsens with prolonged dependency and improves with elevation. The pain is unrelated to activity.  The patient notes that in the morning the legs are significantly improved but they steadily worsened throughout the course of the day. The patient also notes a steady worsening of the discoloration in the ankle and shin area.   The patient denies claudication symptoms.  The patient denies symptoms consistent with rest pain.  The patient denies and extensive history of DJD and LS spine disease.  The patient has no had any past angiography, interventions or vascular surgery.  Elevation makes the leg symptoms better, dependency makes them much worse. There is no history of ulcerations. The patient denies any recent changes in medications.  The patient has not been wearing graduated compression.  The patient denies a history of DVT or PE. There is no prior history of phlebitis. There is no history of primary lymphedema.  No history of malignancies. No history of trauma or groin or pelvic surgery. There is no history of radiation treatment to the groin or pelvis  The patient denies amaurosis fugax or recent TIA symptoms. There are no recent neurological changes noted. The patient denies recent episodes of angina or shortness of breath  No outpatient medications have been marked as taking for the 01/08/23 encounter (Appointment) with Gilda Crease, Latina Craver, MD.    Past Medical History:  Diagnosis Date   Anxiety    Blunt trauma of face 01/25/2021   Carpal tunnel syndrome    bilaterally   DEPRESSION 06/10/2008   Hashimoto's thyroiditis    INSOMNIA  06/10/2008   Migraine headache    Raynaud phenomenon    Rib pain on right side 04/29/2020   SCOLIOSIS 06/10/2008   Sjogren's disease    STD (sexually transmitted disease)    hsv oral   Supervision of other normal pregnancy, antepartum 07/09/2017   Clinic  [facility]  Prenatal Labs  Dating     Blood type:     Genetic Screen  1 Screen:    AFP:     Quad:     NIPS:  Antibody:   Anatomic [place]     Rubella:   Varicella:    GTT  Early:               Third trimester:   RPR:     Rhogam     HBsAg:     TDaP vaccine                        Flu Shot:  HIV:     Baby Food                                  GBS:   Contraception     Pap:  CBB         CS/VBAC          Past Surgical History:  Procedure Laterality Date   INTRAUTERINE DEVICE INSERTION  11/05/2009    Social History Social History   Tobacco Use   Smoking status: Former  Types: Cigarettes    Passive exposure: Past   Smokeless tobacco: Never  Vaping Use   Vaping Use: Some days  Substance Use Topics   Alcohol use: Not Currently   Drug use: Never    Family History Family History  Problem Relation Age of Onset   Thyroid disease Mother    Transient ischemic attack Mother        x3   Diabetes Mother    Hypertension Mother    Hypothyroidism Mother    Depression Mother    Thyroid disease Father    Hypertension Father    Hypothyroidism Father    Rheum arthritis Father    Depression Brother    Cancer Paternal Grandfather    Bipolar disorder Cousin     Allergies  Allergen Reactions   Ciprofloxacin Hcl Other (See Comments)    unknown   Imitrex [Sumatriptan Succinate] Diarrhea and Nausea And Vomiting    severe   Sumatriptan Other (See Comments)    "tightness in my chest and dizziness"     REVIEW OF SYSTEMS (Negative unless checked)  Constitutional: [] Weight loss  [] Fever  [] Chills Cardiac: [] Chest pain   [] Chest pressure   [] Palpitations   [] Shortness of breath when laying flat   [] Shortness of breath with exertion. Vascular:   [] Pain in legs with walking   [x] Pain in legs with standing  [] History of DVT   [] Phlebitis   [x] Swelling in legs   [] Varicose veins   [] Non-healing ulcers Pulmonary:   [] Uses home oxygen   [] Productive cough   [] Hemoptysis   [] Wheeze  [] COPD   [] Asthma Neurologic:  [] Dizziness   [] Seizures   [] History of stroke   [] History of TIA  [] Aphasia   [] Vissual changes   [] Weakness or numbness in arm   [] Weakness or numbness in leg Musculoskeletal:   [] Joint swelling   [x] Joint pain   [x] Low back pain Hematologic:  [] Easy bruising  [] Easy bleeding   [] Hypercoagulable state   [] Anemic Gastrointestinal:  [] Diarrhea   [] Vomiting  [] Gastroesophageal reflux/heartburn   [] Difficulty swallowing. Genitourinary:  [] Chronic kidney disease   [] Difficult urination  [] Frequent urination   [] Blood in urine Skin:  [] Rashes   [] Ulcers  Psychological:  [x] History of anxiety   []  History of major depression.  Physical Examination  There were no vitals filed for this visit. There is no height or weight on file to calculate BMI. Gen: WD/WN, NAD Head: [place]/AT, No temporalis wasting.  Ear/Nose/Throat: Hearing grossly intact, nares w/o erythema or drainage, pinna without lesions Eyes: PER, EOMI, sclera nonicteric.  Neck: Supple, no gross masses.  No JVD.  Pulmonary:  Good air movement, no audible wheezing, no use of accessory muscles.  Cardiac: RRR, precordium not hyperdynamic. Vascular:  scattered varicosities present bilaterally.  Mild venous stasis changes to the legs bilaterally.  3-4+ soft pitting edema, CEAP [id number]  Vessel Right Left  Radial Palpable Palpable  Gastrointestinal: soft, non-distended. No guarding/no peritoneal signs.  Musculoskeletal: M/S 5/5 throughout.  No deformity.  Neurologic: CN 2-12 intact. Pain and light touch intact in extremities.  Symmetrical.  Speech is fluent. Motor exam as listed above. Psychiatric: Judgment intact, Mood & affect appropriate for pt's clinical situation. Dermatologic:  Venous rashes no ulcers noted.  No changes consistent with cellulitis. Lymph : No lichenification or skin changes of chronic lymphedema.  CBC Lab Results  Component Value Date   WBC 4.8 01/25/2021   HGB 13.5 01/25/2021   HCT 40.2 01/25/2021   MCV 87.6 01/25/2021   PLT  280.0 01/25/2021    BMET    Component Value Date/Time   NA 139 01/25/2021 1058   NA 141 09/20/2011 1608   K 4.4 01/25/2021 1058   CL 104 01/25/2021 1058   CO2 28 01/25/2021 1058   GLUCOSE 93 01/25/2021 1058   BUN 12 01/25/2021 1058   BUN 11 09/20/2011 1608   CREATININE 0.86 01/25/2021 1058   CALCIUM 9.1 01/25/2021 1058   GFRNONAA 118 09/20/2011 1608   GFRAA 137 09/20/2011 1608   CrCl cannot be calculated (Patient's most recent lab result is older than the maximum 21 days allowed.).  COAG No results found for: "INR", "PROTIME"  Radiology US PELVIS TRANSVAGINAL NON-OB (TV ONLY)  Result Date: 12/26/2022 Vaginal ultrasound: Anteverted uterus, normal size and shape, no myometrial masses.  Symmetrical endometrium - 9.8 mm.  No masses or thickening or abnormal blood flow seen.  Both ovaries normal size, normal follicle pattern and normal perfusion. 3.6 x 2.1 cm simple cystic structure on the left side (cycle day 12), nontender.  No free fluid.     Assessment/Plan There are no diagnoses linked to this encounter.   Levora Dredge, MD  01/07/2023 1:08 PM

## 2023-01-08 ENCOUNTER — Ambulatory Visit (INDEPENDENT_AMBULATORY_CARE_PROVIDER_SITE_OTHER): Payer: Medicaid Other

## 2023-01-08 ENCOUNTER — Ambulatory Visit (INDEPENDENT_AMBULATORY_CARE_PROVIDER_SITE_OTHER): Payer: Medicaid Other | Admitting: Vascular Surgery

## 2023-01-08 ENCOUNTER — Encounter (INDEPENDENT_AMBULATORY_CARE_PROVIDER_SITE_OTHER): Payer: Self-pay | Admitting: Vascular Surgery

## 2023-01-08 VITALS — BP 138/92 | HR 80 | Resp 18 | Ht 65.0 in | Wt 230.0 lb

## 2023-01-08 DIAGNOSIS — I7300 Raynaud's syndrome without gangrene: Secondary | ICD-10-CM | POA: Diagnosis not present

## 2023-01-08 DIAGNOSIS — R600 Localized edema: Secondary | ICD-10-CM

## 2023-01-08 DIAGNOSIS — I890 Lymphedema, not elsewhere classified: Secondary | ICD-10-CM | POA: Diagnosis not present

## 2023-01-09 ENCOUNTER — Encounter: Payer: Self-pay | Admitting: Primary Care

## 2023-01-09 ENCOUNTER — Encounter: Payer: Self-pay | Admitting: Nurse Practitioner

## 2023-01-09 ENCOUNTER — Ambulatory Visit (INDEPENDENT_AMBULATORY_CARE_PROVIDER_SITE_OTHER): Payer: Medicaid Other | Admitting: Primary Care

## 2023-01-09 VITALS — BP 132/88 | HR 94 | Temp 98.5°F | Ht 65.0 in | Wt 229.0 lb

## 2023-01-09 DIAGNOSIS — G43009 Migraine without aura, not intractable, without status migrainosus: Secondary | ICD-10-CM

## 2023-01-09 DIAGNOSIS — N3001 Acute cystitis with hematuria: Secondary | ICD-10-CM | POA: Diagnosis not present

## 2023-01-09 DIAGNOSIS — K529 Noninfective gastroenteritis and colitis, unspecified: Secondary | ICD-10-CM | POA: Insufficient documentation

## 2023-01-09 MED ORDER — SULFAMETHOXAZOLE-TRIMETHOPRIM 800-160 MG PO TABS
1.0000 | ORAL_TABLET | Freq: Two times a day (BID) | ORAL | 0 refills | Status: DC
Start: 2023-01-09 — End: 2023-02-28

## 2023-01-09 NOTE — Assessment & Plan Note (Signed)
Uncontrolled now that she is without Aimovig.  Resume amitriptyline 25 mg daily with titration up to 50 mg after 1-2 weeks. She will continue to work with neurology to get her Aimovig approved again.  Reviewed ED notes, labs from [facility] through care everywhere.

## 2023-01-09 NOTE — Assessment & Plan Note (Signed)
Culture positive with E. coli, 10,000-50,000 cultures. Given her symptoms we will proceed with treatment.  Start Bactrim DS (sulfamethoxazole/trimethoprim) tablets for urinary tract infection. Take 1 tablet by mouth twice daily for 3 days.

## 2023-01-09 NOTE — Assessment & Plan Note (Signed)
Etiology unclear.  Stool studies pending to rule out viral disease, C. difficile, etc.  Consider right upper quadrant ultrasound to evaluate for gallbladder function. Consider GI referral.

## 2023-01-09 NOTE — Progress Notes (Signed)
Subjective:    Patient ID: Nichole Mcbride, female    DOB: 1986/06/15, 37 y.o.   MRN: [id number]  Hypertension Associated symptoms include headaches.  Diarrhea  Associated symptoms include abdominal pain and headaches.    Nichole Mcbride is a very pleasant 38 y.o. female with a history of migraines, MDD, fibromyalgia, Sjogrens' disease, anxiety and depression who presents today to discuss diarrhea, and ED follow up.  Her mother joins [place] today.  1) Migraine: She presented to [facility] ED in [place] on 01/06/23 for a migraine x 24 hours without relief from home medications. She mentioned that insurance stopped covering preventative migraine treatment and migraines/headaches have returned over the last month. During her visit she was treated with a migraine cocktail. Labs showed culture positive cystitis (10,000-50,000) cultures of E coli. Other labs were grossly negative. She was discharged home later that day. She has not been treated for her urine culture. She does have dysuria and pelvic discomfort.   Since her discharge she continues to experience headaches, but her migraine resolved. Her Aimovig is pending prior authorization, has been without for about 2 months. She continues daily headaches to the frontal lobes and behind her right or left eye.    2) Diarrhea: Chronic for years. Since late January 2024 her episodes of diarrhea increased in frequency. Episodes are some days loose and some days watery. She is having 5-7 episodes daily.   Diarrhea will occur within 30 minutes after eating a meal. She does have her gall bladder, denies RUQ pain.   She's also noticed intermittent bilateral lower abdominal pain, cramping, with the need to defecate. Cramping will improve temporarily with a bowel movement. Diagnosed with IBS years ago.   Follows with GYN for recurrent bacterial vaginosis, has been treated several times with metronidazole, 1 course of pills and 2 courses of  gel. Last treatment was in early April 2024.   Review of Systems  Gastrointestinal:  Positive for abdominal pain and diarrhea.  Neurological:  Positive for headaches.         Past Medical History:  Diagnosis Date   Anxiety    Blunt trauma of face 01/25/2021   Carpal tunnel syndrome    bilaterally   DEPRESSION 06/10/2008   Hashimoto's thyroiditis    INSOMNIA 06/10/2008   Migraine headache    Raynaud phenomenon    Rib pain on right side 04/29/2020   SCOLIOSIS 06/10/2008   Sjogren's disease    STD (sexually transmitted disease)    hsv oral   Supervision of other normal pregnancy, antepartum 07/09/2017   Clinic  [facility]  Prenatal Labs  Dating     Blood type:     Genetic Screen  1 Screen:    AFP:     Quad:     NIPS:  Antibody:   Anatomic [place]     Rubella:   Varicella:    GTT  Early:               Third trimester:   RPR:     Rhogam     HBsAg:     TDaP vaccine                        Flu Shot:  HIV:     [employer]  GBS:   Contraception     Pap:  CBB         CS/VBAC          Social History   Socioeconomic History   Marital status: Divorced    Spouse name: Not on file   Number of children: Not on file   Years of education: Not on file   Highest education level: Some college, no degree  Occupational History   Not on file  Tobacco Use   Smoking status: Former    Types: Cigarettes    Passive exposure: Past   Smokeless tobacco: Never  Vaping Use   Vaping Use: Some days  Substance and Sexual Activity   Alcohol use: Not Currently   Drug use: Never   Sexual activity: Yes    Partners: Male    Birth control/protection: Condom    Comment: condoms occ  Other Topics Concern   Not on file  Social History Narrative   Married.   2 children.   Teaches English.   Enjoys spending time with family.   Social Determinants of Health   Financial Resource Strain: Low Risk  (01/08/2023)   Overall Financial Resource Strain (CARDIA)    Difficulty of Paying  Living Expenses: Not very hard  Food Insecurity: No Food Insecurity (01/08/2023)   Hunger Vital Sign    Worried About Running Out of Food in the Last Year: Never true    Ran Out of Food in the Last Year: Never true  Transportation Needs: No Transportation Needs (01/08/2023)   PRAPARE - Administrator, Civil Service (Medical): No    Lack of Transportation (Non-Medical): No  Physical Activity: Insufficiently Active (01/08/2023)   Exercise Vital Sign    Days of Exercise per Week: 3 days    Minutes of Exercise per Session: 40 min  Stress: Stress Concern Present (01/08/2023)   [employer] of Occupational Health - Occupational Stress Questionnaire    Feeling of Stress : To some extent  Social Connections: Moderately Isolated (01/08/2023)   Social Connection and Isolation Panel [NHANES]    Frequency of Communication with Friends and Family: More than three times a week    Frequency of Social Gatherings with Friends and Family: More than three times a week    Attends Religious Services: 1 to 4 times per year    Active Member of [employer] or Organizations: No    Attends Engineer, structural: Not on file    Marital Status: Divorced  Intimate Partner Violence: Not on file    Past Surgical History:  Procedure Laterality Date   INTRAUTERINE DEVICE INSERTION  11/05/2009    Family History  Problem Relation Age of Onset   Thyroid disease Mother    Transient ischemic attack Mother        x3   Diabetes Mother    Hypertension Mother    Hypothyroidism Mother    Depression Mother    Thyroid disease Father    Hypertension Father    Hypothyroidism Father    Rheum arthritis Father    Depression Brother    Cancer Paternal Grandfather    Bipolar disorder Cousin     Allergies  Allergen Reactions   Ciprofloxacin Hcl Other (See Comments)    unknown   Imitrex [Sumatriptan Succinate] Diarrhea and Nausea And Vomiting    severe   Sumatriptan Other (See Comments)    "tightness  in my chest and dizziness"    Current Outpatient Medications on File Prior  to Visit  Medication Sig Dispense Refill   cyclobenzaprine (FLEXERIL) 5 MG tablet Take 1 tablet (5 mg total) by mouth 2 (two) times daily as needed (migraines). 30 tablet 0   DULoxetine (CYMBALTA) 30 MG capsule TAKE 1 CAPSULE(30 MG) BY MOUTH DAILY FOR ANXIETY OR DEPRESSION OR PAIN 90 capsule 2   ibuprofen (ADVIL) 400 MG tablet Take 400 mg by mouth every 6 (six) hours as needed.     norethindrone (ORTHO MICRONOR) 0.35 MG tablet Take 1 tablet (0.35 mg total) by mouth daily. 84 tablet 1   ondansetron (ZOFRAN-ODT) 4 MG disintegrating tablet DISSOLVE 1 TABLET(4 MG) ON THE TONGUE EVERY 8 HOURS AS NEEDED FOR NAUSEA OR VOMITING 20 tablet 0   rizatriptan (MAXALT) 10 MG tablet Take 1 tablet by mouth at migraine onset, May repeat in 2 hours if needed 10 tablet 0   SELENIUM PO Take by mouth daily.     valACYclovir (VALTREX) 1000 MG tablet Take 2 tablets by mouth twice daily for 1 day as needed for outbreak. 12 tablet 0   Erenumab-aooe (AIMOVIG) 140 MG/ML SOAJ Inject 140 mg into the skin every 30 (thirty) days. (Patient not taking: Reported on 01/09/2023)     No current facility-administered medications on file prior to visit.    BP 132/88   Pulse 94   Temp 98.5 F (36.9 C) (Temporal)   Ht  (1.651 m)   Wt 229 lb (103.9 kg)   LMP 01/03/2023 (Exact Date)   SpO2 98%   BMI 38.11 kg/m  Objective:   Physical Exam Cardiovascular:     Rate and Rhythm: Normal rate and regular rhythm.  Pulmonary:     Effort: Pulmonary effort is normal.     Breath sounds: Normal breath sounds.  Abdominal:     Tenderness: There is abdominal tenderness in the suprapubic area. There is no guarding.  Musculoskeletal:     Cervical back: Neck supple.  Skin:    General: Skin is warm and dry.           Assessment & Plan:  Chronic diarrhea Assessment & Plan: Etiology unclear.  Stool studies pending to rule out viral disease, C.  difficile, etc.  Consider right upper quadrant ultrasound to evaluate for gallbladder function. Consider GI referral.  Orders: -     C. difficile GDH and Toxin A/B; Future -     Gastrointestinal Pathogen Pnl RT, PCR; Future -     Giardia antigen; Future  Acute cystitis with hematuria Assessment & Plan: Culture positive with E. coli, 10,000-50,000 cultures. Given her symptoms we will proceed with treatment.  Start Bactrim DS (sulfamethoxazole/trimethoprim) tablets for urinary tract infection. Take 1 tablet by mouth twice daily for 3 days.   Orders: -     Sulfamethoxazole-Trimethoprim; Take 1 tablet by mouth 2 (two) times daily. For urinary tract infection.  Dispense: 6 tablet; Refill: 0  Migraine without aura and without status migrainosus, not intractable Assessment & Plan: Uncontrolled now that she is without Aimovig.  Resume amitriptyline 25 mg daily with titration up to 50 mg after 1-2 weeks. She will continue to work with neurology to get her Aimovig approved again.  Reviewed ED notes, labs from [facility] through care everywhere.         Doreene Nest, NP

## 2023-01-09 NOTE — Patient Instructions (Signed)
Start Bactrim DS (sulfamethoxazole/trimethoprim) tablets for urinary tract infection. Take 1 tablet by mouth twice daily for 3 days.  Stop by the lab prior to leaving today. I will notify you of your results once received.   Resume amitriptyline 25 mg at bedtime for headache prevention. Increase to 50 mg in 1-2 weeks.   It was a pleasure to see you today!

## 2023-01-10 ENCOUNTER — Encounter (INDEPENDENT_AMBULATORY_CARE_PROVIDER_SITE_OTHER): Payer: Self-pay | Admitting: Vascular Surgery

## 2023-01-11 ENCOUNTER — Ambulatory Visit: Payer: Medicaid Other | Admitting: Nurse Practitioner

## 2023-01-12 ENCOUNTER — Ambulatory Visit: Payer: Medicaid Other | Admitting: Rheumatology

## 2023-01-12 DIAGNOSIS — M797 Fibromyalgia: Secondary | ICD-10-CM

## 2023-01-12 DIAGNOSIS — F32A Anxiety disorder, unspecified: Secondary | ICD-10-CM

## 2023-01-12 DIAGNOSIS — I7300 Raynaud's syndrome without gangrene: Secondary | ICD-10-CM

## 2023-01-12 DIAGNOSIS — Z8719 Personal history of other diseases of the digestive system: Secondary | ICD-10-CM

## 2023-01-12 DIAGNOSIS — M3501 Sicca syndrome with keratoconjunctivitis: Secondary | ICD-10-CM

## 2023-01-12 DIAGNOSIS — B001 Herpesviral vesicular dermatitis: Secondary | ICD-10-CM

## 2023-01-12 DIAGNOSIS — R7303 Prediabetes: Secondary | ICD-10-CM

## 2023-01-12 DIAGNOSIS — N946 Dysmenorrhea, unspecified: Secondary | ICD-10-CM

## 2023-01-12 DIAGNOSIS — Z8669 Personal history of other diseases of the nervous system and sense organs: Secondary | ICD-10-CM

## 2023-01-12 DIAGNOSIS — R768 Other specified abnormal immunological findings in serum: Secondary | ICD-10-CM

## 2023-01-12 LAB — C. DIFFICILE GDH AND TOXIN A/B

## 2023-01-15 DIAGNOSIS — F411 Generalized anxiety disorder: Secondary | ICD-10-CM | POA: Diagnosis not present

## 2023-01-15 DIAGNOSIS — F32A Depression, unspecified: Secondary | ICD-10-CM | POA: Diagnosis not present

## 2023-01-15 LAB — GIARDIA ANTIGEN
RESULT:: NOT DETECTED
SPECIMEN QUALITY:: ADEQUATE

## 2023-01-15 LAB — C. DIFFICILE GDH AND TOXIN A/B: SPECIMEN QUALITY:: ADEQUATE

## 2023-01-16 ENCOUNTER — Encounter: Payer: Self-pay | Admitting: Nurse Practitioner

## 2023-01-16 DIAGNOSIS — K529 Noninfective gastroenteritis and colitis, unspecified: Secondary | ICD-10-CM

## 2023-01-16 DIAGNOSIS — R1011 Right upper quadrant pain: Secondary | ICD-10-CM

## 2023-01-16 NOTE — Telephone Encounter (Signed)
Routing to Tiffany to review and advise if any additional recommendations.

## 2023-01-17 ENCOUNTER — Encounter: Payer: Self-pay | Admitting: *Deleted

## 2023-01-17 ENCOUNTER — Ambulatory Visit (INDEPENDENT_AMBULATORY_CARE_PROVIDER_SITE_OTHER): Payer: Medicaid Other | Admitting: Primary Care

## 2023-01-17 ENCOUNTER — Encounter: Payer: Self-pay | Admitting: Primary Care

## 2023-01-17 VITALS — BP 134/78 | HR 85 | Temp 98.2°F | Ht 65.0 in | Wt 229.0 lb

## 2023-01-17 DIAGNOSIS — J029 Acute pharyngitis, unspecified: Secondary | ICD-10-CM | POA: Diagnosis not present

## 2023-01-17 LAB — POCT INFLUENZA A/B
Influenza A, POC: NEGATIVE
Influenza B, POC: NEGATIVE

## 2023-01-17 LAB — POCT RAPID STREP A (OFFICE): Rapid Strep A Screen: NEGATIVE

## 2023-01-17 LAB — POC COVID19 BINAXNOW: SARS Coronavirus 2 Ag: NEGATIVE

## 2023-01-17 NOTE — Assessment & Plan Note (Signed)
Rapid strep, Covid-19, and influenza tests negative today.  Exam is not consistent with strep pharyngitis infection.  Suspect viral etiology at this point. Discussed conservative treatment at home.  Add Flonase.  Follow up PRN.

## 2023-01-17 NOTE — Patient Instructions (Signed)
You can try a few things over the counter to help with your symptoms including:  Cough: Delsym or Robitussin (get the off brand, works just as well) Chest Congestion: Mucinex (plain) Nasal Congestion/Ear Pressure/Sinus Pressure: Try using Flonase (fluticasone) nasal spray. Instill 1 spray in each nostril twice daily. This can be purchased over the counter. Body aches, fevers, headache: Ibuprofen (not to exceed 2400 mg in 24 hours) or Acetaminophen-Tylenol (not to exceed 3000 mg in 24 hours) Runny Nose/Throat Drainage/Sneezing/Itchy or Watery Eyes: An antihistamine such as Zyrtec, Claritin, Xyzal, Allegra  You should be feeling better by day seven of symptoms, but please do contact me if this is not the case.  It was a pleasure to see you today!  

## 2023-01-17 NOTE — Telephone Encounter (Signed)
I clarified with her that POPs do not have a placebo week and to take one pill daily.

## 2023-01-17 NOTE — Progress Notes (Signed)
Subjective:    Patient ID: Nichole Mcbride, female    DOB: Jan 29, 1986, 37 y.o.   MRN: [id number]  Sore Throat  Pertinent negatives include no coughing.    Nichole Mcbride is a very pleasant 37 y.o. female with a history of migraines, raynaud's disease, sjogren's disese, fibromyalgia, depression, prediabetes, lymphedema who presents today to discuss sore throat.  Symptom onset yesterday with fevers (t-max 101), body aches, chills, ear pressure. This morning she woke up with a sore throat.   Her son was diagnosed and treated for strep throat 2 weekends ago. She has not recently tested for Covid-19 infection.   She's taken Ibuprofen this morning and last night. She denies post nasal drip, cough,   Review of Systems  Constitutional:  Positive for chills and fever.  HENT:  Negative for postnasal drip.   Respiratory:  Negative for cough.   Musculoskeletal:  Positive for myalgias.         Past Medical History:  Diagnosis Date   Anxiety    Blunt trauma of face 01/25/2021   Carpal tunnel syndrome    bilaterally   DEPRESSION 06/10/2008   Hashimoto's thyroiditis    INSOMNIA 06/10/2008   Migraine headache    Raynaud phenomenon    Rib pain on right side 04/29/2020   SCOLIOSIS 06/10/2008   Sjogren's disease    STD (sexually transmitted disease)    hsv oral   Supervision of other normal pregnancy, antepartum 07/09/2017   Clinic  [facility]  Prenatal Labs  Dating     Blood type:     Genetic Screen  1 Screen:    AFP:     Quad:     NIPS:  Antibody:   Anatomic [place]     Rubella:   Varicella:    GTT  Early:               Third trimester:   RPR:     Rhogam     HBsAg:     TDaP vaccine                        Flu Shot:  HIV:     Baby Food                                  GBS:   Contraception     Pap:  CBB         CS/VBAC          Social History   Socioeconomic History   Marital status: Divorced    Spouse name: Not on file   Number of children: Not on file   Years of  education: Not on file   Highest education level: Some college, no degree  Occupational History   Not on file  Tobacco Use   Smoking status: Former    Types: Cigarettes    Passive exposure: Past   Smokeless tobacco: Never  Vaping Use   Vaping Use: Some days  Substance and Sexual Activity   Alcohol use: Not Currently   Drug use: Never   Sexual activity: Yes    Partners: Male    Birth control/protection: Condom    Comment: HSV 1+  Other Topics Concern   Not on file  Social History Narrative   Married.   2 children.   Teaches English.   Enjoys spending time with family.   Social Determinants  of Health   Financial Resource Strain: Low Risk  (01/08/2023)   Overall Financial Resource Strain (CARDIA)    Difficulty of Paying Living Expenses: Not very hard  Food Insecurity: No Food Insecurity (01/08/2023)   Hunger Vital Sign    Worried About Running Out of Food in the Last Year: Never true    Ran Out of Food in the Last Year: Never true  Transportation Needs: No Transportation Needs (01/08/2023)   PRAPARE - Administrator, Civil Service (Medical): No    Lack of Transportation (Non-Medical): No  Physical Activity: Insufficiently Active (01/08/2023)   Exercise Vital Sign    Days of Exercise per Week: 3 days    Minutes of Exercise per Session: 40 min  Stress: Stress Concern Present (01/08/2023)   [employer] of Occupational Health - Occupational Stress Questionnaire    Feeling of Stress : To some extent  Social Connections: Moderately Isolated (01/08/2023)   Social Connection and Isolation Panel [NHANES]    Frequency of Communication with Friends and Family: More than three times a week    Frequency of Social Gatherings with Friends and Family: More than three times a week    Attends Religious Services: 1 to 4 times per year    Active Member of [employer] or Organizations: No    Attends Engineer, structural: Not on file    Marital Status: Divorced  Intimate  Partner Violence: Not on file    Past Surgical History:  Procedure Laterality Date   INTRAUTERINE DEVICE INSERTION  11/05/2009    Family History  Problem Relation Age of Onset   Thyroid disease Mother    Transient ischemic attack Mother        x3   Diabetes Mother    Hypertension Mother    Hypothyroidism Mother    Depression Mother    Thyroid disease Father    Hypertension Father    Hypothyroidism Father    Rheum arthritis Father    Depression Brother    Cancer Paternal Grandfather    Bipolar disorder Cousin     Allergies  Allergen Reactions   Ciprofloxacin Hcl Other (See Comments)    unknown   Imitrex [Sumatriptan Succinate] Diarrhea and Nausea And Vomiting    severe   Sumatriptan Other (See Comments)    "tightness in my chest and dizziness"    Current Outpatient Medications on File Prior to Visit  Medication Sig Dispense Refill   cyclobenzaprine (FLEXERIL) 5 MG tablet Take 1 tablet (5 mg total) by mouth 2 (two) times daily as needed (migraines). 30 tablet 0   DULoxetine (CYMBALTA) 30 MG capsule TAKE 1 CAPSULE(30 MG) BY MOUTH DAILY FOR ANXIETY OR DEPRESSION OR PAIN 90 capsule 2   Erenumab-aooe (AIMOVIG) 140 MG/ML SOAJ Inject 140 mg into the skin every 30 (thirty) days.     ibuprofen (ADVIL) 400 MG tablet Take 400 mg by mouth every 6 (six) hours as needed.     norethindrone (ORTHO MICRONOR) 0.35 MG tablet Take 1 tablet (0.35 mg total) by mouth daily. 84 tablet 1   ondansetron (ZOFRAN-ODT) 4 MG disintegrating tablet DISSOLVE 1 TABLET(4 MG) ON THE TONGUE EVERY 8 HOURS AS NEEDED FOR NAUSEA OR VOMITING 20 tablet 0   rizatriptan (MAXALT) 10 MG tablet Take 1 tablet by mouth at migraine onset, May repeat in 2 hours if needed 10 tablet 0   SELENIUM PO Take by mouth daily.     valACYclovir (VALTREX) 1000 MG tablet Take 2 tablets by mouth  twice daily for 1 day as needed for outbreak. 12 tablet 0   sulfamethoxazole-trimethoprim (BACTRIM DS) 800-160 MG tablet Take 1 tablet by mouth  2 (two) times daily. For urinary tract infection. (Patient not taking: Reported on 01/17/2023) 6 tablet 0   No current facility-administered medications on file prior to visit.    BP 134/78   Pulse 85   Temp 98.2 F (36.8 C) (Temporal)   Ht  (1.651 m)   Wt 229 lb (103.9 kg)   LMP 01/03/2023 (Exact Date)   SpO2 97%   BMI 38.11 kg/m  Objective:   Physical Exam HENT:     Right Ear: Ear canal normal. Tympanic membrane is retracted. Tympanic membrane is not erythematous.     Left Ear: Tympanic membrane and ear canal normal.     Nose:     Right Sinus: No maxillary sinus tenderness or frontal sinus tenderness.     Left Sinus: No maxillary sinus tenderness or frontal sinus tenderness.     Mouth/Throat:     Pharynx: No posterior oropharyngeal erythema.  Eyes:     Conjunctiva/sclera: Conjunctivae normal.  Cardiovascular:     Rate and Rhythm: Normal rate and regular rhythm.  Pulmonary:     Effort: Pulmonary effort is normal.     Breath sounds: Normal breath sounds. No wheezing or rales.  Musculoskeletal:     Cervical back: Neck supple.  Lymphadenopathy:     Cervical: No cervical adenopathy.  Skin:    General: Skin is warm and dry.           Assessment & Plan:  Sore throat Assessment & Plan: Rapid strep, Covid-19, and influenza tests negative today.  Exam is not consistent with strep pharyngitis infection.  Suspect viral etiology at this point. Discussed conservative treatment at home.  Add Flonase.  Follow up PRN.  Orders: -     POCT rapid strep A -     POC COVID-19 BinaxNow -     POCT Influenza A/B        Doreene Nest, NP

## 2023-01-17 NOTE — Telephone Encounter (Signed)
Tiffany - I do not see recommendations for continuous active pills. Was this discussed?

## 2023-01-18 LAB — C. DIFFICILE GDH AND TOXIN A/B: TOXIN A AND B: NOT DETECTED

## 2023-01-18 LAB — GIARDIA ANTIGEN

## 2023-01-18 NOTE — Telephone Encounter (Signed)
Spoke with patient. Confirmed all questions answered. Patient verbalizes understanding and is agreeable.    Encounter closed.

## 2023-01-19 ENCOUNTER — Other Ambulatory Visit: Payer: Self-pay | Admitting: Primary Care

## 2023-01-19 ENCOUNTER — Telehealth: Payer: Self-pay | Admitting: Primary Care

## 2023-01-19 DIAGNOSIS — G43909 Migraine, unspecified, not intractable, without status migrainosus: Secondary | ICD-10-CM

## 2023-01-19 LAB — GASTROINTESTINAL PATHOGEN PNL

## 2023-01-19 LAB — C. DIFFICILE GDH AND TOXIN A/B
GDH ANTIGEN: NOT DETECTED
MICRO NUMBER:: 14843359

## 2023-01-19 LAB — GIARDIA ANTIGEN: MICRO NUMBER:: 14843302

## 2023-01-19 MED ORDER — CYCLOBENZAPRINE HCL 5 MG PO TABS: 5.0000 mg | ORAL_TABLET | Freq: Two times a day (BID) | ORAL | 0 refills | Status: DC | PRN

## 2023-01-19 MED ORDER — ONDANSETRON 4 MG PO TBDP
4.0000 mg | ORAL_TABLET | Freq: Three times a day (TID) | ORAL | 0 refills | Status: DC | PRN
Start: 2023-01-19 — End: 2023-11-13

## 2023-01-19 NOTE — Telephone Encounter (Signed)
Prescription Request  01/19/2023  LOV: 01/17/2023  What is the name of the medication or equipment? cyclobenzaprine (FLEXERIL) 5 MG tablet & ondansetron (ZOFRAN-ODT) 4 MG disintegrating tablet   Have you contacted your pharmacy to request a refill? No   Which pharmacy would you like this sent to?  [facility][id number] [place], [place] - [address] AT [facility] OF [address] [place] [address] Phone: [phone] Fax: [phone]    Patient notified that their request is being sent to the clinical staff for review and that they should receive a response within 2 business days.   Please advise at Mobile [phone] (mobile)

## 2023-01-19 NOTE — Telephone Encounter (Signed)
From: Reita Cliche To: Office of Doreene Nest, NP Sent: 01/19/2023 3:59 PM EDT Subject: Medication Renewal Request  Refills have been requested for the following medications:   cyclobenzaprine (FLEXERIL) 5 MG tablet [[name]]   ondansetron (ZOFRAN-ODT) 4 MG disintegrating tablet [[name]]  Patient Comment: Trying to request both refills through here instead of [facility]   Preferred pharmacy: [facility] #[id number] Nicholes Rough, [place] - [address] AT [address] OF [address] Delivery method: [employer]

## 2023-01-19 NOTE — Telephone Encounter (Signed)
Refills sent to pharmacy. 

## 2023-01-22 DIAGNOSIS — R059 Cough, unspecified: Secondary | ICD-10-CM | POA: Diagnosis not present

## 2023-01-22 DIAGNOSIS — J029 Acute pharyngitis, unspecified: Secondary | ICD-10-CM | POA: Diagnosis not present

## 2023-01-22 DIAGNOSIS — J209 Acute bronchitis, unspecified: Secondary | ICD-10-CM | POA: Diagnosis not present

## 2023-01-22 DIAGNOSIS — J0100 Acute maxillary sinusitis, unspecified: Secondary | ICD-10-CM | POA: Diagnosis not present

## 2023-01-23 DIAGNOSIS — G5601 Carpal tunnel syndrome, right upper limb: Secondary | ICD-10-CM | POA: Diagnosis not present

## 2023-01-24 DIAGNOSIS — Z419 Encounter for procedure for purposes other than remedying health state, unspecified: Secondary | ICD-10-CM | POA: Diagnosis not present

## 2023-01-25 ENCOUNTER — Ambulatory Visit
Admission: RE | Admit: 2023-01-25 | Discharge: 2023-01-25 | Disposition: A | Discharge status: Home / Self Care | Payer: Medicaid Other | Source: Ambulatory Visit | Attending: Primary Care | Admitting: Primary Care

## 2023-01-25 DIAGNOSIS — K529 Noninfective gastroenteritis and colitis, unspecified: Secondary | ICD-10-CM | POA: Diagnosis not present

## 2023-01-25 DIAGNOSIS — R1011 Right upper quadrant pain: Secondary | ICD-10-CM | POA: Diagnosis not present

## 2023-01-25 DIAGNOSIS — R197 Diarrhea, unspecified: Secondary | ICD-10-CM | POA: Diagnosis not present

## 2023-01-25 DIAGNOSIS — R109 Unspecified abdominal pain: Secondary | ICD-10-CM | POA: Diagnosis not present

## 2023-01-25 DIAGNOSIS — K760 Fatty (change of) liver, not elsewhere classified: Secondary | ICD-10-CM | POA: Diagnosis not present

## 2023-01-29 DIAGNOSIS — F411 Generalized anxiety disorder: Secondary | ICD-10-CM | POA: Diagnosis not present

## 2023-01-29 DIAGNOSIS — F32A Depression, unspecified: Secondary | ICD-10-CM | POA: Diagnosis not present

## 2023-02-05 DIAGNOSIS — G5601 Carpal tunnel syndrome, right upper limb: Secondary | ICD-10-CM | POA: Diagnosis not present

## 2023-02-14 ENCOUNTER — Telehealth: Payer: Self-pay

## 2023-02-14 DIAGNOSIS — I890 Lymphedema, not elsewhere classified: Secondary | ICD-10-CM | POA: Diagnosis not present

## 2023-02-14 DIAGNOSIS — G5601 Carpal tunnel syndrome, right upper limb: Secondary | ICD-10-CM | POA: Diagnosis not present

## 2023-02-14 DIAGNOSIS — B001 Herpesviral vesicular dermatitis: Secondary | ICD-10-CM

## 2023-02-14 NOTE — Telephone Encounter (Signed)
Received refill request for   Valacyclovir 1000 mg tab

## 2023-02-14 NOTE — Progress Notes (Signed)
Office Visit Note  Patient: Nichole Mcbride             Date of Birth: 10-20-1985           MRN: [id number]             PCP: Doreene Nest, NP Referring: Doreene Nest, NP Visit Date: 02/28/2023 Occupation: @GUAROCC @  Subjective:  Dry mouth and dry eyes  History of Present Illness: Nichole Mcbride is a 37 y.o. female with Sjogren's and fibromyalgia syndrome.  She returns today after her last visit in October 2023.  She continues to have generalized pain and discomfort from fibromyalgia.  She has intermittent pain and discomfort in the bilateral hands.  She continues to have dry mouth and dry eyes.  She has been using some over-the-counter products.  She has been experiencing pain and discomfort in the bilateral hands.  She believes she may have intermittent swelling.  She complains of of intermittent Raynauds.  She also has ongoing rash on her trunk and photosensitivity.  She was advised by her PCP that it may be related to Aimovig.  She has noticed elevated blood pressure at several visits. Migraine headaches have improved.  She was recently diagnosed with lymphedema and venous stasis.  She has an appointment coming up with the gastroenterologist for chronic diarrhea.  Stool cultures were negative per patient.  Patient states that she has recurrent bacterial vaginosis which was treated with Bactrim recently.    Activities of Daily Living:  Patient reports morning stiffness for 2-3 hours.   Patient Reports nocturnal pain.  Difficulty dressing/grooming: Denies Difficulty climbing stairs: Denies Difficulty getting out of chair: Denies Difficulty using hands for taps, buttons, cutlery, and/or writing: Denies  Review of Systems  Constitutional:  Positive for fatigue.  HENT:  Positive for mouth dryness. Negative for mouth sores.   Eyes:  Positive for dryness.  Respiratory:  Negative for shortness of breath.   Cardiovascular:  Negative for chest pain and  palpitations.  Gastrointestinal:  Positive for diarrhea. Negative for blood in stool and constipation.  Endocrine: Negative for increased urination.  Genitourinary:  Negative for involuntary urination.  Musculoskeletal:  Positive for joint pain, joint pain, myalgias, morning stiffness and myalgias. Negative for gait problem, muscle weakness and muscle tenderness.  Skin:  Positive for color change, rash and sensitivity to sunlight. Negative for hair loss.  Allergic/Immunologic: Negative for susceptible to infections.  Neurological:  Positive for headaches. Negative for dizziness.  Hematological:  Negative for swollen glands.  Psychiatric/Behavioral:  Positive for depressed mood. Negative for sleep disturbance. The patient is nervous/anxious.     PMFS History:  Patient Active Problem List   Diagnosis Date Noted   Sore throat 01/17/2023   Chronic diarrhea 01/09/2023   Lymphedema 01/07/2023   Swelling of lower extremity 11/02/2022   Abnormal uterine bleeding 09/14/2022   Class 2 obesity due to excess calories with body mass index (BMI) of 37.0 to 37.9 in adult 09/14/2022   Family history of thyroid disease 07/05/2021   Joint pain in both hands 02/15/2021   Generalized body aches 01/25/2021   MDD (major depressive disorder), recurrent episode, moderate (HCC) 08/08/2018   Prediabetes 01/25/2018   BMI 31.0-31.9,adult 07/09/2017   Rash and nonspecific skin eruption 06/15/2016   Herpes labialis 06/15/2016   Preventative health care 05/04/2016   Fibromyalgia 03/23/2016   Anxiety and depression 03/23/2016   Sjogren's disease (HCC) 03/23/2016   Migraines 10/14/2011   Raynaud's disease 09/20/2011   Acute  cystitis with hematuria 06/10/2008   SCOLIOSIS 06/10/2008    Past Medical History:  Diagnosis Date   Anxiety    Blunt trauma of face 01/25/2021   Carpal tunnel syndrome    bilaterally   DEPRESSION 06/10/2008   Hashimoto's thyroiditis    INSOMNIA 06/10/2008   Lymphedema    dx by  vascular, per patient   Migraine headache    Raynaud phenomenon    Rib pain on right side 04/29/2020   SCOLIOSIS 06/10/2008   Sjogren's disease (HCC)    STD (sexually transmitted disease)    hsv oral   Supervision of other normal pregnancy, antepartum 07/09/2017   Clinic  [facility]  Prenatal Labs  Dating     Blood type:     Genetic Screen  1 Screen:    AFP:     Quad:     NIPS:  Antibody:   Anatomic [place]     Rubella:   Varicella:    GTT  Early:               Third trimester:   RPR:     Rhogam     HBsAg:     TDaP vaccine                        Flu Shot:  HIV:     Baby Food                                  GBS:   Contraception     Pap:  CBB         CS/VBAC          Family History  Problem Relation Age of Onset   Thyroid disease Mother    Transient ischemic attack Mother        x3   Diabetes Mother    Hypertension Mother    Hypothyroidism Mother    Depression Mother    Thyroid disease Father    Hypertension Father    Hypothyroidism Father    Rheum arthritis Father    Depression Brother    Cancer Paternal Grandfather    Bipolar disorder Cousin    Past Surgical History:  Procedure Laterality Date   INTRAUTERINE DEVICE INSERTION  11/05/2009   Social History   Social History Narrative   Married.   2 children.   Teaches English.   Enjoys spending time with family.   Immunization History  Administered Date(s) Administered   Hepatitis A, Adult 04/25/2012, 08/25/2015   Hepatitis B 07/11/1999, 09/30/1999, 01/23/2000   Influenza Whole 11/19/2012   Moderna Sars-Covid-2 Vaccination 02/26/2020, 03/18/2020   Td 07/28/2020   Tdap 04/06/2013     Objective: Vital Signs: BP (!) 152/101 (BP Location: Left Arm, Patient Position: Sitting, Cuff Size: Normal)   Pulse 81   Resp 16   Ht 5\' 5"  (1.651 m)   Wt 230 lb 9.6 oz (104.6 kg)   BMI 38.37 kg/m    Physical Exam Vitals and nursing note reviewed.  Constitutional:      Appearance: She is well-developed.  HENT:     Head:  Normocephalic and atraumatic.  Eyes:     Conjunctiva/sclera: Conjunctivae normal.  Cardiovascular:     Rate and Rhythm: Normal rate and regular rhythm.     Heart sounds: Normal heart sounds.  Pulmonary:     Effort: Pulmonary effort is normal.     Breath  sounds: Normal breath sounds.  Abdominal:     General: Bowel sounds are normal.     Palpations: Abdomen is soft.  Musculoskeletal:     Cervical back: Normal range of motion.  Lymphadenopathy:     Cervical: No cervical adenopathy.  Skin:    General: Skin is warm and dry.     Capillary Refill: Capillary refill takes less than 2 seconds.  Neurological:     Mental Status: She is alert and oriented to person, place, and time.  Psychiatric:        Behavior: Behavior normal.      Musculoskeletal Exam: Cervical thoracic and lumbar spine were in good range of motion.  Shoulder joints, elbow joints, wrist joints, MCPs PIPs and DIPs Juengel range of motion with no synovitis.  She had bilateral PIP and DIP thickening with no synovitis.  Hip joints and knee joints in good range of motion without any warmth swelling or effusion.  There was no tenderness over ankles or MTPs.  CDAI Exam: CDAI Score: -- Patient Global: --; Provider Global: -- Swollen: --; Tender: -- Joint Exam 02/28/2023   No joint exam has been documented for this visit   There is currently no information documented on the homunculus. Go to the Rheumatology activity and complete the homunculus joint exam.  Investigation: No additional findings.  Imaging: No results found.  Recent Labs: Lab Results  Component Value Date   WBC 4.8 01/25/2021   HGB 13.5 01/25/2021   PLT 280.0 01/25/2021   NA 139 01/25/2021   K 4.4 01/25/2021   CL 104 01/25/2021   CO2 28 01/25/2021   GLUCOSE 93 01/25/2021   BUN 12 01/25/2021   CREATININE 0.86 01/25/2021   BILITOT 0.8 01/25/2021   ALKPHOS 53 01/25/2021   AST 16 01/25/2021   ALT 11 01/25/2021   PROT 6.6 01/25/2021   ALBUMIN 4.3  01/25/2021   CALCIUM 9.1 01/25/2021   GFRAA 137 09/20/2011    Speciality Comments: No specialty comments available.  Procedures:  No procedures performed Allergies: Ciprofloxacin hcl, Imitrex [sumatriptan succinate], and Sumatriptan   Assessment / Plan:     Visit Diagnoses: Sjogren's syndrome with keratoconjunctivitis sicca (HCC) - History of sicca, fatigue, Raynauds, arthralgias, photosensitivity, miscarriage x1.  Positive ANA, positive Ro, positive anti-centromere, RF-: -Patient continues to have dry mouth and dry eye symptoms.  She states she has intermittent Raynauds symptoms with the colder weather.  She has been using over-the-counter products for sicca symptoms which are helpful.  We had detailed discussion regarding other options for over-the-counter products.  She denies any shortness of breath or palpitations.  Increased risk of ILD and lymphoma with Sjogren's was discussed.  Increased risk of fetal cardiac conduction abnormalities with positive Ro antibodies were discussed.  Patient states that she may consider pregnancy in the future.  Need for Plaquenil use during pregnancy to prevent cardiac conduction abnormality was discussed.  Follow-up obtain autoimmune labs today.  Will contact her once the lab results are available.  Patient declined pilocarpine.  Plan: Urinalysis, Routine w reflex microscopic, Anti-DNA antibody, double-stranded, C3 and C4, ANA, Sedimentation rate, Sjogrens syndrome-A extractable nuclear antibody, Beta-2 glycoprotein antibodies, Cardiolipin antibodies, IgG, IgM, IgA, Lupus Anticoagulant Eval w/Reflex  Raynaud's disease without gangrene-she had no sclerodactyly, Telangiectasia or nailbed capillary changes.  She had good capillary refill.  Positive ANA (antinuclear antibody) -she had end of hospital Sav[facility] to body positive in the past.  Plan: Beta-2 glycoprotein antibodies, Cardiolipin antibodies, IgG, IgM, IgA, Lupus Anticoagulant Eval w/Reflex  Other fatigue  -she continues to have fatigue.  Plan: CBC with Differential/Platelet, COMPLETE METABOLIC PANEL WITH GFR, Serum protein electrophoresis with reflex  Fibromyalgia -she complains of generalized pain and discomfort.  She also gives history of intermittent pain and discomfort in her hands.  No swelling was noted.  History of generalized pain, fatigue, myalgias and arthralgias after pregnancy.  She was diagnosed with fibromyalgia in 2015 while she was in [place].  Elevated blood pressure reading-blood pressure was elevated at 152/101 today.  Repeat blood pressure was 147/94.  Patient was advised to monitor blood pressure closely and follow-up with the PCP.  Weight gain-patient states she is not able to exercise much.  She has been walking some.  She has not participated in the martial arts like before.  She has gained 60 pounds over the last 2 years.  She states she has lymphedema and has been going to lymphedema clinic.  She had no pitting edema today.  Other medical problems are listed as follows:  Prediabetes  Dysmenorrhea  Hx of migraines  History of IBS-she has been having diarrhea.  Recent stool cultures were negative.  She has an appointment coming up with the gastroenterologist.  Anxiety and depression  Herpes labialis  Orders: Orders Placed This Encounter  Procedures   Urinalysis, Routine w reflex microscopic   CBC with Differential/Platelet   COMPLETE METABOLIC PANEL WITH GFR   Anti-DNA antibody, double-stranded   C3 and C4   ANA   Sedimentation rate   Sjogrens syndrome-A extractable nuclear antibody   Serum protein electrophoresis with reflex   Beta-2 glycoprotein antibodies   Cardiolipin antibodies, IgG, IgM, IgA   Lupus Anticoagulant Eval w/Reflex   No orders of the defined types were placed in this encounter.    Follow-Up Instructions: Return in about 6 months (around 08/30/2023) for Sjogren's.   Pollyann Savoy, MD  Note - This record has been created using  Animal nutritionist.  Chart creation errors have been sought, but may not always  have been located. Such creation errors do not reflect on  the standard of medical care.

## 2023-02-15 MED ORDER — VALACYCLOVIR HCL 500 MG PO TABS
500.0000 mg | ORAL_TABLET | Freq: Every day | ORAL | 0 refills | Status: DC
Start: 2023-02-15 — End: 2023-08-30

## 2023-02-15 NOTE — Addendum Note (Signed)
Addended by: Doreene Nest on: 02/15/2023 02:00 PM   Modules accepted: Orders

## 2023-02-15 NOTE — Telephone Encounter (Signed)
Called and spoke with patient she states she has outbreaks 2-3 times a month. Advised of the need to start 3 month course of preventative Valtrex then use as needed. Patient is agreeable.

## 2023-02-15 NOTE — Telephone Encounter (Signed)
Please call patient:  How often is she experiencing outbreaks?  If more than 3-4 times annually then we need to start daily preventative treatment with Valtrex x 3 months then stop and use as needed.

## 2023-02-15 NOTE — Telephone Encounter (Signed)
Noted.  Prescription sent to pharmacy for Valtrex 500 mg.  Take 1 tablet by mouth once daily for 90 days, then as needed.

## 2023-02-24 DIAGNOSIS — Z419 Encounter for procedure for purposes other than remedying health state, unspecified: Secondary | ICD-10-CM | POA: Diagnosis not present

## 2023-02-26 DIAGNOSIS — F32A Depression, unspecified: Secondary | ICD-10-CM | POA: Diagnosis not present

## 2023-02-26 DIAGNOSIS — F411 Generalized anxiety disorder: Secondary | ICD-10-CM | POA: Diagnosis not present

## 2023-02-27 DIAGNOSIS — G5601 Carpal tunnel syndrome, right upper limb: Secondary | ICD-10-CM | POA: Diagnosis not present

## 2023-02-28 ENCOUNTER — Encounter: Payer: Self-pay | Admitting: Rheumatology

## 2023-02-28 ENCOUNTER — Ambulatory Visit: Payer: Medicaid Other | Attending: Rheumatology | Admitting: Rheumatology

## 2023-02-28 VITALS — BP 147/94 | HR 80 | Resp 16 | Ht 65.0 in | Wt 230.6 lb

## 2023-02-28 DIAGNOSIS — R5383 Other fatigue: Secondary | ICD-10-CM

## 2023-02-28 DIAGNOSIS — Z8669 Personal history of other diseases of the nervous system and sense organs: Secondary | ICD-10-CM | POA: Diagnosis not present

## 2023-02-28 DIAGNOSIS — F419 Anxiety disorder, unspecified: Secondary | ICD-10-CM

## 2023-02-28 DIAGNOSIS — B001 Herpesviral vesicular dermatitis: Secondary | ICD-10-CM

## 2023-02-28 DIAGNOSIS — R768 Other specified abnormal immunological findings in serum: Secondary | ICD-10-CM

## 2023-02-28 DIAGNOSIS — M797 Fibromyalgia: Secondary | ICD-10-CM | POA: Diagnosis not present

## 2023-02-28 DIAGNOSIS — R635 Abnormal weight gain: Secondary | ICD-10-CM

## 2023-02-28 DIAGNOSIS — R7303 Prediabetes: Secondary | ICD-10-CM | POA: Diagnosis not present

## 2023-02-28 DIAGNOSIS — M3501 Sicca syndrome with keratoconjunctivitis: Secondary | ICD-10-CM

## 2023-02-28 DIAGNOSIS — N946 Dysmenorrhea, unspecified: Secondary | ICD-10-CM

## 2023-02-28 DIAGNOSIS — Z8719 Personal history of other diseases of the digestive system: Secondary | ICD-10-CM

## 2023-02-28 DIAGNOSIS — R030 Elevated blood-pressure reading, without diagnosis of hypertension: Secondary | ICD-10-CM | POA: Diagnosis not present

## 2023-02-28 DIAGNOSIS — I7300 Raynaud's syndrome without gangrene: Secondary | ICD-10-CM | POA: Diagnosis not present

## 2023-02-28 DIAGNOSIS — F32A Depression, unspecified: Secondary | ICD-10-CM

## 2023-02-28 LAB — CBC WITH DIFFERENTIAL/PLATELET
HCT: 38.9 % (ref 35.0–45.0)
Hemoglobin: 12.7 g/dL (ref 11.7–15.5)
MPV: 9.5 fL (ref 7.5–12.5)
RBC: 4.68 10*6/uL (ref 3.80–5.10)
RDW: 14 % (ref 11.0–15.0)

## 2023-03-01 LAB — URINALYSIS, ROUTINE W REFLEX MICROSCOPIC
Bacteria, UA: NONE SEEN /HPF
Glucose, UA: NEGATIVE

## 2023-03-01 LAB — COMPLETE METABOLIC PANEL WITH GFR: Total Bilirubin: 0.5 mg/dL (ref 0.2–1.2)

## 2023-03-01 LAB — CBC WITH DIFFERENTIAL/PLATELET
Lymphs Abs: 3012 cells/uL (ref 850–3900)
WBC: 9.1 10*3/uL (ref 3.8–10.8)

## 2023-03-01 LAB — C3 AND C4: C3 Complement: 162 mg/dL (ref 83–193)

## 2023-03-02 LAB — CBC WITH DIFFERENTIAL/PLATELET
Basophils Absolute: 82 cells/uL (ref 0–200)
Neutrophils Relative %: 59.1 %

## 2023-03-02 LAB — COMPLETE METABOLIC PANEL WITH GFR
AST: 20 U/L (ref 10–30)
Globulin: 2.8 g/dL (calc) (ref 1.9–3.7)
Sodium: 139 mmol/L (ref 135–146)

## 2023-03-02 LAB — ANTI-DNA ANTIBODY, DOUBLE-STRANDED: ds DNA Ab: <1 IU/mL

## 2023-03-02 LAB — URINALYSIS, ROUTINE W REFLEX MICROSCOPIC
Bilirubin Urine: NEGATIVE
Specific Gravity, Urine: 1.017 (ref 1.001–1.035)
pH: 6 (ref 5.0–8.0)

## 2023-03-02 LAB — ANTI-NUCLEAR AB-TITER (ANA TITER): ANA Titer 1: 1:1280 {titer} — ABNORMAL HIGH

## 2023-03-02 LAB — C3 AND C4: C4 Complement: 26 mg/dL (ref 15–57)

## 2023-03-03 LAB — URINALYSIS, ROUTINE W REFLEX MICROSCOPIC
Ketones, ur: NEGATIVE
Leukocytes,Ua: NEGATIVE
Nitrite: NEGATIVE
WBC, UA: NONE SEEN /HPF (ref 0–5)

## 2023-03-03 LAB — CBC WITH DIFFERENTIAL/PLATELET
Absolute Monocytes: 473 cells/uL (ref 200–950)
Basophils Relative: 0.9 %
Eosinophils Absolute: 155 cells/uL (ref 15–500)
Monocytes Relative: 5.2 %
Neutro Abs: 5378 cells/uL (ref 1500–7800)
Total Lymphocyte: 33.1 %

## 2023-03-03 LAB — COMPLETE METABOLIC PANEL WITH GFR
ALT: 11 U/L (ref 6–29)
Alkaline phosphatase (APISO): 65 U/L (ref 31–125)

## 2023-03-03 LAB — PROTEIN ELECTROPHORESIS, SERUM, WITH REFLEX
Albumin ELP: 4.5 g/dL (ref 3.8–4.8)
Alpha 1: 0.3 g/dL (ref 0.2–0.3)
Gamma Globulin: 1.1 g/dL (ref 0.8–1.7)

## 2023-03-03 LAB — SJOGRENS SYNDROME-A EXTRACTABLE NUCLEAR ANTIBODY: SSA (Ro) (ENA) Antibody, IgG: 5.7 AI — AB

## 2023-03-04 LAB — CBC WITH DIFFERENTIAL/PLATELET
Eosinophils Relative: 1.7 %
MCH: 27.1 pg (ref 27.0–33.0)
MCHC: 32.6 g/dL (ref 32.0–36.0)
MCV: 83.1 fL (ref 80.0–100.0)
Platelets: 383 10*3/uL (ref 140–400)

## 2023-03-04 LAB — PROTEIN ELECTROPHORESIS, SERUM, WITH REFLEX
Alpha 2: 0.7 g/dL (ref 0.5–0.9)
Beta 2: 0.3 g/dL (ref 0.2–0.5)
Beta Globulin: 0.5 g/dL (ref 0.4–0.6)
Total Protein: 7.4 g/dL (ref 6.1–8.1)

## 2023-03-04 LAB — COMPLETE METABOLIC PANEL WITH GFR
AG Ratio: 1.6 (calc) (ref 1.0–2.5)
Albumin: 4.4 g/dL (ref 3.6–5.1)
BUN/Creatinine Ratio: 7 (calc) (ref 6–22)
BUN: 6 mg/dL — ABNORMAL LOW (ref 7–25)
CO2: 23 mmol/L (ref 20–32)
Calcium: 9.2 mg/dL (ref 8.6–10.2)
Chloride: 103 mmol/L (ref 98–110)
Creat: 0.87 mg/dL (ref 0.50–0.97)
Glucose, Bld: 78 mg/dL (ref 65–99)
Potassium: 4 mmol/L (ref 3.5–5.3)
Total Protein: 7.2 g/dL (ref 6.1–8.1)
eGFR: 89 mL/min/{1.73_m2} (ref >=60)

## 2023-03-04 LAB — BETA-2 GLYCOPROTEIN ANTIBODIES
Beta-2 Glyco 1 IgA: <2 U/mL (ref <20.0)
Beta-2 Glyco 1 IgM: <2 U/mL (ref <20.0)
Beta-2 Glyco I IgG: <2 U/mL (ref <20.0)

## 2023-03-04 LAB — ANA: Anti Nuclear Antibody (ANA): POSITIVE — AB

## 2023-03-04 LAB — LUPUS ANTICOAGULANT EVAL W/ REFLEX
PTT-LA Screen: 37 s (ref <=40)
dRVVT: 34 s (ref <=45)

## 2023-03-04 LAB — SEDIMENTATION RATE: Sed Rate: 11 mm/h (ref 0–20)

## 2023-03-04 LAB — CARDIOLIPIN ANTIBODIES, IGG, IGM, IGA
Anticardiolipin IgA: <2 APL-U/mL (ref <20.0)
Anticardiolipin IgG: <2 GPL-U/mL (ref <20.0)
Anticardiolipin IgM: <2 MPL-U/mL (ref <20.0)

## 2023-03-04 LAB — MICROSCOPIC MESSAGE

## 2023-03-04 LAB — URINALYSIS, ROUTINE W REFLEX MICROSCOPIC
Hgb urine dipstick: NEGATIVE
Hyaline Cast: NONE SEEN /LPF
RBC / HPF: NONE SEEN /HPF (ref 0–2)

## 2023-03-04 LAB — ANTI-NUCLEAR AB-TITER (ANA TITER)

## 2023-03-04 NOTE — Progress Notes (Signed)
ANA is 1: 1280, SS A (Sjogren's antibody positive, RNP positive, double-stranded DNA negative, anticardiolipin negative, beta-2 GP 1 negative, lupus anticoagulant negative, C3-C4 normal, UA protein trace positive (not significant) CBC, CMP and sed rate normal.  SPEP normal.  Labs do not indicate an autoimmune disease flare.

## 2023-03-06 ENCOUNTER — Encounter: Payer: Self-pay | Admitting: Primary Care

## 2023-03-06 ENCOUNTER — Ambulatory Visit (INDEPENDENT_AMBULATORY_CARE_PROVIDER_SITE_OTHER): Payer: Medicaid Other | Admitting: Primary Care

## 2023-03-06 VITALS — BP 144/94 | HR 90 | Temp 98.2°F | Ht 65.0 in | Wt 230.0 lb

## 2023-03-06 DIAGNOSIS — I10 Essential (primary) hypertension: Secondary | ICD-10-CM | POA: Diagnosis not present

## 2023-03-06 MED ORDER — HYDROCHLOROTHIAZIDE 12.5 MG PO TABS
12.5000 mg | ORAL_TABLET | Freq: Every day | ORAL | 0 refills | Status: AC
Start: 2023-03-06 — End: ?

## 2023-03-06 NOTE — Progress Notes (Signed)
Subjective:    Patient ID: Nichole Mcbride, female    DOB: 27-Apr-1986, 37 y.o.   MRN: [id number]  Hypertension Associated symptoms include headaches.    Nichole Mcbride is a very pleasant 37 y.o. female with a history of Raynaud's disease, migraines, Sjogren's disease, prediabetes, lymphedema, fibromyalgia who presents today to discuss elevated blood pressure readings.  She has been checking her blood pressure at home over the last 2 months which has been running in the 150s-160/90s-100s mostly.  She's checking her BP on both her mother and father's BP machines. Both machines are running about the same.   She was at her rheumatologist's office last week and her BP was 147/94, this was one hour after recheck. Initial BP was 152/101.  Her rheumatologist checked labs to rule out Sjogren's flare, labs did not show an active flare.  She is not sure if she is experiencing headaches from her blood pressure or her typical headaches.  She also struggles with chronic fatigue.  Her lower extremity swelling has improved some, but she continues to notice pitting.   BP Readings from Last 3 Encounters:  03/06/23 (!) 144/94  02/28/23 (!) 147/94  01/17/23 134/78   Wt Readings from Last 3 Encounters:  03/06/23 230 lb (104.3 kg)  02/28/23 230 lb 9.6 oz (104.6 kg)  01/17/23 229 lb (103.9 kg)   She has cut back on salty foods for months given her lower extremity swelling. She is more active now, participates in yoga and is getting back into jujitsu.  The only changes in her medication regimen have been norethindrone 0.35 mg daily.  She began this regimen about 2 months ago.  She denies chest pain, chest pain upon deep inspiration, exertional dyspnea.   Review of Systems  Constitutional:  Positive for fatigue.  Cardiovascular:  Positive for leg swelling.  Neurological:  Positive for headaches.         Past Medical History:  Diagnosis Date   Anxiety    Blunt trauma of face  01/25/2021   Carpal tunnel syndrome    bilaterally   DEPRESSION 06/10/2008   Hashimoto's thyroiditis    INSOMNIA 06/10/2008   Lymphedema    dx by vascular, per patient   Migraine headache    Raynaud phenomenon    Rib pain on right side 04/29/2020   SCOLIOSIS 06/10/2008   Sjogren's disease (HCC)    STD (sexually transmitted disease)    hsv oral   Supervision of other normal pregnancy, antepartum 07/09/2017   Clinic  [facility]  Prenatal Labs  Dating     Blood type:     Genetic Screen  1 Screen:    AFP:     Quad:     NIPS:  Antibody:   Anatomic [place]     Rubella:   Varicella:    GTT  Early:               Third trimester:   RPR:     Rhogam     HBsAg:     TDaP vaccine                        Flu Shot:  HIV:     Baby Food                                  GBS:   Contraception     Pap:  CBB  CS/VBAC          Social History   Socioeconomic History   Marital status: Divorced    Spouse name: Not on file   Number of children: Not on file   Years of education: Not on file   Highest education level: Some college, no degree  Occupational History   Not on file  Tobacco Use   Smoking status: Former    Types: Cigarettes    Passive exposure: Past   Smokeless tobacco: Never  Vaping Use   Vaping Use: Former  Substance and Sexual Activity   Alcohol use: Not Currently   Drug use: Never   Sexual activity: Yes    Partners: Male    Birth control/protection: Condom    Comment: HSV 1+  Other Topics Concern   Not on file  Social History Narrative   Married.   2 children.   Teaches English.   Enjoys spending time with family.   Social Determinants of Health   Financial Resource Strain: Low Risk  (01/08/2023)   Overall Financial Resource Strain (CARDIA)    Difficulty of Paying Living Expenses: Not very hard  Food Insecurity: No Food Insecurity (01/08/2023)   Hunger Vital Sign    Worried About Running Out of Food in the Last Year: Never true    Ran Out of Food in the Last Year: Never true   Transportation Needs: No Transportation Needs (01/08/2023)   PRAPARE - Administrator, Civil Service (Medical): No    Lack of Transportation (Non-Medical): No  Physical Activity: Insufficiently Active (01/08/2023)   Exercise Vital Sign    Days of Exercise per Week: 3 days    Minutes of Exercise per Session: 40 min  Stress: Stress Concern Present (01/08/2023)   [employer] of Occupational Health - Occupational Stress Questionnaire    Feeling of Stress : To some extent  Social Connections: Moderately Isolated (01/08/2023)   Social Connection and Isolation Panel [NHANES]    Frequency of Communication with Friends and Family: More than three times a week    Frequency of Social Gatherings with Friends and Family: More than three times a week    Attends Religious Services: 1 to 4 times per year    Active Member of [employer] or Organizations: No    Attends Engineer, structural: Not on file    Marital Status: Divorced  Intimate Partner Violence: Not on file    Past Surgical History:  Procedure Laterality Date   INTRAUTERINE DEVICE INSERTION  11/05/2009    Family History  Problem Relation Age of Onset   Thyroid disease Mother    Transient ischemic attack Mother        x3   Diabetes Mother    Hypertension Mother    Hypothyroidism Mother    Depression Mother    Thyroid disease Father    Hypertension Father    Hypothyroidism Father    Rheum arthritis Father    Depression Brother    Cancer Paternal Grandfather    Bipolar disorder Cousin     Allergies  Allergen Reactions   Ciprofloxacin Hcl Other (See Comments)    unknown   Imitrex [Sumatriptan Succinate] Diarrhea and Nausea And Vomiting    severe   Sumatriptan Other (See Comments)    "tightness in my chest and dizziness"    Current Outpatient Medications on File Prior to Visit  Medication Sig Dispense Refill   cyclobenzaprine (FLEXERIL) 5 MG tablet Take 1 tablet (5 mg total) by  mouth 2 (two) times daily  as needed (migraines). 30 tablet 0   DULoxetine (CYMBALTA) 30 MG capsule TAKE 1 CAPSULE(30 MG) BY MOUTH DAILY FOR ANXIETY OR DEPRESSION OR PAIN 90 capsule 2   Erenumab-aooe (AIMOVIG) 140 MG/ML SOAJ Inject 140 mg into the skin every 30 (thirty) days.     ibuprofen (ADVIL) 400 MG tablet Take 400 mg by mouth every 6 (six) hours as needed.     norethindrone (ORTHO MICRONOR) 0.35 MG tablet Take 1 tablet (0.35 mg total) by mouth daily. 84 tablet 1   ondansetron (ZOFRAN-ODT) 4 MG disintegrating tablet Take 1 tablet (4 mg total) by mouth every 8 (eight) hours as needed for nausea or vomiting. 20 tablet 0   rizatriptan (MAXALT) 10 MG tablet Take 1 tablet by mouth at migraine onset, May repeat in 2 hours if needed 10 tablet 0   SELENIUM PO Take by mouth daily.     valACYclovir (VALTREX) 500 MG tablet Take 1 tablet (500 mg total) by mouth daily. For outbreak prevention. 90 tablet 0   No current facility-administered medications on file prior to visit.    BP (!) 144/94   Pulse 90   Temp 98.2 F (36.8 C) (Temporal)   Ht 5\' 5"  (1.651 m)   Wt 230 lb (104.3 kg)   LMP 02/20/2023 (Exact Date)   SpO2 98%   BMI 38.27 kg/m  Objective:   Physical Exam Cardiovascular:     Rate and Rhythm: Normal rate and regular rhythm.     Comments: Minimal lower extremity swelling noted.  No pitting. Pulmonary:     Effort: Pulmonary effort is normal.     Breath sounds: Normal breath sounds.  Musculoskeletal:     Cervical back: Neck supple.  Skin:    General: Skin is warm and dry.           Assessment & Plan:  Primary hypertension Assessment & Plan: Unclear cause, but she has put on 30 pounds since last August. Also significant family history of hypertension. Lower suspicion that her birth control is contributing, did not see side effects of hypertension within medication adverse reactions list. Lower suspicion for PE.  Given her ongoing lower extremity swelling, coupled with her blood pressure readings  recently, will treat.  Start hydrochlorothiazide 12.5 mg daily. Will plan to see her back in the office in 2 to 3 weeks for blood pressure check and BMP.    Orders: -     hydroCHLOROthiazide; Take 1 tablet (12.5 mg total) by mouth daily. for blood pressure.  Dispense: 30 tablet; Refill: 0        Doreene Nest, NP

## 2023-03-06 NOTE — Assessment & Plan Note (Signed)
Unclear cause, but she has put on 30 pounds since last August. Also significant family history of hypertension. Lower suspicion that her birth control is contributing, did not see side effects of hypertension within medication adverse reactions list. Lower suspicion for PE.  Given her ongoing lower extremity swelling, coupled with her blood pressure readings recently, will treat.  Start hydrochlorothiazide 12.5 mg daily. Will plan to see her back in the office in 2 to 3 weeks for blood pressure check and BMP.

## 2023-03-07 DIAGNOSIS — G5601 Carpal tunnel syndrome, right upper limb: Secondary | ICD-10-CM | POA: Diagnosis not present

## 2023-03-26 DIAGNOSIS — Z419 Encounter for procedure for purposes other than remedying health state, unspecified: Secondary | ICD-10-CM | POA: Diagnosis not present

## 2023-03-28 ENCOUNTER — Ambulatory Visit: Payer: Medicaid Other | Admitting: Primary Care

## 2023-03-30 ENCOUNTER — Encounter: Payer: Self-pay | Admitting: Primary Care

## 2023-04-04 ENCOUNTER — Ambulatory Visit (INDEPENDENT_AMBULATORY_CARE_PROVIDER_SITE_OTHER): Payer: Medicaid Other | Admitting: Primary Care

## 2023-04-04 ENCOUNTER — Encounter: Payer: Self-pay | Admitting: Primary Care

## 2023-04-04 ENCOUNTER — Other Ambulatory Visit: Payer: Self-pay | Admitting: Primary Care

## 2023-04-04 VITALS — BP 124/90 | HR 80 | Temp 98.2°F | Ht 65.0 in | Wt 220.0 lb

## 2023-04-04 DIAGNOSIS — E876 Hypokalemia: Secondary | ICD-10-CM

## 2023-04-04 DIAGNOSIS — I10 Essential (primary) hypertension: Secondary | ICD-10-CM

## 2023-04-04 LAB — BASIC METABOLIC PANEL
BUN: 8 mg/dL (ref 6–23)
CO2: 26 mEq/L (ref 19–32)
Calcium: 9 mg/dL (ref 8.4–10.5)
Chloride: 100 mEq/L (ref 96–112)
Creatinine, Ser: 0.89 mg/dL (ref 0.40–1.20)
GFR: 84.09 mL/min (ref >60.00)
Glucose, Bld: 99 mg/dL (ref 70–99)
Potassium: 3.2 mEq/L — ABNORMAL LOW (ref 3.5–5.1)
Sodium: 135 mEq/L (ref 135–145)

## 2023-04-04 MED ORDER — POTASSIUM CHLORIDE CRYS ER 20 MEQ PO TBCR
20.0000 meq | EXTENDED_RELEASE_TABLET | Freq: Every day | ORAL | 0 refills | Status: DC
Start: 2023-04-04 — End: 2024-02-13

## 2023-04-04 NOTE — Progress Notes (Signed)
Subjective:    Patient ID: Nichole Mcbride, female    DOB: Aug 23, 1986, 37 y.o.   MRN: [id number]  HPI  Nichole Mcbride is a very pleasant 37 y.o. female with a history of hypertension, migraines, Raynaud's disease, Sjogren's disease, fibromyalgia, lymphedema who presents today for follow-up of hypertension.  She was last evaluated on 03/06/2023 for persistent elevated blood pressure readings ranging 150-160/90-100.  Blood pressure in our office was slightly above goal at 144/94.  Given her family history, coupled with weight gain and lower extremity edema, we initiated hydrochlorothiazide 12.5 mg daily.  She is here for follow-up today.  Since her last visit she is compliant to hydrochlorothiazide 12.5 mg daily. She is checking BP at home which is running 137/100, 130/94, 131/103, 126/107, 138/97.   She has noticed a reduction in her hand and finger swelling. She denies dizziness. She continues to experience daily headaches. She resumed her Aimovig about 2 months ago.   BP Readings from Last 3 Encounters:  04/04/23 (!) 124/90  03/06/23 (!) 144/94  02/28/23 (!) 147/94   Wt Readings from Last 3 Encounters:  04/04/23 220 lb (99.8 kg)  03/06/23 230 lb (104.3 kg)  02/28/23 230 lb 9.6 oz (104.6 kg)      Review of Systems  Respiratory:  Negative for shortness of breath.   Cardiovascular:  Negative for chest pain.  Neurological:  Positive for headaches. Negative for dizziness.         Past Medical History:  Diagnosis Date   Anxiety    Blunt trauma of face 01/25/2021   Carpal tunnel syndrome    bilaterally   DEPRESSION 06/10/2008   Hashimoto's thyroiditis    INSOMNIA 06/10/2008   Lymphedema    dx by vascular, per patient   Migraine headache    Raynaud phenomenon    Rib pain on right side 04/29/2020   SCOLIOSIS 06/10/2008   Sjogren's disease (HCC)    STD (sexually transmitted disease)    hsv oral   Supervision of other normal pregnancy, antepartum  07/09/2017   Clinic  [facility]  Prenatal Labs  Dating     Blood type:     Genetic Screen  1 Screen:    AFP:     Quad:     NIPS:  Antibody:   Anatomic [place]     Rubella:   Varicella:    GTT  Early:               Third trimester:   RPR:     Rhogam     HBsAg:     TDaP vaccine                        Flu Shot:  HIV:     Baby Food                                  GBS:   Contraception     Pap:  CBB         CS/VBAC          Social History   Socioeconomic History   Marital status: Divorced    Spouse name: Not on file   Number of children: Not on file   Years of education: Not on file   Highest education level: Some college, no degree  Occupational History   Not on file  Tobacco Use   Smoking status:  Former    Types: Cigarettes    Passive exposure: Past   Smokeless tobacco: Never  Vaping Use   Vaping Use: Former  Substance and Sexual Activity   Alcohol use: Not Currently   Drug use: Never   Sexual activity: Yes    Partners: Male    Birth control/protection: Condom    Comment: HSV 1+  Other Topics Concern   Not on file  Social History Narrative   Married.   2 children.   Teaches English.   Enjoys spending time with family.   Social Determinants of Health   Financial Resource Strain: Low Risk  (01/08/2023)   Overall Financial Resource Strain (CARDIA)    Difficulty of Paying Living Expenses: Not very hard  Food Insecurity: No Food Insecurity (01/08/2023)   Hunger Vital Sign    Worried About Running Out of Food in the Last Year: Never true    Ran Out of Food in the Last Year: Never true  Transportation Needs: No Transportation Needs (01/08/2023)   PRAPARE - Administrator, Civil Service (Medical): No    Lack of Transportation (Non-Medical): No  Physical Activity: Insufficiently Active (01/08/2023)   Exercise Vital Sign    Days of Exercise per Week: 3 days    Minutes of Exercise per Session: 40 min  Stress: Stress Concern Present (01/08/2023)   [employer] of  Occupational Health - Occupational Stress Questionnaire    Feeling of Stress : To some extent  Social Connections: Moderately Isolated (01/08/2023)   Social Connection and Isolation Panel [NHANES]    Frequency of Communication with Friends and Family: More than three times a week    Frequency of Social Gatherings with Friends and Family: More than three times a week    Attends Religious Services: 1 to 4 times per year    Active Member of [employer] or Organizations: No    Attends Engineer, structural: Not on file    Marital Status: Divorced  Intimate Partner Violence: Not on file    Past Surgical History:  Procedure Laterality Date   INTRAUTERINE DEVICE INSERTION  11/05/2009    Family History  Problem Relation Age of Onset   Thyroid disease Mother    Transient ischemic attack Mother        x3   Diabetes Mother    Hypertension Mother    Hypothyroidism Mother    Depression Mother    Thyroid disease Father    Hypertension Father    Hypothyroidism Father    Rheum arthritis Father    Depression Brother    Cancer Paternal Grandfather    Bipolar disorder Cousin     Allergies  Allergen Reactions   Ciprofloxacin Hcl Other (See Comments)    unknown   Imitrex [Sumatriptan Succinate] Diarrhea and Nausea And Vomiting    severe   Sumatriptan Other (See Comments)    "tightness in my chest and dizziness"    Current Outpatient Medications on File Prior to Visit  Medication Sig Dispense Refill   cyclobenzaprine (FLEXERIL) 5 MG tablet Take 1 tablet (5 mg total) by mouth 2 (two) times daily as needed (migraines). 30 tablet 0   DULoxetine (CYMBALTA) 30 MG capsule TAKE 1 CAPSULE(30 MG) BY MOUTH DAILY FOR ANXIETY OR DEPRESSION OR PAIN 90 capsule 2   Erenumab-aooe (AIMOVIG) 140 MG/ML SOAJ Inject 140 mg into the skin every 30 (thirty) days.     hydrochlorothiazide (HYDRODIURIL) 12.5 MG tablet Take 1 tablet (12.5 mg total) by mouth daily.  for blood pressure. 30 tablet 0   ibuprofen  (ADVIL) 400 MG tablet Take 400 mg by mouth every 6 (six) hours as needed.     norethindrone (ORTHO MICRONOR) 0.35 MG tablet Take 1 tablet (0.35 mg total) by mouth daily. 84 tablet 1   ondansetron (ZOFRAN-ODT) 4 MG disintegrating tablet Take 1 tablet (4 mg total) by mouth every 8 (eight) hours as needed for nausea or vomiting. 20 tablet 0   rizatriptan (MAXALT) 10 MG tablet Take 1 tablet by mouth at migraine onset, May repeat in 2 hours if needed 10 tablet 0   SELENIUM PO Take by mouth daily.     valACYclovir (VALTREX) 500 MG tablet Take 1 tablet (500 mg total) by mouth daily. For outbreak prevention. 90 tablet 0   No current facility-administered medications on file prior to visit.    BP (!) 124/90   Pulse 80   Temp 98.2 F (36.8 C) (Temporal)   Ht 5\' 5"  (1.651 m)   Wt 220 lb (99.8 kg)   LMP 03/23/2023 (Exact Date)   SpO2 97%   BMI 36.61 kg/m  Objective:   Physical Exam Cardiovascular:     Rate and Rhythm: Normal rate and regular rhythm.     Comments: No lower extremity swelling noted Pulmonary:     Effort: Pulmonary effort is normal.  Musculoskeletal:     Cervical back: Neck supple.  Skin:    General: Skin is warm and dry.           Assessment & Plan:  Primary hypertension Assessment & Plan: Improved.   Commended her on 10 pound weight loss, encouraged to continue.  For now, we will continue hydrochlorothiazide 12.5 mg daily while she works on lifestyle changes. BMP pending.  She will update if blood pressure at another provider's office is above goal or similar to what she is seeing at home.  Orders: -     Basic metabolic panel        Doreene Nest, NP

## 2023-04-04 NOTE — Patient Instructions (Addendum)
Stop by the lab prior to leaving today. I will notify you of your results once received.   Continue taking hydrochlorothiazide 12.5 mg daily for blood pressure.  It was a pleasure to see you today!

## 2023-04-04 NOTE — Assessment & Plan Note (Signed)
Improved.   Commended her on 10 pound weight loss, encouraged to continue.  For now, we will continue hydrochlorothiazide 12.5 mg daily while she works on lifestyle changes. BMP pending.  She will update if blood pressure at another provider's office is above goal or similar to what she is seeing at home.

## 2023-04-05 MED ORDER — VALSARTAN 80 MG PO TABS
80.0000 mg | ORAL_TABLET | Freq: Every day | ORAL | 0 refills | Status: DC
Start: 2023-04-05 — End: 2023-07-12

## 2023-04-25 ENCOUNTER — Telehealth: Payer: Self-pay | Admitting: Primary Care

## 2023-04-25 NOTE — Telephone Encounter (Signed)
Patient called to make lab appointment, I searched for orders but could not find any. Asked patient if she knew what Jae Dire wanted to recheck, she advised she wanted to recheck potassium after last labs on 7/10. I could not find notes or orders for this, so wanted to check and see if Jae Dire was wanting to recheck labs after office visit on 7/10? I made lab appointment for patient, if it is not needed I will call patient to cancel appointment. Thanks!

## 2023-04-26 DIAGNOSIS — Z419 Encounter for procedure for purposes other than remedying health state, unspecified: Secondary | ICD-10-CM | POA: Diagnosis not present

## 2023-04-26 NOTE — Telephone Encounter (Signed)
Thank you, you are correct!  She was notified on MyChart on 04/04/23 to schedule a follow-up in office visit for blood pressure and to recheck kidney function and potassium.  Please change lab appointment in office visit.

## 2023-04-26 NOTE — Telephone Encounter (Signed)
Got it. I have changed patient from lab only to office visit on 8/9. Thanks!

## 2023-05-04 ENCOUNTER — Encounter: Payer: Self-pay | Admitting: Primary Care

## 2023-05-04 ENCOUNTER — Other Ambulatory Visit: Payer: Medicaid Other

## 2023-05-04 ENCOUNTER — Ambulatory Visit (INDEPENDENT_AMBULATORY_CARE_PROVIDER_SITE_OTHER): Payer: Medicaid Other | Admitting: Primary Care

## 2023-05-04 VITALS — BP 108/80 | HR 79 | Temp 97.5°F | Ht 65.0 in | Wt 219.0 lb

## 2023-05-04 DIAGNOSIS — I10 Essential (primary) hypertension: Secondary | ICD-10-CM

## 2023-05-04 DIAGNOSIS — G43009 Migraine without aura, not intractable, without status migrainosus: Secondary | ICD-10-CM

## 2023-05-04 LAB — BASIC METABOLIC PANEL
BUN: 7 mg/dL (ref 6–23)
CO2: 25 mEq/L (ref 19–32)
Calcium: 9 mg/dL (ref 8.4–10.5)
Chloride: 101 mEq/L (ref 96–112)
Creatinine, Ser: 0.89 mg/dL (ref 0.40–1.20)
GFR: 84.04 mL/min (ref >60.00)
Glucose, Bld: 104 mg/dL — ABNORMAL HIGH (ref 70–99)
Potassium: 3.9 mEq/L (ref 3.5–5.1)
Sodium: 134 mEq/L — ABNORMAL LOW (ref 135–145)

## 2023-05-04 MED ORDER — TIZANIDINE HCL 4 MG PO TABS
4.0000 mg | ORAL_TABLET | Freq: Every day | ORAL | 0 refills | Status: DC | PRN
Start: 2023-05-04 — End: 2023-11-13

## 2023-05-04 NOTE — Assessment & Plan Note (Signed)
Well-controlled.  Continue valsartan 80 mg daily. BMP pending.

## 2023-05-04 NOTE — Patient Instructions (Signed)
Stop by the lab prior to leaving today. I will notify you of your results once received.   It was a pleasure to see you today!  

## 2023-05-04 NOTE — Assessment & Plan Note (Signed)
Improved.  Stop cyclobenzaprine muscle relaxer. Start tizanidine 4 mg as needed for migraines and neck tension.  Continue Aimovig 140 mg injected every 30 days per neurology.

## 2023-05-04 NOTE — Progress Notes (Signed)
Subjective:    Patient ID: Nichole Mcbride, female    DOB: [birth year], 37 y.o.   MRN: [id number]  Hypertension Pertinent negatives include no chest pain or headaches.    Nichole Mcbride is a very pleasant 37 y.o. female with a history of hypertension, fibromyalgia, Sjogren's disease, lymphedema, migraines, GAD, MDD who presents today for follow-up of hypertension.  She would also like to change her muscle relaxer to tizanidine from cyclobenzaprine as she finds it more effective for migraines and muscle relaxation from her neck.  She was last evaluated on 04/04/2023 for follow-up of hypertension since management on hydrochlorothiazide 12.5 mg daily.  Blood pressure was under control.  Lab results that day revealed hypokalemia which was suspected to be secondary to thiazide diuretic.  Given this her hydrochlorothiazide was discontinued and she was initiated on valsartan 80 mg daily.  She is here today for follow-up.  Since her last visit she is compliant to valsartan 80 mg daily. She has not checking BP at home. She denies dizziness. Her daily headaches have improved.  Home stressors have also improved.  She recently returned from a trip to the beach with her family, had a great time.  BP Readings from Last 3 Encounters:  05/04/23 108/80  04/04/23 (!) 124/90  03/06/23 (!) 144/94      Review of Systems  Cardiovascular:  Negative for chest pain.  Musculoskeletal:  Negative for myalgias.  Neurological:  Negative for dizziness and headaches.         Past Medical History:  Diagnosis Date   Anxiety    Blunt trauma of face 01/25/2021   Carpal tunnel syndrome    bilaterally   DEPRESSION 06/10/2008   Hashimoto's thyroiditis    INSOMNIA 06/10/2008   Lymphedema    dx by vascular, per patient   Migraine headache    Raynaud phenomenon    Rib pain on right side 04/29/2020   SCOLIOSIS 06/10/2008   Sjogren's disease (HCC)    STD (sexually transmitted disease)    hsv  oral   Supervision of other normal pregnancy, antepartum 07/09/2017   Clinic  [facility]  Prenatal Labs  Dating     Blood type:     Genetic Screen  1 Screen:    AFP:     Quad:     NIPS:  Antibody:   Anatomic [place]     Rubella:   Varicella:    GTT  Early:               Third trimester:   RPR:     Rhogam     HBsAg:     TDaP vaccine                        Flu Shot:  HIV:     Baby Food                                  GBS:   Contraception     Pap:  CBB         CS/VBAC          Social History   Socioeconomic History   Marital status: Divorced    Spouse name: Not on file   Number of children: Not on file   Years of education: Not on file   Highest education level: Some college, no degree  Occupational History   Not on  file  Tobacco Use   Smoking status: Former    Types: Cigarettes    Passive exposure: Past   Smokeless tobacco: Never  Vaping Use   Vaping status: Former  Substance and Sexual Activity   Alcohol use: Not Currently   Drug use: Never   Sexual activity: Yes    Partners: Male    Birth control/protection: Condom    Comment: HSV 1+  Other Topics Concern   Not on file  Social History Narrative   Married.   2 children.   Teaches English.   Enjoys spending time with family.   Social Determinants of Health   Financial Resource Strain: Low Risk  (01/08/2023)   Overall Financial Resource Strain (CARDIA)    Difficulty of Paying Living Expenses: Not very hard  Food Insecurity: No Food Insecurity (01/08/2023)   Hunger Vital Sign    Worried About Running Out of Food in the Last Year: Never true    Ran Out of Food in the Last Year: Never true  Transportation Needs: No Transportation Needs (01/08/2023)   PRAPARE - Administrator, Civil Service (Medical): No    Lack of Transportation (Non-Medical): No  Physical Activity: Insufficiently Active (01/08/2023)   Exercise Vital Sign    Days of Exercise per Week: 3 days    Minutes of Exercise per Session: 40 min  Stress: Stress  Concern Present (01/08/2023)   [employer] of Occupational Health - Occupational Stress Questionnaire    Feeling of Stress : To some extent  Social Connections: Moderately Isolated (01/08/2023)   Social Connection and Isolation Panel [NHANES]    Frequency of Communication with Friends and Family: More than three times a week    Frequency of Social Gatherings with Friends and Family: More than three times a week    Attends Religious Services: 1 to 4 times per year    Active Member of [employer] or Organizations: No    Attends Engineer, structural: Not on file    Marital Status: Divorced  Intimate Partner Violence: Not on file    Past Surgical History:  Procedure Laterality Date   INTRAUTERINE DEVICE INSERTION  11/05/2009    Family History  Problem Relation Age of Onset   Thyroid disease Mother    Transient ischemic attack Mother        x3   Diabetes Mother    Hypertension Mother    Hypothyroidism Mother    Depression Mother    Thyroid disease Father    Hypertension Father    Hypothyroidism Father    Rheum arthritis Father    Depression Brother    Cancer Paternal Grandfather    Bipolar disorder Cousin     Allergies  Allergen Reactions   Ciprofloxacin Hcl Other (See Comments)    unknown   Imitrex [Sumatriptan Succinate] Diarrhea and Nausea And Vomiting    severe   Sumatriptan Other (See Comments)    "tightness in my chest and dizziness"    Current Outpatient Medications on File Prior to Visit  Medication Sig Dispense Refill   DULoxetine (CYMBALTA) 30 MG capsule TAKE 1 CAPSULE(30 MG) BY MOUTH DAILY FOR ANXIETY OR DEPRESSION OR PAIN 90 capsule 2   Erenumab-aooe (AIMOVIG) 140 MG/ML SOAJ Inject 140 mg into the skin every 30 (thirty) days.     ibuprofen (ADVIL) 400 MG tablet Take 400 mg by mouth every 6 (six) hours as needed.     norethindrone (ORTHO MICRONOR) 0.35 MG tablet Take 1 tablet (0.35 mg  total) by mouth daily. 84 tablet 1   ondansetron (ZOFRAN-ODT) 4 MG  disintegrating tablet Take 1 tablet (4 mg total) by mouth every 8 (eight) hours as needed for nausea or vomiting. 20 tablet 0   potassium chloride SA (KLOR-CON M) 20 MEQ tablet Take 1 tablet (20 mEq total) by mouth daily. For low potassium. 5 tablet 0   rizatriptan (MAXALT) 10 MG tablet Take 1 tablet by mouth at migraine onset, May repeat in 2 hours if needed 10 tablet 0   SELENIUM PO Take by mouth daily.     valACYclovir (VALTREX) 500 MG tablet Take 1 tablet (500 mg total) by mouth daily. For outbreak prevention. 90 tablet 0   valsartan (DIOVAN) 80 MG tablet Take 1 tablet (80 mg total) by mouth daily. for blood pressure. 90 tablet 0   No current facility-administered medications on file prior to visit.    BP 108/80 (BP Location: Right Arm, Patient Position: Sitting, Cuff Size: Large)   Pulse 79   Temp (!) 97.5 F (36.4 C)   Ht 5\' 5"  (1.651 m)   Wt 219 lb (99.3 kg)   LMP 03/23/2023 (Exact Date)   SpO2 98%   BMI 36.44 kg/m  Objective:   Physical Exam Cardiovascular:     Rate and Rhythm: Normal rate and regular rhythm.  Pulmonary:     Effort: Pulmonary effort is normal.     Breath sounds: Normal breath sounds.  Musculoskeletal:     Cervical back: Neck supple.  Skin:    General: Skin is warm and dry.           Assessment & Plan:  Migraine without aura and without status migrainosus, not intractable Assessment & Plan: Improved.  Stop cyclobenzaprine muscle relaxer. Start tizanidine 4 mg as needed for migraines and neck tension.  Continue Aimovig 140 mg injected every 30 days per neurology.  Orders: -     tiZANidine HCl; Take 1 tablet (4 mg total) by mouth daily as needed for muscle spasms. And migraines  Dispense: 30 tablet; Refill: 0  Primary hypertension Assessment & Plan: Well-controlled.  Continue valsartan 80 mg daily. BMP pending.  Orders: -     Basic metabolic panel        Doreene Nest, NP

## 2023-05-10 ENCOUNTER — Ambulatory Visit: Payer: Medicaid Other | Admitting: Gastroenterology

## 2023-05-20 NOTE — Progress Notes (Unsigned)
Virtual Visit via Video Note  I connected with [name] on 05/21/23 at 12:10 pm by a video enabled telemedicine application and verified that I am speaking with the correct person using two identifiers.   I discussed the limitations of evaluation and management by telemedicine and the availability of in person appointments. The patient expressed understanding and agreed to proceed.   -Location of the patient : car -Location of the provider : Office -The names of all persons participating in the telemedicine service : Pt and myself      Name: Nichole Mcbride  MRN/ DOB: [id number], 1985-10-25    Age/ Sex: 37 y.o., female    PCP: Doreene Nest, NP   Reason for Endocrinology Evaluation: Hashimoto's Disease      Date of Initial Endocrinology Evaluation: 05/10/2022    HPI: [name] is a 37 y.o. female with a past medical history of Sjogren's disease, depression, fibromylagia . The patient presented for initial endocrinology clinic visit on 05/10/2022 for consultative assistance with her Hashimoto's disease .   Pt with elevated TPO Ab's at 169 IU/mL but normal FT4 and TSH   She sees rheumatology for Sjogren's disease but not on treatment  No Biotin   She has fatigue and occasional sleep issues  LMP ~ 2 weeks ago , regular   Bother parents with thyroid disease  Has 2 boys 9 and 12     SUBJECTIVE:    Today (05/20/23):  Nichole Mcbride is here for a follow up on Hashimoto's Thyroiditis.   She denies any local neck symptoms She has been noted with low to no energy, she does endorse poor quality of sleep with either difficulty falling asleep or maintaining sleep She continues with alternating constipation and diarrhea  Denies tremors  She is on MVI and selenium   HISTORY:  Past Medical History:  Past Medical History:  Diagnosis Date   Anxiety    Blunt trauma of face 01/25/2021   Carpal tunnel syndrome    bilaterally    DEPRESSION 06/10/2008   Hashimoto's thyroiditis    INSOMNIA 06/10/2008   Lymphedema    dx by vascular, per patient   Migraine headache    Raynaud phenomenon    Rib pain on right side 04/29/2020   SCOLIOSIS 06/10/2008   Sjogren's disease (HCC)    STD (sexually transmitted disease)    hsv oral   Supervision of other normal pregnancy, antepartum 07/09/2017   Clinic  [facility]  Prenatal Labs  Dating     Blood type:     Genetic Screen  1 Screen:    AFP:     Quad:     NIPS:  Antibody:   Anatomic [place]     Rubella:   Varicella:    GTT  Early:               Third trimester:   RPR:     Rhogam     HBsAg:     TDaP vaccine                        Flu Shot:  HIV:     Baby Food                                  GBS:   Contraception     Pap:  CBB  CS/VBAC         Past Surgical History:  Past Surgical History:  Procedure Laterality Date   INTRAUTERINE DEVICE INSERTION  11/05/2009    Social History:  reports that she has quit smoking. Her smoking use included cigarettes. She has been exposed to tobacco smoke. She has never used smokeless tobacco. She reports that she does not currently use alcohol. She reports that she does not use drugs. Family History: family history includes Bipolar disorder in her cousin; Cancer in her paternal grandfather; Depression in her brother and mother; Diabetes in her mother; Hypertension in her father and mother; Hypothyroidism in her father and mother; Rheum arthritis in her father; Thyroid disease in her father and mother; Transient ischemic attack in her mother.   HOME MEDICATIONS: Allergies as of 05/21/2023       Reactions   Ciprofloxacin Hcl Other (See Comments)   unknown   Imitrex [sumatriptan Succinate] Diarrhea, Nausea And Vomiting   severe   Sumatriptan Other (See Comments)   "tightness in my chest and dizziness"        Medication List        Accurate as of May 20, 2023  7:29 PM. If you have any questions, ask your nurse or doctor.           Aimovig 140 MG/ML Soaj Generic drug: Erenumab-aooe Inject 140 mg into the skin every 30 (thirty) days.   DULoxetine 30 MG capsule Commonly known as: CYMBALTA TAKE 1 CAPSULE(30 MG) BY MOUTH DAILY FOR ANXIETY OR DEPRESSION OR PAIN   ibuprofen 400 MG tablet Commonly known as: ADVIL Take 400 mg by mouth every 6 (six) hours as needed.   norethindrone 0.35 MG tablet Commonly known as: Ortho Micronor Take 1 tablet (0.35 mg total) by mouth daily.   ondansetron 4 MG disintegrating tablet Commonly known as: ZOFRAN-ODT Take 1 tablet (4 mg total) by mouth every 8 (eight) hours as needed for nausea or vomiting.   potassium chloride SA 20 MEQ tablet Commonly known as: KLOR-CON M Take 1 tablet (20 mEq total) by mouth daily. For low potassium.   rizatriptan 10 MG tablet Commonly known as: MAXALT Take 1 tablet by mouth at migraine onset, May repeat in 2 hours if needed   SELENIUM PO Take by mouth daily.   tiZANidine 4 MG tablet Commonly known as: Zanaflex Take 1 tablet (4 mg total) by mouth daily as needed for muscle spasms. And migraines   valACYclovir 500 MG tablet Commonly known as: Valtrex Take 1 tablet (500 mg total) by mouth daily. For outbreak prevention.   valsartan 80 MG tablet Commonly known as: DIOVAN Take 1 tablet (80 mg total) by mouth daily. for blood pressure.          REVIEW OF SYSTEMS: A comprehensive ROS was conducted with the patient and is negative except as per HPI    OBJECTIVE:  VS: There were no vitals taken for this visit.   Wt Readings from Last 3 Encounters:  05/04/23 219 lb (99.3 kg)  04/04/23 220 lb (99.8 kg)  03/06/23 230 lb (104.3 kg)     EXAM: General: Pt appears well and is in NAD  Mental Status: Judgment, insight: Intact Orientation: Oriented to time, place, and person Mood and affect: No depression, anxiety, or agitation     DATA REVIEWED:  Latest Reference Range & Units 05/10/22 15:29  TSH 0.35 - 5.50 uIU/mL 1.88   Triiodothyronine (T3) 76 - 181 ng/dL 161  W9,UEAV(WUJWJX) 9.14 - 1.60 ng/dL  0.75    Thyroid Ultrasound 05/11/2022   Estimated total number of nodules >/= 1 cm: 0   Number of spongiform nodules >/=  2 cm not described below (TR1): 0   Number of mixed cystic and solid nodules >/= 1.5 cm not described below (TR2): 0   _________________________________________________________   No discrete nodules are seen within the thyroid gland. Normal vascularity.   IMPRESSION: Mildly heterogeneous thyroid gland without evidence of discrete thyroid nodule.     ASSESSMENT/PLAN/RECOMMENDATIONS:   Hashimoto's Disease:   -Patient with fatigue -No recent local neck symptoms - Thyroid ultrasound was unrevealing 04/2022 -This was a virtual visit, patient will have TFTs drawn at a local [facility]     F/U in 1 yr     Signed electronically by: Lyndle Herrlich, MD  [facility] Endocrinology  [facility] [address] [place], [place] [address] Phone: [phone] FAX: [phone]   CC: Doreene Nest, NP [address] [place] [address] Phone: [phone] Fax: [phone]   Return to Endocrinology clinic as below: Future Appointments  Date Time Provider Department Center  05/21/2023 12:10 PM Ludwika Rodd, Konrad Dolores, MD [facility] None  07/03/2023  1:15 PM Midge Minium, MD [facility] None  07/09/2023  9:15 AM Gilda Crease, Latina Craver, MD [facility] None  08/30/2023  9:50 AM Gearldine Bienenstock, PA-C [facility] None

## 2023-05-21 ENCOUNTER — Telehealth (INDEPENDENT_AMBULATORY_CARE_PROVIDER_SITE_OTHER): Payer: Medicaid Other | Admitting: Internal Medicine

## 2023-05-21 ENCOUNTER — Encounter: Payer: Self-pay | Admitting: Internal Medicine

## 2023-05-21 ENCOUNTER — Other Ambulatory Visit: Payer: Self-pay

## 2023-05-21 VITALS — Ht 65.0 in

## 2023-05-21 DIAGNOSIS — E063 Autoimmune thyroiditis: Secondary | ICD-10-CM

## 2023-05-25 DIAGNOSIS — E063 Autoimmune thyroiditis: Secondary | ICD-10-CM | POA: Diagnosis not present

## 2023-05-26 LAB — T4, FREE: Free T4: 0.94 ng/dL (ref 0.82–1.77)

## 2023-05-26 LAB — T3, FREE: T3, Free: 2.7 pg/mL (ref 2.0–4.4)

## 2023-05-26 LAB — TSH: TSH: 2.76 u[IU]/mL (ref 0.450–4.500)

## 2023-05-27 DIAGNOSIS — Z419 Encounter for procedure for purposes other than remedying health state, unspecified: Secondary | ICD-10-CM | POA: Diagnosis not present

## 2023-06-11 ENCOUNTER — Other Ambulatory Visit: Payer: Self-pay | Admitting: Nurse Practitioner

## 2023-06-11 DIAGNOSIS — N939 Abnormal uterine and vaginal bleeding, unspecified: Secondary | ICD-10-CM

## 2023-06-11 NOTE — Telephone Encounter (Signed)
Med refill request:Micronor Last AEX: 12/26/22 Next AEX: not scheduled Last MMG (if hormonal med) n/a Refill authorized: Please Advise, #[phone], 1 RF

## 2023-06-11 NOTE — Telephone Encounter (Signed)
Asked front o schedule pt for AEX for further refills

## 2023-06-14 ENCOUNTER — Telehealth: Payer: Self-pay

## 2023-06-14 NOTE — Telephone Encounter (Signed)
-----   Message from Davis S sent at 06/11/2023 11:52 AM EDT ----- done ----- Message ----- From: Melrose Nakayama, CMA Sent: 06/11/2023  11:08 AM EDT To: [facility] Appointments  Please schedule pt for AEX with TW

## 2023-06-14 NOTE — Telephone Encounter (Signed)
Pt scheduled 09/13/23 for AEX with TW

## 2023-06-26 DIAGNOSIS — Z419 Encounter for procedure for purposes other than remedying health state, unspecified: Secondary | ICD-10-CM | POA: Diagnosis not present

## 2023-07-03 ENCOUNTER — Encounter: Payer: Self-pay | Admitting: Gastroenterology

## 2023-07-03 ENCOUNTER — Ambulatory Visit (INDEPENDENT_AMBULATORY_CARE_PROVIDER_SITE_OTHER): Payer: Medicaid Other | Admitting: Gastroenterology

## 2023-07-03 VITALS — BP 111/77 | HR 81 | Temp 98.4°F | Ht 65.0 in | Wt 206.0 lb

## 2023-07-03 DIAGNOSIS — K580 Irritable bowel syndrome with diarrhea: Secondary | ICD-10-CM | POA: Diagnosis not present

## 2023-07-03 MED ORDER — OMEPRAZOLE 20 MG PO CPDR: 20.0000 mg | DELAYED_RELEASE_CAPSULE | Freq: Every day | ORAL | 2 refills | Status: DC

## 2023-07-03 NOTE — Progress Notes (Signed)
Gastroenterology Consultation  Referring Provider:     Doreene Nest, NP Primary Care Physician:  Doreene Nest, NP Primary Gastroenterologist:  Dr. Servando Snare     Reason for Consultation:     Diarrhea        HPI:   Nichole Mcbride is a 37 y.o. y/o female referred for consultation & management of diarrhea by Dr. Chestine Spore, Keane Scrape, NP.  This patient comes in today with a history of fibromyalgia migraines, Sjogren's syndrome anxiety and depression.  The patient reports that she has had diarrhea since she was a child.  She states that her parents were told that she should avoid meat and that she had IBS.  The patient also reports that she avoids milk products.  She does state that she suffers from reflux and usually has a reflux at night.  She was taking amitriptyline at some time for her migraine headaches but does not recall if the amitriptyline helped her diarrhea and abdominal symptoms.  She has her gallbladder and reports that her diarrhea will occur 30 minutes after eating.  She states that she has heard of a low FODMAP diet but has not tried it.  She denies any unexplained weight loss fevers chills nausea or vomiting.  She is losing weight on a diet now.    Past Medical History:  Diagnosis Date   Anxiety    Blunt trauma of face 01/25/2021   Carpal tunnel syndrome    bilaterally   DEPRESSION 06/10/2008   Hashimoto's thyroiditis    INSOMNIA 06/10/2008   Lymphedema    dx by vascular, per patient   Migraine headache    Raynaud phenomenon    Rib pain on right side 04/29/2020   SCOLIOSIS 06/10/2008   Sjogren's disease (HCC)    STD (sexually transmitted disease)    hsv oral   Supervision of other normal pregnancy, antepartum 07/09/2017   Clinic  [facility]  Prenatal Labs  Dating     Blood type:     Genetic Screen  1 Screen:    AFP:     Quad:     NIPS:  Antibody:   Anatomic [place]     Rubella:   Varicella:    GTT  Early:               Third trimester:   RPR:     Rhogam      HBsAg:     TDaP vaccine                        Flu Shot:  HIV:     Baby Food                                  GBS:   Contraception     Pap:  CBB         CS/VBAC          Past Surgical History:  Procedure Laterality Date   INTRAUTERINE DEVICE INSERTION  11/05/2009    Prior to Admission medications   Medication Sig Start Date End Date Taking? Authorizing Provider  DULoxetine (CYMBALTA) 30 MG capsule TAKE 1 CAPSULE(30 MG) BY MOUTH DAILY FOR ANXIETY OR DEPRESSION OR PAIN 10/09/22   Doreene Nest, NP  Erenumab-aooe (AIMOVIG) 140 MG/ML SOAJ Inject 140 mg into the skin every 30 (thirty) days.    [provider]  escitalopram Judye Bos)  10 MG tablet Take 10 mg by mouth daily. 05/15/23   [provider]  ibuprofen (ADVIL) 400 MG tablet Take 400 mg by mouth every 6 (six) hours as needed.    [provider]  norethindrone (MICRONOR) 0.35 MG tablet TAKE 1 TABLET BY MOUTH EVERY DAY 06/11/23   Wyline Beady A, NP  ondansetron (ZOFRAN-ODT) 4 MG disintegrating tablet Take 1 tablet (4 mg total) by mouth every 8 (eight) hours as needed for nausea or vomiting. 01/19/23   Doreene Nest, NP  potassium chloride SA (KLOR-CON M) 20 MEQ tablet Take 1 tablet (20 mEq total) by mouth daily. For low potassium. 04/04/23   Doreene Nest, NP  rizatriptan (MAXALT) 10 MG tablet Take 1 tablet by mouth at migraine onset, May repeat in 2 hours if needed 07/11/22   Doreene Nest, NP  SELENIUM PO Take by mouth daily.    [provider]  tiZANidine (ZANAFLEX) 4 MG tablet Take 1 tablet (4 mg total) by mouth daily as needed for muscle spasms. And migraines 05/04/23   Doreene Nest, NP  valACYclovir (VALTREX) 500 MG tablet Take 1 tablet (500 mg total) by mouth daily. For outbreak prevention. 02/15/23   Doreene Nest, NP  valsartan (DIOVAN) 80 MG tablet Take 1 tablet (80 mg total) by mouth daily. for blood pressure. 04/05/23   Doreene Nest, NP    Family History  Problem  Relation Age of Onset   Thyroid disease Mother    Transient ischemic attack Mother        x3   Diabetes Mother    Hypertension Mother    Hypothyroidism Mother    Depression Mother    Thyroid disease Father    Hypertension Father    Hypothyroidism Father    Rheum arthritis Father    Depression Brother    Cancer Paternal Grandfather    Bipolar disorder Cousin      Social History   Tobacco Use   Smoking status: Former    Types: Cigarettes    Passive exposure: Past   Smokeless tobacco: Never  Vaping Use   Vaping status: Former  Substance Use Topics   Alcohol use: Not Currently   Drug use: Never    Allergies as of 07/03/2023 - Review Complete 05/21/2023  Allergen Reaction Noted   Ciprofloxacin hcl Other (See Comments) 09/18/2016   Imitrex [sumatriptan succinate] Diarrhea and Nausea And Vomiting 09/25/2011   Sumatriptan Other (See Comments) 09/25/2011    Review of Systems:    All systems reviewed and negative except where noted in HPI.   Physical Exam:  There were no vitals taken for this visit. No LMP recorded. General:   Alert,  Well-developed, well-nourished, pleasant and cooperative in NAD Head:  Normocephalic and atraumatic. Eyes:  Sclera clear, no icterus.   Conjunctiva pink. Ears:  Normal auditory acuity. Neck:  Supple; no masses or thyromegaly. Lungs:  Respirations even and unlabored.  Clear throughout to auscultation.   No wheezes, crackles, or rhonchi. No acute distress. Heart:  Regular rate and rhythm; no murmurs, clicks, rubs, or gallops. Abdomen:  Normal bowel sounds.  No bruits.  Soft, non-tender and non-distended without masses, hepatosplenomegaly or hernias noted.  No guarding or rebound tenderness.  Negative Carnett sign.   Rectal:  Deferred.  Pulses:  Normal pulses noted. Extremities:  No clubbing or edema.  No cyanosis. Neurologic:  Alert and oriented x3;  grossly normal neurologically. Skin:  Intact without significant lesions or rashes.  No  jaundice. Lymph Nodes:  No significant cervical adenopathy. Psych:  Alert and cooperative. Normal mood and affect.  Imaging Studies: No results found.  Assessment and Plan:   Nichole Mcbride is a 37 y.o. y/o female who comes in today with signs and symptoms of irritable bowel syndrome that has been going on for many years.  The patient is on multiple medications for anxiety and depression and I am hesitant to start her on any TCA at this time.  The patient patient has been told to start omeprazole 20 mg a day for her frequent nighttime heartburn.  The patient will also take Imodium as needed and has been educated about a low FODMAP diet for her irritable bowel syndrome.  The patient has been explained the plan and agrees with it.    Midge Minium, MD. Clementeen Graham    Note: This dictation was prepared with Dragon dictation along with smaller phrase technology. Any transcriptional errors that result from this process are unintentional.

## 2023-07-08 NOTE — Progress Notes (Deleted)
MRN : [id number]  Nichole Mcbride is a 37 y.o. (07/01/1986) female who presents with chief complaint of legs swell.  History of Present Illness:   The patient returns to the office for followup evaluation regarding leg swelling.  The swelling has persisted and the pain associated with swelling continues. There have not been any interval development of a ulcerations or wounds.  Since the previous visit the patient has been wearing graduated compression stockings and has noted little if any improvement in the lymphedema. The patient has been using compression routinely morning until night.  The patient also states elevation during the day and exercise is being done too.  No outpatient medications have been marked as taking for the 07/09/23 encounter (Appointment) with Gilda Crease, Latina Craver, MD.    Past Medical History:  Diagnosis Date   Anxiety    Blunt trauma of face 01/25/2021   Carpal tunnel syndrome    bilaterally   DEPRESSION 06/10/2008   Hashimoto's thyroiditis    INSOMNIA 06/10/2008   Lymphedema    dx by vascular, per patient   Migraine headache    Raynaud phenomenon    Rib pain on right side 04/29/2020   SCOLIOSIS 06/10/2008   Sjogren's disease (HCC)    STD (sexually transmitted disease)    hsv oral   Supervision of other normal pregnancy, antepartum 07/09/2017   Clinic  [facility]  Prenatal Labs  Dating     Blood type:     Genetic Screen  1 Screen:    AFP:     Quad:     NIPS:  Antibody:   Anatomic [place]     Rubella:   Varicella:    GTT  Early:               Third trimester:   RPR:     Rhogam     HBsAg:     TDaP vaccine                        Flu Shot:  HIV:     Baby Food                                  GBS:   Contraception     Pap:  CBB         CS/VBAC          Past Surgical History:  Procedure Laterality Date   INTRAUTERINE DEVICE INSERTION  11/05/2009    Social History Social History   Tobacco Use   Smoking status: Former     Types: Cigarettes    Passive exposure: Past   Smokeless tobacco: Never  Vaping Use   Vaping status: Former  Substance Use Topics   Alcohol use: Not Currently   Drug use: Never    Family History Family History  Problem Relation Age of Onset   Thyroid disease Mother    Transient ischemic attack Mother        x3   Diabetes Mother    Hypertension Mother    Hypothyroidism Mother    Depression Mother    Thyroid disease Father    Hypertension Father    Hypothyroidism Father    Rheum arthritis Father    Depression Brother  Cancer Paternal Grandfather    Bipolar disorder Cousin     Allergies  Allergen Reactions   Ciprofloxacin Hcl Other (See Comments)    unknown   Imitrex [Sumatriptan Succinate] Diarrhea and Nausea And Vomiting    severe   Sumatriptan Other (See Comments)    "tightness in my chest and dizziness"     REVIEW OF SYSTEMS (Negative unless checked)  Constitutional: [] Weight loss  [] Fever  [] Chills Cardiac: [] Chest pain   [] Chest pressure   [] Palpitations   [] Shortness of breath when laying flat   [] Shortness of breath with exertion. Vascular:  [] Pain in legs with walking   [x] Pain in legs with standing  [] History of DVT   [] Phlebitis   [x] Swelling in legs   [] Varicose veins   [] Non-healing ulcers Pulmonary:   [] Uses home oxygen   [] Productive cough   [] Hemoptysis   [] Wheeze  [] COPD   [] Asthma Neurologic:  [] Dizziness   [] Seizures   [] History of stroke   [] History of TIA  [] Aphasia   [] Vissual changes   [] Weakness or numbness in arm   [] Weakness or numbness in leg Musculoskeletal:   [] Joint swelling   [] Joint pain   [] Low back pain Hematologic:  [] Easy bruising  [] Easy bleeding   [] Hypercoagulable state   [] Anemic Gastrointestinal:  [] Diarrhea   [] Vomiting  [] Gastroesophageal reflux/heartburn   [] Difficulty swallowing. Genitourinary:  [] Chronic kidney disease   [] Difficult urination  [] Frequent urination   [] Blood in urine Skin:  [] Rashes   [] Ulcers   Psychological:  [x] History of anxiety   [x]  History of major depression.  Physical Examination  There were no vitals filed for this visit. There is no height or weight on file to calculate BMI. Gen: WD/WN, NAD Head: [place]/AT, No temporalis wasting.  Ear/Nose/Throat: Hearing grossly intact, nares w/o erythema or drainage, pinna without lesions Eyes: PER, EOMI, sclera nonicteric.  Neck: Supple, no gross masses.  No JVD.  Pulmonary:  Good air movement, no audible wheezing, no use of accessory muscles.  Cardiac: RRR, precordium not hyperdynamic. Vascular:  scattered varicosities present bilaterally.  Mild venous stasis changes to the legs bilaterally.  3-4+ soft pitting edema, CEAP [id number]  Vessel Right Left  Radial Palpable Palpable  Gastrointestinal: soft, non-distended. No guarding/no peritoneal signs.  Musculoskeletal: M/S 5/5 throughout.  No deformity.  Neurologic: CN 2-12 intact. Pain and light touch intact in extremities.  Symmetrical.  Speech is fluent. Motor exam as listed above. Psychiatric: Judgment intact, Mood & affect appropriate for pt's clinical situation. Dermatologic: Venous rashes no ulcers noted.  No changes consistent with cellulitis. Lymph : No lichenification or skin changes of chronic lymphedema.  CBC Lab Results  Component Value Date   WBC 9.1 02/28/2023   HGB 12.7 02/28/2023   HCT 38.9 02/28/2023   MCV 83.1 02/28/2023   PLT 383 02/28/2023    BMET    Component Value Date/Time   NA 134 (L) 05/04/2023 0927   NA 141 09/20/2011 1608   K 3.9 05/04/2023 0927   CL 101 05/04/2023 0927   CO2 25 05/04/2023 0927   GLUCOSE 104 (H) 05/04/2023 0927   BUN 7 05/04/2023 0927   BUN 11 09/20/2011 1608   CREATININE 0.89 05/04/2023 0927   CREATININE 0.87 02/28/2023 1538   CALCIUM 9.0 05/04/2023 0927   GFRNONAA 118 09/20/2011 1608   GFRAA 137 09/20/2011 1608   CrCl cannot be calculated (Patient's most recent lab result is older than the maximum 21 days  allowed.).  COAG No results found for: "INR", "PROTIME"  Radiology No results found.   Assessment/Plan There are no diagnoses linked to this encounter.   Levora Dredge, MD  07/08/2023 4:51 PM

## 2023-07-09 ENCOUNTER — Ambulatory Visit (INDEPENDENT_AMBULATORY_CARE_PROVIDER_SITE_OTHER): Payer: Medicaid Other | Admitting: Vascular Surgery

## 2023-07-09 ENCOUNTER — Encounter (INDEPENDENT_AMBULATORY_CARE_PROVIDER_SITE_OTHER): Payer: Self-pay

## 2023-07-09 DIAGNOSIS — I890 Lymphedema, not elsewhere classified: Secondary | ICD-10-CM

## 2023-07-09 DIAGNOSIS — I10 Essential (primary) hypertension: Secondary | ICD-10-CM

## 2023-07-09 DIAGNOSIS — I7300 Raynaud's syndrome without gangrene: Secondary | ICD-10-CM

## 2023-07-12 ENCOUNTER — Other Ambulatory Visit: Payer: Self-pay | Admitting: Primary Care

## 2023-07-12 DIAGNOSIS — I10 Essential (primary) hypertension: Secondary | ICD-10-CM

## 2023-07-12 MED ORDER — VALSARTAN 80 MG PO TABS: 80.0000 mg | ORAL_TABLET | Freq: Every day | ORAL | 2 refills | Status: DC

## 2023-07-15 ENCOUNTER — Other Ambulatory Visit: Payer: Self-pay | Admitting: Internal Medicine

## 2023-07-15 DIAGNOSIS — F32A Depression, unspecified: Secondary | ICD-10-CM

## 2023-07-15 DIAGNOSIS — M797 Fibromyalgia: Secondary | ICD-10-CM

## 2023-07-17 ENCOUNTER — Other Ambulatory Visit: Payer: Self-pay | Admitting: Primary Care

## 2023-07-17 DIAGNOSIS — F32A Depression, unspecified: Secondary | ICD-10-CM

## 2023-07-17 DIAGNOSIS — M797 Fibromyalgia: Secondary | ICD-10-CM

## 2023-07-17 DIAGNOSIS — F419 Anxiety disorder, unspecified: Secondary | ICD-10-CM

## 2023-07-17 NOTE — Telephone Encounter (Signed)
Requested by interface surescripts. Provider not at this practice.  Requested Prescriptions  Refused Prescriptions Disp Refills   DULoxetine (CYMBALTA) 30 MG capsule [Pharmacy Med Name: DULOXETINE DR 30MG  CAPSULES] 90 capsule 2    Sig: TAKE 1 CAPSULE(30 MG) BY MOUTH DAILY FOR ANXIETY OR DEPRESSION OR PAIN     There is no refill protocol information for this order

## 2023-07-18 NOTE — Telephone Encounter (Signed)
Called and spoke with patient, she states her psychiatrist prescribes Lexapro for her and they are aware she is taking Cymbalta as well.

## 2023-07-18 NOTE — Telephone Encounter (Signed)
Please call patient:  Received a request for her Cymbalta medication for which he prescribed for fibromyalgia.  I now see that she is on Lexapro.  Who is prescribing the Lexapro?  Does the prescriber know that she is taking both Cymbalta and Lexapro?

## 2023-07-18 NOTE — Telephone Encounter (Signed)
Noted  

## 2023-07-27 DIAGNOSIS — Z419 Encounter for procedure for purposes other than remedying health state, unspecified: Secondary | ICD-10-CM | POA: Diagnosis not present

## 2023-08-16 NOTE — Progress Notes (Unsigned)
Office Visit Note  Patient: Nichole Mcbride             Date of Birth: [birth year]           MRN: [id number]             PCP: Doreene Nest, NP Referring: Doreene Nest, NP Visit Date: 08/30/2023 Occupation: @GUAROCC @  Subjective:  No chief complaint on file.   History of Present Illness: Nichole Mcbride is a 37 y.o. female ***     Activities of Daily Living:  Patient reports morning stiffness for *** {minute/hour:19697}.   Patient {ACTIONS;DENIES/REPORTS:21021675::"Denies"} nocturnal pain.  Difficulty dressing/grooming: {ACTIONS;DENIES/REPORTS:21021675::"Denies"} Difficulty climbing stairs: {ACTIONS;DENIES/REPORTS:21021675::"Denies"} Difficulty getting out of chair: {ACTIONS;DENIES/REPORTS:21021675::"Denies"} Difficulty using hands for taps, buttons, cutlery, and/or writing: {ACTIONS;DENIES/REPORTS:21021675::"Denies"}  No Rheumatology ROS completed.   PMFS History:  Patient Active Problem List   Diagnosis Date Noted   Primary hypertension 03/06/2023   Sore throat 01/17/2023   Chronic diarrhea 01/09/2023   Lymphedema 01/07/2023   Swelling of lower extremity 11/02/2022   Abnormal uterine bleeding 09/14/2022   Class 2 obesity due to excess calories with body mass index (BMI) of 37.0 to 37.9 in adult 09/14/2022   Family history of thyroid disease 07/05/2021   Joint pain in both hands 02/15/2021   Generalized body aches 01/25/2021   MDD (major depressive disorder), recurrent episode, moderate (HCC) 08/08/2018   Prediabetes 01/25/2018   BMI 31.0-31.9,adult 07/09/2017   Rash and nonspecific skin eruption 06/15/2016   Herpes labialis 06/15/2016   Preventative health care 05/04/2016   Fibromyalgia 03/23/2016   Anxiety and depression 03/23/2016   Sjogren's disease (HCC) 03/23/2016   Migraines 10/14/2011   Raynaud's disease 09/20/2011   Acute cystitis with hematuria 06/10/2008   SCOLIOSIS 06/10/2008    Past Medical History:  Diagnosis Date    Anxiety    Blunt trauma of face 01/25/2021   Carpal tunnel syndrome    bilaterally   DEPRESSION 06/10/2008   Hashimoto's thyroiditis    INSOMNIA 06/10/2008   Lymphedema    dx by vascular, per patient   Migraine headache    Raynaud phenomenon    Rib pain on right side 04/29/2020   SCOLIOSIS 06/10/2008   Sjogren's disease (HCC)    STD (sexually transmitted disease)    hsv oral   Supervision of other normal pregnancy, antepartum 07/09/2017   Clinic  [facility]  Prenatal Labs  Dating     Blood type:     Genetic Screen  1 Screen:    AFP:     Quad:     NIPS:  Antibody:   Anatomic [place]     Rubella:   Varicella:    GTT  Early:               Third trimester:   RPR:     Rhogam     HBsAg:     TDaP vaccine                        Flu Shot:  HIV:     Baby Food                                  GBS:   Contraception     Pap:  CBB         CS/VBAC          Family History  Problem Relation Age of  Onset   Thyroid disease Mother    Transient ischemic attack Mother        x3   Diabetes Mother    Hypertension Mother    Hypothyroidism Mother    Depression Mother    Thyroid disease Father    Hypertension Father    Hypothyroidism Father    Rheum arthritis Father    Depression Brother    Cancer Paternal Grandfather    Bipolar disorder Cousin    Past Surgical History:  Procedure Laterality Date   INTRAUTERINE DEVICE INSERTION  11/05/2009   Social History   Social History Narrative   Married.   2 children.   Teaches English.   Enjoys spending time with family.   Immunization History  Administered Date(s) Administered   Hepatitis A, Adult 04/25/2012, 08/25/2015   Hepatitis B 07/11/1999, 09/30/1999, 01/23/2000   Influenza Whole 11/19/2012   Moderna Sars-Covid-2 Vaccination 02/26/2020, 03/18/2020   Td 07/28/2020   Tdap 04/06/2013     Objective: Vital Signs: There were no vitals taken for this visit.   Physical Exam   Musculoskeletal Exam: ***  CDAI Exam: CDAI Score: -- Patient Global:  --; Provider Global: -- Swollen: --; Tender: -- Joint Exam 08/30/2023   No joint exam has been documented for this visit   There is currently no information documented on the homunculus. Go to the Rheumatology activity and complete the homunculus joint exam.  Investigation: No additional findings.  Imaging: No results found.  Recent Labs: Lab Results  Component Value Date   WBC 9.1 02/28/2023   HGB 12.7 02/28/2023   PLT 383 02/28/2023   NA 134 (L) 05/04/2023   K 3.9 05/04/2023   CL 101 05/04/2023   CO2 25 05/04/2023   GLUCOSE 104 (H) 05/04/2023   BUN 7 05/04/2023   CREATININE 0.89 05/04/2023   BILITOT 0.5 02/28/2023   ALKPHOS 53 01/25/2021   AST 20 02/28/2023   ALT 11 02/28/2023   PROT 7.2 02/28/2023   PROT 7.4 02/28/2023   ALBUMIN 4.3 01/25/2021   CALCIUM 9.0 05/04/2023   GFRAA 137 09/20/2011    Speciality Comments: No specialty comments available.  Procedures:  No procedures performed Allergies: Ciprofloxacin hcl, Imitrex [sumatriptan succinate], and Sumatriptan   Assessment / Plan:     Visit Diagnoses: Sjogren's syndrome with keratoconjunctivitis sicca (HCC)  Raynaud's disease without gangrene  Positive ANA (antinuclear antibody)  Other fatigue  Fibromyalgia  Elevated blood pressure reading  Prediabetes  Weight gain  Dysmenorrhea  Hx of migraines  History of IBS  Anxiety and depression  Herpes labialis  Orders: No orders of the defined types were placed in this encounter.  No orders of the defined types were placed in this encounter.   Face-to-face time spent with patient was *** minutes. Greater than 50% of time was spent in counseling and coordination of care.  Follow-Up Instructions: No follow-ups on file.   Gearldine Bienenstock, PA-C  Note - This record has been created using Dragon software.  Chart creation errors have been sought, but may not always  have been located. Such creation errors do not reflect on  the standard of  medical care.

## 2023-08-26 DIAGNOSIS — Z419 Encounter for procedure for purposes other than remedying health state, unspecified: Secondary | ICD-10-CM | POA: Diagnosis not present

## 2023-08-28 ENCOUNTER — Ambulatory Visit: Payer: Medicaid Other | Admitting: Nurse Practitioner

## 2023-08-30 ENCOUNTER — Ambulatory Visit: Payer: Medicaid Other | Admitting: Physician Assistant

## 2023-08-30 DIAGNOSIS — Z8669 Personal history of other diseases of the nervous system and sense organs: Secondary | ICD-10-CM

## 2023-08-30 DIAGNOSIS — R5383 Other fatigue: Secondary | ICD-10-CM

## 2023-08-30 DIAGNOSIS — N946 Dysmenorrhea, unspecified: Secondary | ICD-10-CM

## 2023-08-30 DIAGNOSIS — R768 Other specified abnormal immunological findings in serum: Secondary | ICD-10-CM

## 2023-08-30 DIAGNOSIS — B001 Herpesviral vesicular dermatitis: Secondary | ICD-10-CM

## 2023-08-30 DIAGNOSIS — M3501 Sicca syndrome with keratoconjunctivitis: Secondary | ICD-10-CM

## 2023-08-30 DIAGNOSIS — R635 Abnormal weight gain: Secondary | ICD-10-CM

## 2023-08-30 DIAGNOSIS — Z8719 Personal history of other diseases of the digestive system: Secondary | ICD-10-CM

## 2023-08-30 DIAGNOSIS — F32A Depression, unspecified: Secondary | ICD-10-CM

## 2023-08-30 DIAGNOSIS — R7303 Prediabetes: Secondary | ICD-10-CM

## 2023-08-30 DIAGNOSIS — M797 Fibromyalgia: Secondary | ICD-10-CM

## 2023-08-30 DIAGNOSIS — R030 Elevated blood-pressure reading, without diagnosis of hypertension: Secondary | ICD-10-CM

## 2023-08-30 DIAGNOSIS — I7300 Raynaud's syndrome without gangrene: Secondary | ICD-10-CM

## 2023-08-30 MED ORDER — VALACYCLOVIR HCL 500 MG PO TABS
ORAL_TABLET | ORAL | 0 refills | Status: DC
Start: 2023-08-30 — End: 2024-02-13

## 2023-09-03 ENCOUNTER — Ambulatory Visit: Payer: Medicaid Other | Admitting: Nurse Practitioner

## 2023-09-03 NOTE — Progress Notes (Deleted)
Office Visit Note  Patient: Nichole Mcbride             Date of Birth: 09-28-1985           MRN: [id number]             PCP: Doreene Nest, NP Referring: Doreene Nest, NP Visit Date: 09/17/2023 Occupation: @GUAROCC @  Subjective:    History of Present Illness: Nichole Mcbride is a 37 y.o. female with history of sjogren's syndrome.    Activities of Daily Living:  Patient reports morning stiffness for *** {minute/hour:19697}.   Patient {ACTIONS;DENIES/REPORTS:21021675::"Denies"} nocturnal pain.  Difficulty dressing/grooming: {ACTIONS;DENIES/REPORTS:21021675::"Denies"} Difficulty climbing stairs: {ACTIONS;DENIES/REPORTS:21021675::"Denies"} Difficulty getting out of chair: {ACTIONS;DENIES/REPORTS:21021675::"Denies"} Difficulty using hands for taps, buttons, cutlery, and/or writing: {ACTIONS;DENIES/REPORTS:21021675::"Denies"}  No Rheumatology ROS completed.   PMFS History:  Patient Active Problem List   Diagnosis Date Noted   Primary hypertension 03/06/2023   Sore throat 01/17/2023   Chronic diarrhea 01/09/2023   Lymphedema 01/07/2023   Swelling of lower extremity 11/02/2022   Abnormal uterine bleeding 09/14/2022   Class 2 obesity due to excess calories with body mass index (BMI) of 37.0 to 37.9 in adult 09/14/2022   Family history of thyroid disease 07/05/2021   Joint pain in both hands 02/15/2021   Generalized body aches 01/25/2021   MDD (major depressive disorder), recurrent episode, moderate (HCC) 08/08/2018   Prediabetes 01/25/2018   BMI 31.0-31.9,adult 07/09/2017   Rash and nonspecific skin eruption 06/15/2016   Herpes labialis 06/15/2016   Preventative health care 05/04/2016   Fibromyalgia 03/23/2016   Anxiety and depression 03/23/2016   Sjogren's disease (HCC) 03/23/2016   Migraines 10/14/2011   Raynaud's disease 09/20/2011   Acute cystitis with hematuria 06/10/2008   SCOLIOSIS 06/10/2008    Past Medical History:  Diagnosis Date    Anxiety    Blunt trauma of face 01/25/2021   Carpal tunnel syndrome    bilaterally   DEPRESSION 06/10/2008   Hashimoto's thyroiditis    INSOMNIA 06/10/2008   Lymphedema    dx by vascular, per patient   Migraine headache    Raynaud phenomenon    Rib pain on right side 04/29/2020   SCOLIOSIS 06/10/2008   Sjogren's disease (HCC)    STD (sexually transmitted disease)    hsv oral   Supervision of other normal pregnancy, antepartum 07/09/2017   Clinic  [facility]  Prenatal Labs  Dating     Blood type:     Genetic Screen  1 Screen:    AFP:     Quad:     NIPS:  Antibody:   Anatomic [place]     Rubella:   Varicella:    GTT  Early:               Third trimester:   RPR:     Rhogam     HBsAg:     TDaP vaccine                        Flu Shot:  HIV:     Baby Food                                  GBS:   Contraception     Pap:  CBB         CS/VBAC          Family History  Problem Relation Age of Onset  Thyroid disease Mother    Transient ischemic attack Mother        x3   Diabetes Mother    Hypertension Mother    Hypothyroidism Mother    Depression Mother    Thyroid disease Father    Hypertension Father    Hypothyroidism Father    Rheum arthritis Father    Depression Brother    Cancer Paternal Grandfather    Bipolar disorder Cousin    Past Surgical History:  Procedure Laterality Date   INTRAUTERINE DEVICE INSERTION  11/05/2009   Social History   Social History Narrative   Married.   2 children.   Teaches English.   Enjoys spending time with family.   Immunization History  Administered Date(s) Administered   Hepatitis A, Adult 04/25/2012, 08/25/2015   Hepatitis B 07/11/1999, 09/30/1999, 01/23/2000   Influenza Whole 11/19/2012   Moderna Sars-Covid-2 Vaccination 02/26/2020, 03/18/2020   Td 07/28/2020   Tdap 04/06/2013     Objective: Vital Signs: There were no vitals taken for this visit.   Physical Exam Vitals and nursing note reviewed.  Constitutional:      Appearance: She is  well-developed.  HENT:     Head: Normocephalic and atraumatic.  Eyes:     Conjunctiva/sclera: Conjunctivae normal.  Cardiovascular:     Rate and Rhythm: Normal rate and regular rhythm.     Heart sounds: Normal heart sounds.  Pulmonary:     Effort: Pulmonary effort is normal.     Breath sounds: Normal breath sounds.  Abdominal:     General: Bowel sounds are normal.     Palpations: Abdomen is soft.  Musculoskeletal:     Cervical back: Normal range of motion.  Lymphadenopathy:     Cervical: No cervical adenopathy.  Skin:    General: Skin is warm and dry.     Capillary Refill: Capillary refill takes less than 2 seconds.  Neurological:     Mental Status: She is alert and oriented to person, place, and time.  Psychiatric:        Behavior: Behavior normal.      Musculoskeletal Exam: ***  CDAI Exam: CDAI Score: -- Patient Global: --; Provider Global: -- Swollen: --; Tender: -- Joint Exam 09/17/2023   No joint exam has been documented for this visit   There is currently no information documented on the homunculus. Go to the Rheumatology activity and complete the homunculus joint exam.  Investigation: No additional findings.  Imaging: No results found.  Recent Labs: Lab Results  Component Value Date   WBC 9.1 02/28/2023   HGB 12.7 02/28/2023   PLT 383 02/28/2023   NA 134 (L) 05/04/2023   K 3.9 05/04/2023   CL 101 05/04/2023   CO2 25 05/04/2023   GLUCOSE 104 (H) 05/04/2023   BUN 7 05/04/2023   CREATININE 0.89 05/04/2023   BILITOT 0.5 02/28/2023   ALKPHOS 53 01/25/2021   AST 20 02/28/2023   ALT 11 02/28/2023   PROT 7.2 02/28/2023   PROT 7.4 02/28/2023   ALBUMIN 4.3 01/25/2021   CALCIUM 9.0 05/04/2023   GFRAA 137 09/20/2011    Speciality Comments: No specialty comments available.  Procedures:  No procedures performed Allergies: Ciprofloxacin hcl, Imitrex [sumatriptan succinate], and Sumatriptan   Assessment / Plan:     Visit Diagnoses: Sjogren's  syndrome with keratoconjunctivitis sicca (HCC)  Raynaud's disease without gangrene  Positive ANA (antinuclear antibody)  Other fatigue  Fibromyalgia  Prediabetes  Weight gain  Dysmenorrhea  Hx of migraines  History  of IBS  Anxiety and depression  Herpes labialis  Orders: No orders of the defined types were placed in this encounter.  No orders of the defined types were placed in this encounter.   Face-to-face time spent with patient was *** minutes. Greater than 50% of time was spent in counseling and coordination of care.  Follow-Up Instructions: No follow-ups on file.   Gearldine Bienenstock, PA-C  Note - This record has been created using Dragon software.  Chart creation errors have been sought, but may not always  have been located. Such creation errors do not reflect on  the standard of medical care.

## 2023-09-13 ENCOUNTER — Encounter: Payer: Self-pay | Admitting: Nurse Practitioner

## 2023-09-13 ENCOUNTER — Ambulatory Visit (INDEPENDENT_AMBULATORY_CARE_PROVIDER_SITE_OTHER): Payer: Medicaid Other | Admitting: Nurse Practitioner

## 2023-09-13 VITALS — BP 112/70 | HR 89 | Ht 65.0 in | Wt 204.0 lb

## 2023-09-13 DIAGNOSIS — N898 Other specified noninflammatory disorders of vagina: Secondary | ICD-10-CM | POA: Diagnosis not present

## 2023-09-13 DIAGNOSIS — Z1331 Encounter for screening for depression: Secondary | ICD-10-CM | POA: Diagnosis not present

## 2023-09-13 DIAGNOSIS — Z3041 Encounter for surveillance of contraceptive pills: Secondary | ICD-10-CM

## 2023-09-13 DIAGNOSIS — N926 Irregular menstruation, unspecified: Secondary | ICD-10-CM | POA: Diagnosis not present

## 2023-09-13 DIAGNOSIS — Z01419 Encounter for gynecological examination (general) (routine) without abnormal findings: Secondary | ICD-10-CM | POA: Diagnosis not present

## 2023-09-13 DIAGNOSIS — Z113 Encounter for screening for infections with a predominantly sexual mode of transmission: Secondary | ICD-10-CM

## 2023-09-13 MED ORDER — NORETHINDRONE 0.35 MG PO TABS: 1.0000 | ORAL_TABLET | Freq: Every day | ORAL | 3 refills | Status: DC

## 2023-09-13 NOTE — Progress Notes (Signed)
[facility] December 06, 1985 [id number]   History:  37 y.o. [id number] presents for annual exam. H/O abnormal bleeding, normal ultrasound in April. Neg PCOS workup in the past. Started on POPs in April 2024 with good management. Had IUD in the past, did not tolerate COCs due to mood changes. Normal pap history. Hashimotos managed by endocrinology. Migraine without aura, HTN, depression, anxiety  managed by PCP. PHQ-8 today. Thinks she may have BV. Having vaginal discharge and mild itching. Found out ex was cheating a couple of months ago, would like STD screening today.   Gynecologic History Patient's last menstrual period was 09/03/2023. Period Duration (Days): 5 Period Pattern: (!) Irregular Menstrual Flow: Moderate Menstrual Control: Other (Comment) (cup) Dysmenorrhea: (!) Mild Dysmenorrhea Symptoms: Cramping, Headache, Diarrhea, Nausea Contraception/Family planning: oral progesterone-only contraceptive Sexually active: No  Health Maintenance Last Pap: 07/28/2020. Results were: Normal neg HPV Last mammogram: Not indicated Last colonoscopy: Not indicated Last Dexa: Not indicated  Past medical history, past surgical history, family history and social history were all reviewed and documented in the EPIC chart. Sons age 61 and 48. 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   ROS:  A ROS was performed and pertinent positives and negatives are included.  Exam:  Vitals:   09/13/23 1125  BP: 112/70  Pulse: 89  SpO2: 100%  Weight: 204 lb (92.5 kg)  Height: 5\' 5"  (1.651 m)   Body mass index is 33.95 kg/m.   General appearance:  Normal Thyroid:  Symmetrical, normal in size, without palpable masses or nodularity. Respiratory  Auscultation:  Clear without wheezing or rhonchi Cardiovascular  Auscultation:  Regular rate, without rubs, murmurs or gallops  Edema/varicosities:  Not grossly evident Abdominal  Soft,nontender, without masses, guarding or rebound.  Liver/spleen:  No  organomegaly noted  Hernia:  None appreciated  Skin  Inspection:  Grossly normal Breasts: Examined lying and sitting.   Right: Without masses, retractions, nipple discharge or axillary adenopathy.   Left: Without masses, retractions, nipple discharge or axillary adenopathy. Pelvic: External genitalia:  no lesions              Urethra:  normal appearing urethra with no masses, tenderness or lesions              Bartholins and Skenes: normal                 Vagina: normal appearing vagina with normal color and discharge, no lesions              Cervix: no lesions Bimanual Exam:  Uterus:  no masses or tenderness              Adnexa: no mass, fullness, tenderness              Rectovaginal: Deferred              Anus:  normal, no lesions  Patient informed chaperone available to be present for breast and pelvic exam. Patient has requested no chaperone to be present. Patient has been advised what will be completed during breast and pelvic exam.   Assessment/Plan:  36 y.o. [id number] for annual exam.   Well female exam with routine gynecological exam - Education provided on SBEs, importance of preventative screenings, current guidelines, high calcium diet, regular exercise, and multivitamin daily. Labs with PCP and endo.   Encounter for surveillance of contraceptive pills - Plan: norethindrone (MICRONOR) 0.35 MG tablet daily. Taking as prescribed.   Irregular periods - Plan: norethindrone (MICRONOR) 0.35 MG  tablet daily. Still irregular but bleeding is much more controlled.   Vaginal discharge - Plan: SureSwab Advanced Vaginitis Plus,TMA  Screening examination for STD (sexually transmitted disease) - Plan: RPR, HIV Antibody (routine testing w rflx), SureSwab Advanced Vaginitis Plus,TMA  Screening for cervical cancer - Normal Pap history.  Will repeat at 5-year interval per guidelines.  Return in about 1 year (around 09/12/2024) for Annual.   Olivia Mackie DNP, 11:42 AM 09/13/2023

## 2023-09-14 LAB — RPR: RPR Ser Ql: NONREACTIVE

## 2023-09-14 LAB — SURESWAB® ADVANCED VAGINITIS PLUS,TMA
C. trachomatis RNA, TMA: DETECTED — AB
CANDIDA SPECIES: NOT DETECTED
Candida glabrata: NOT DETECTED
N. gonorrhoeae RNA, TMA: NOT DETECTED
SURESWAB(R) ADV BACTERIAL VAGINOSIS(BV),TMA: NEGATIVE
TRICHOMONAS VAGINALIS (TV),TMA: NOT DETECTED

## 2023-09-14 LAB — HIV ANTIBODY (ROUTINE TESTING W REFLEX): HIV 1&2 Ab, 4th Generation: NONREACTIVE

## 2023-09-17 ENCOUNTER — Ambulatory Visit: Payer: Medicaid Other | Admitting: Physician Assistant

## 2023-09-17 ENCOUNTER — Other Ambulatory Visit: Payer: Self-pay | Admitting: Nurse Practitioner

## 2023-09-17 DIAGNOSIS — I7300 Raynaud's syndrome without gangrene: Secondary | ICD-10-CM

## 2023-09-17 DIAGNOSIS — R7303 Prediabetes: Secondary | ICD-10-CM

## 2023-09-17 DIAGNOSIS — F32A Depression, unspecified: Secondary | ICD-10-CM

## 2023-09-17 DIAGNOSIS — R635 Abnormal weight gain: Secondary | ICD-10-CM

## 2023-09-17 DIAGNOSIS — Z8719 Personal history of other diseases of the digestive system: Secondary | ICD-10-CM

## 2023-09-17 DIAGNOSIS — R5383 Other fatigue: Secondary | ICD-10-CM

## 2023-09-17 DIAGNOSIS — Z8669 Personal history of other diseases of the nervous system and sense organs: Secondary | ICD-10-CM

## 2023-09-17 DIAGNOSIS — M797 Fibromyalgia: Secondary | ICD-10-CM

## 2023-09-17 DIAGNOSIS — N946 Dysmenorrhea, unspecified: Secondary | ICD-10-CM

## 2023-09-17 DIAGNOSIS — M3501 Sicca syndrome with keratoconjunctivitis: Secondary | ICD-10-CM

## 2023-09-17 DIAGNOSIS — R768 Other specified abnormal immunological findings in serum: Secondary | ICD-10-CM

## 2023-09-17 DIAGNOSIS — B001 Herpesviral vesicular dermatitis: Secondary | ICD-10-CM

## 2023-09-17 DIAGNOSIS — A749 Chlamydial infection, unspecified: Secondary | ICD-10-CM

## 2023-09-17 MED ORDER — DOXYCYCLINE MONOHYDRATE 100 MG PO CAPS: 100.0000 mg | ORAL_CAPSULE | Freq: Two times a day (BID) | ORAL | 0 refills | Status: AC

## 2023-09-26 DIAGNOSIS — Z419 Encounter for procedure for purposes other than remedying health state, unspecified: Secondary | ICD-10-CM | POA: Diagnosis not present

## 2023-10-02 ENCOUNTER — Encounter: Payer: Self-pay | Admitting: Nurse Practitioner

## 2023-10-05 ENCOUNTER — Encounter: Payer: Self-pay | Admitting: Primary Care

## 2023-10-05 ENCOUNTER — Ambulatory Visit (INDEPENDENT_AMBULATORY_CARE_PROVIDER_SITE_OTHER): Payer: Medicaid Other | Admitting: Primary Care

## 2023-10-05 VITALS — BP 118/62 | HR 88 | Temp 97.3°F | Ht 65.0 in | Wt 200.0 lb

## 2023-10-05 DIAGNOSIS — R21 Rash and other nonspecific skin eruption: Secondary | ICD-10-CM | POA: Diagnosis not present

## 2023-10-05 MED ORDER — PREDNISONE 20 MG PO TABS: ORAL_TABLET | ORAL | 0 refills | Status: DC

## 2023-10-05 NOTE — Patient Instructions (Signed)
 Start prednisone tablets. Take two tablets my mouth once daily in the morning for four days, then one tablet once daily in the morning for four days.   Please update me if no improvement.  It was a pleasure to see you today!

## 2023-10-05 NOTE — Progress Notes (Signed)
 Subjective:    Patient ID: Nichole Mcbride, female    DOB: April 07, 1986, 38 y.o.   MRN: [id number]  Rash Pertinent negatives include no fever.    Nichole Mcbride is a very pleasant 38 y.o. female with a history of Raynaud's disease, hypertension, herpes labialis, fibromyalgia, Sjogren's disease, prediabetes, lymphedema who presents today to discuss rash.  Symptom onset 4 days ago with a rash to the left lateral breast. Since then she's noticed the rash to the right breast and under both breasts.   She denies itching, pain, drainage. She's applied Aquafor without improvement.   No new lotions, detergents, soaps or shampoos. No new medicines, vitamins, supplements. No new pets. No recent outdoor exposure or poison ivy exposure. No bonfire or smoke exposure.  No recent motel or hotel stay or new beds.   No fevers/chills, oral lesions, new joint pains, tick bites, abdominal pain, nausea.     Review of Systems  Constitutional:  Negative for fever.  Skin:  Positive for rash.  Neurological:  Negative for numbness.         Past Medical History:  Diagnosis Date   Anxiety    Blunt trauma of face 01/25/2021   Carpal tunnel syndrome    bilaterally   DEPRESSION 06/10/2008   Hashimoto's thyroiditis    INSOMNIA 06/10/2008   Lymphedema    dx by vascular, per patient   Migraine headache    Raynaud phenomenon    Rib pain on right side 04/29/2020   SCOLIOSIS 06/10/2008   Sjogren's disease (HCC)    STD (sexually transmitted disease)    hsv oral   Supervision of other normal pregnancy, antepartum 07/09/2017   Clinic  [facility]  Prenatal Labs  Dating     Blood type:     Genetic Screen  1 Screen:    AFP:     Quad:     NIPS:  Antibody:   Anatomic US      Rubella:   Varicella:    GTT  Early:               Third trimester:   RPR:     Rhogam     HBsAg:     TDaP vaccine                        Flu Shot:  HIV:     Baby Food                                  GBS:   Contraception      Pap:  CBB         CS/VBAC          Social History   Socioeconomic History   Marital status: Divorced    Spouse name: Not on file   Number of children: Not on file   Years of education: Not on file   Highest education level: Some college, no degree  Occupational History   Not on file  Tobacco Use   Smoking status: Former    Types: Cigarettes    Passive exposure: Past   Smokeless tobacco: Never  Vaping Use   Vaping status: Former  Substance and Sexual Activity   Alcohol use: Not Currently   Drug use: Never   Sexual activity: Yes    Partners: Male    Birth control/protection: Condom    Comment: HSV 1+  Other Topics Concern   Not on file  Social History Narrative   Married.   2 children.   Teaches English.   Enjoys spending time with family.   Social Drivers of Corporate Investment Banker Strain: Low Risk  (01/08/2023)   Overall Financial Resource Strain (CARDIA)    Difficulty of Paying Living Expenses: Not very hard  Food Insecurity: No Food Insecurity (01/08/2023)   Hunger Vital Sign    Worried About Running Out of Food in the Last Year: Never true    Ran Out of Food in the Last Year: Never true  Transportation Needs: No Transportation Needs (01/08/2023)   PRAPARE - Administrator, Civil Service (Medical): No    Lack of Transportation (Non-Medical): No  Physical Activity: Insufficiently Active (01/08/2023)   Exercise Vital Sign    Days of Exercise per Week: 3 days    Minutes of Exercise per Session: 40 min  Stress: Stress Concern Present (01/08/2023)   [employer] of Occupational Health - Occupational Stress Questionnaire    Feeling of Stress : To some extent  Social Connections: Moderately Isolated (01/08/2023)   Social Connection and Isolation Panel [NHANES]    Frequency of Communication with Friends and Family: More than three times a week    Frequency of Social Gatherings with Friends and Family: More than three times a week    Attends Religious  Services: 1 to 4 times per year    Active Member of [employer] or Organizations: No    Attends Engineer, Structural: Not on file    Marital Status: Divorced  Intimate Partner Violence: Not on file    Past Surgical History:  Procedure Laterality Date   INTRAUTERINE DEVICE INSERTION  11/05/2009    Family History  Problem Relation Age of Onset   Thyroid  disease Mother    Transient ischemic attack Mother        x3   Diabetes Mother    Hypertension Mother    Hypothyroidism Mother    Depression Mother    Thyroid  disease Father    Hypertension Father    Hypothyroidism Father    Rheum arthritis Father    Depression Brother    Cancer Paternal Grandfather    Bipolar disorder Cousin     Allergies  Allergen Reactions   Ciprofloxacin Hcl Other (See Comments)    unknown   Imitrex  [Sumatriptan  Succinate] Diarrhea and Nausea And Vomiting    severe   Sumatriptan  Other (See Comments)    tightness in my chest and dizziness    Current Outpatient Medications on File Prior to Visit  Medication Sig Dispense Refill   DULoxetine  (CYMBALTA ) 30 MG capsule TAKE 1 CAPSULE(30 MG) BY MOUTH DAILY FOR ANXIETY OR DEPRESSION OR PAIN 90 capsule 0   Erenumab-aooe (AIMOVIG) 140 MG/ML SOAJ Inject 140 mg into the skin every 30 (thirty) days.     ibuprofen (ADVIL) 400 MG tablet Take 400 mg by mouth every 6 (six) hours as needed.     norethindrone  (MICRONOR ) 0.35 MG tablet Take 1 tablet (0.35 mg total) by mouth daily. 84 tablet 3   omeprazole  (PRILOSEC) 20 MG capsule Take 1 capsule (20 mg total) by mouth daily. 30 capsule 2   ondansetron  (ZOFRAN -ODT) 4 MG disintegrating tablet Take 1 tablet (4 mg total) by mouth every 8 (eight) hours as needed for nausea or vomiting. 20 tablet 0   potassium chloride  SA (KLOR-CON  M) 20 MEQ tablet Take 1 tablet (20 mEq total) by mouth  daily. For low potassium. 5 tablet 0   rizatriptan  (MAXALT ) 10 MG tablet Take 1 tablet by mouth at migraine onset, May repeat in 2 hours if  needed 10 tablet 0   SELENIUM PO Take by mouth daily.     tiZANidine  (ZANAFLEX ) 4 MG tablet Take 1 tablet (4 mg total) by mouth daily as needed for muscle spasms. And migraines 30 tablet 0   valACYclovir  (VALTREX ) 500 MG tablet Take 4 tablets twice daily for one day, then take 1 tablet by mouth once daily for outbreak prevention. 98 tablet 0   valsartan  (DIOVAN ) 80 MG tablet Take 1 tablet (80 mg total) by mouth daily. for blood pressure. 90 tablet 2   No current facility-administered medications on file prior to visit.    BP 118/62   Pulse 88   Temp (!) 97.3 F (36.3 C) (Temporal)   Ht 5' 5 (1.651 m)   Wt 200 lb (90.7 kg)   LMP 09/03/2023   SpO2 95%   BMI 33.28 kg/m  Objective:   Physical Exam Constitutional:      General: She is not in acute distress. Pulmonary:     Effort: Pulmonary effort is normal.  Skin:    General: Skin is warm and dry.     Findings: Rash present.     Comments: Several small rounded, light pink, slightly raised rash to bilateral right and left breast both anterior and lateral. Several spots to the epigastric region. No scaling.           Assessment & Plan:  Rash and nonspecific skin eruption Assessment & Plan: At initial glance, rash appears fungal.  Difficult to discern. Not shingles, bedbugs, psoriasis, eczema.  Start prednisone  tablets. Take two tablets my mouth once daily in the morning for four days, then one tablet once daily in the morning for four days.   Follow-up as needed.  Consider antifungal treatment if no improvement.  Orders: -     predniSONE ; Take two tablets my mouth once daily in the morning for four days, then one tablet once daily in the morning for four days.  Dispense: 12 tablet; Refill: 0        Comer MARLA Gaskins, NP

## 2023-10-05 NOTE — Assessment & Plan Note (Signed)
 At initial glance, rash appears fungal.  Difficult to discern. Not shingles, bedbugs, psoriasis, eczema.  Start prednisone  tablets. Take two tablets my mouth once daily in the morning for four days, then one tablet once daily in the morning for four days.   Follow-up as needed.  Consider antifungal treatment if no improvement.

## 2023-10-07 DIAGNOSIS — Z419 Encounter for procedure for purposes other than remedying health state, unspecified: Secondary | ICD-10-CM | POA: Diagnosis not present

## 2023-10-15 NOTE — Progress Notes (Unsigned)
Office Visit Note  Patient: Nichole Mcbride             Date of Birth: Jun 27, 1986           MRN: [id number]             PCP: Doreene Nest, NP Referring: Doreene Nest, NP Visit Date: 10/29/2023 Occupation: @GUAROCC @  Subjective:  Intermittent rashes   History of Present Illness: Nichole Mcbride is a 38 y.o. female with history of sjogren's syndrome and osteoarthritis.   She is not currently taking any immunosuppressive agents.  Patient was last seen in the office on 02/28/2023.  Patient's been experiencing intermittent symptoms of Raynaud's phenomenon especially with the cooler weather temperatures.  She continues to have chronic sicca symptoms which have been stable overall.  She has been using Biotene products as well as over-the-counter eyedrops for symptomatic relief.  She denies any shortness of breath or new pulmonary symptoms.  She denies any swollen lymph nodes.  Patient has noticed intermittent facial flushing as well as a recent rash on her chest.  She was prescribed a prednisone taper by her PCP and states that the rash started to improve 2 days prior to completing the prednisone taper.  She has not had a recurrence.  Patient denies any identifiable trigger for the rash.  She has noticed increased pain in both hands but denies any joint swelling.  Overall her symptoms have been manageable. She denies any recurrent infections or new medical conditions.   Activities of Daily Living:  Patient reports morning stiffness for 60 minutes.   Patient Denies nocturnal pain.  Difficulty dressing/grooming: Denies Difficulty climbing stairs: Denies Difficulty getting out of chair: Denies Difficulty using hands for taps, buttons, cutlery, and/or writing: Denies  Review of Systems  Constitutional:  Positive for fatigue.  HENT:  Positive for mouth dryness. Negative for mouth sores.   Eyes:  Positive for dryness.  Respiratory:  Negative for shortness of breath.    Cardiovascular:  Negative for chest pain and palpitations.  Gastrointestinal:  Positive for diarrhea. Negative for blood in stool and constipation.  Endocrine: Negative for increased urination.  Genitourinary:  Negative for involuntary urination.  Musculoskeletal:  Positive for joint pain, joint pain, joint swelling, myalgias, muscle weakness, morning stiffness, muscle tenderness and myalgias. Negative for gait problem.  Skin:  Positive for color change, rash and sensitivity to sunlight. Negative for hair loss.  Allergic/Immunologic: Positive for susceptible to infections.  Neurological:  Positive for headaches. Negative for dizziness.  Hematological:  Negative for swollen glands.  Psychiatric/Behavioral:  Positive for depressed mood and sleep disturbance. The patient is nervous/anxious.     PMFS History:  Patient Active Problem List   Diagnosis Date Noted   Primary hypertension 03/06/2023   Sore throat 01/17/2023   Chronic diarrhea 01/09/2023   Lymphedema 01/07/2023   Swelling of lower extremity 11/02/2022   Abnormal uterine bleeding 09/14/2022   Class 2 obesity due to excess calories with body mass index (BMI) of 37.0 to 37.9 in adult 09/14/2022   Family history of thyroid disease 07/05/2021   Joint pain in both hands 02/15/2021   Generalized body aches 01/25/2021   MDD (major depressive disorder), recurrent episode, moderate (HCC) 08/08/2018   Prediabetes 01/25/2018   BMI 31.0-31.9,adult 07/09/2017   Rash and nonspecific skin eruption 06/15/2016   Herpes labialis 06/15/2016   Preventative health care 05/04/2016   Fibromyalgia 03/23/2016   Anxiety and depression 03/23/2016   Sjogren's disease (HCC) 03/23/2016  Migraines 10/14/2011   Raynaud's disease 09/20/2011   Acute cystitis with hematuria 06/10/2008   SCOLIOSIS 06/10/2008    Past Medical History:  Diagnosis Date   Anxiety    Blunt trauma of face 01/25/2021   Carpal tunnel syndrome    bilaterally   DEPRESSION  06/10/2008   Hashimoto's thyroiditis    INSOMNIA 06/10/2008   Lymphedema    dx by vascular, per patient   Migraine headache    Raynaud phenomenon    Rib pain on right side 04/29/2020   SCOLIOSIS 06/10/2008   Sjogren's disease (HCC)    STD (sexually transmitted disease)    hsv oral   Supervision of other normal pregnancy, antepartum 07/09/2017   Clinic  [facility]  Prenatal Labs  Dating     Blood type:     Genetic Screen  1 Screen:    AFP:     Quad:     NIPS:  Antibody:   Anatomic [place]     Rubella:   Varicella:    GTT  Early:               Third trimester:   RPR:     Rhogam     HBsAg:     TDaP vaccine                        Flu Shot:  HIV:     Baby Food                                  GBS:   Contraception     Pap:  CBB         CS/VBAC          Family History  Problem Relation Age of Onset   Thyroid disease Mother    Transient ischemic attack Mother        x3   Diabetes Mother    Hypertension Mother    Hypothyroidism Mother    Depression Mother    Thyroid disease Father    Hypertension Father    Hypothyroidism Father    Rheum arthritis Father    Depression Brother    Cancer Paternal Grandfather    Bipolar disorder Cousin    Past Surgical History:  Procedure Laterality Date   INTRAUTERINE DEVICE INSERTION  11/05/2009   Social History   Social History Narrative   Married.   2 children.   Teaches English.   Enjoys spending time with family.   Immunization History  Administered Date(s) Administered   Hepatitis A, Adult 04/25/2012, 08/25/2015   Hepatitis B 07/11/1999, 09/30/1999, 01/23/2000   Influenza Whole 11/19/2012   Moderna Sars-Covid-2 Vaccination 02/26/2020, 03/18/2020   Td 07/28/2020   Tdap 04/06/2013     Objective: Vital Signs: BP 120/85 (BP Location: Left Arm, Patient Position: Sitting, Cuff Size: Normal)   Pulse 86   Resp 14   Ht 5\' 5"  (1.651 m)   Wt 199 lb (90.3 kg)   LMP 10/28/2023   BMI 33.12 kg/m    Physical Exam Vitals and nursing note reviewed.   Constitutional:      Appearance: She is well-developed.  HENT:     Head: Normocephalic and atraumatic.  Eyes:     Conjunctiva/sclera: Conjunctivae normal.  Cardiovascular:     Rate and Rhythm: Normal rate and regular rhythm.     Heart sounds: Normal heart sounds.  Pulmonary:  Effort: Pulmonary effort is normal.     Breath sounds: Normal breath sounds.  Abdominal:     General: Bowel sounds are normal.     Palpations: Abdomen is soft.  Musculoskeletal:     Cervical back: Normal range of motion.  Lymphadenopathy:     Cervical: No cervical adenopathy.  Skin:    General: Skin is warm and dry.     Capillary Refill: Capillary refill takes less than 2 seconds.  Neurological:     Mental Status: She is alert and oriented to person, place, and time.  Psychiatric:        Behavior: Behavior normal.      Musculoskeletal Exam: C-spine, thoracic spine, lumbar spine have good range of motion.  Shoulder joints, elbow joints, wrist joints, CMC joints, PIPs, DIPs have good range of motion with no synovitis.  PIP and DIP prominence noted.  Complete fist formation bilaterally.  Hip joints have good range of motion with no groin pain.  Knee joints have good range of motion no warmth or effusion.  Ankle joints have good range of motion with no tenderness or joint swelling.  CDAI Exam: CDAI Score: -- Patient Global: --; Provider Global: -- Swollen: --; Tender: -- Joint Exam 10/29/2023   No joint exam has been documented for this visit   There is currently no information documented on the homunculus. Go to the Rheumatology activity and complete the homunculus joint exam.  Investigation: No additional findings.  Imaging: No results found.  Recent Labs: Lab Results  Component Value Date   WBC 9.1 02/28/2023   HGB 12.7 02/28/2023   PLT 383 02/28/2023   NA 134 (L) 05/04/2023   K 3.9 05/04/2023   CL 101 05/04/2023   CO2 25 05/04/2023   GLUCOSE 104 (H) 05/04/2023   BUN 7 05/04/2023    CREATININE 0.89 05/04/2023   BILITOT 0.5 02/28/2023   ALKPHOS 53 01/25/2021   AST 20 02/28/2023   ALT 11 02/28/2023   PROT 7.2 02/28/2023   PROT 7.4 02/28/2023   ALBUMIN 4.3 01/25/2021   CALCIUM 9.0 05/04/2023   GFRAA 137 09/20/2011    Speciality Comments: No specialty comments available.  Procedures:  No procedures performed Allergies: Ciprofloxacin hcl, Imitrex [sumatriptan succinate], and Sumatriptan   Assessment / Plan:     Visit Diagnoses: Sjogren's syndrome with keratoconjunctivitis sicca (HCC) - History of sicca, fatigue, Raynauds, arthralgias, photosensitivity, miscarriage x1.  Positive ANA, positive Ro, positive anti-centromere, RF-:  Patient continues to have chronic sicca symptoms which have been stable overall.  She has not had any oral or nasal ulcerations.  No cervical lymphadenopathy.  Discussed the importance of continuing to see the dentist every 6 months and ophthalmology at least on a yearly basis.  She has been using over-the-counter products for symptomatic relief.  She declined the use of pilocarpine at her last office visit.  She has noticed some increased pain and stiffness involving both hands but has no synovitis on examination today.  She has been experiencing intermittent symptoms of Raynaud's phenomenon but had no signs of sclerodactyly, digital ulcerations, or delayed capillary refill on examination.  She recently had a rash primarily involving her chest and has had intermittent facial flushing.  She has not been able to identify a trigger for the rash but has not had any recurrence.  She has not been using any new products.  Discussed the possibility of initiating a trial of Plaquenil but she has declined at this time.  Lab work from 02/28/2023  was reviewed today in the office: ANA and Ro antibody remain positive, rest of workup was negative.  The following lab work will be obtained today for further evaluation.  She will notify us if she develops any new or worsening  symptoms.  She will follow-up in the office in 5 months or sooner if needed. - Plan: C3 and C4, Sedimentation rate, Protein / creatinine ratio, urine, CBC with Differential/Platelet, COMPLETE METABOLIC PANEL WITH GFR, Sjogrens syndrome-A extractable nuclear antibody, Serum protein electrophoresis with reflex, ANA  Raynaud's disease without gangrene: She has been experiencing intermittent symptoms of Raynaud's phenomenon in her fingertips especially with cooler weather temperatures.  Discussed the importance of keeping her core body temperature warm, using hand warmers, gloves, socks, and drinking warm fluids.  No signs of sclerodactyly noted on examination today.  No digital ulcerations or delayed capillary refill noted.  Patient was advised to notify us if she develops any new or worsening symptoms.  Positive ANA (antinuclear antibody) -Lab work from 02/28/2023 was reviewed today in the office: ANA 1: 1280, lupus anticoagulant not detected, anticardiolipin antibodies negative, beta-2 glycoprotein antibodies negative, SPEP normal, Ro antibody positive, sed rate within normal limits, complements within normal limits, double-stranded DNA negative.  Labs were not consistent with active disease.  She is not currently taking immunosuppressive agents and does not require immunosuppression at this time.  Discussed the possibility of a trial of Plaquenil but she has declined at this time. Plan: C3 and C4, Sedimentation rate, Protein / creatinine ratio, urine, CBC with Differential/Platelet, COMPLETE METABOLIC PANEL WITH GFR, Sjogrens syndrome-A extractable nuclear antibody, Serum protein electrophoresis with reflex, ANA  Other fatigue: Chronic, stable.  Fibromyalgia - She was diagnosed with fibromyalgia in 2015 while she was in [place].  Other medical conditions are listed as follows:  Prediabetes  Dysmenorrhea  Hx of migraines  History of IBS  Anxiety and depression  Herpes labialis  Orders: Orders  Placed This Encounter  Procedures   C3 and C4   Sedimentation rate   Protein / creatinine ratio, urine   CBC with Differential/Platelet   COMPLETE METABOLIC PANEL WITH GFR   Sjogrens syndrome-A extractable nuclear antibody   Serum protein electrophoresis with reflex   ANA   No orders of the defined types were placed in this encounter.   Follow-Up Instructions: Return in about 5 months (around 03/27/2024) for Sjogren's syndrome, Osteoarthritis.   Gearldine Bienenstock, PA-C  Note - This record has been created using Dragon software.  Chart creation errors have been sought, but may not always  have been located. Such creation errors do not reflect on  the standard of medical care.

## 2023-10-17 ENCOUNTER — Ambulatory Visit: Payer: Medicaid Other | Admitting: Nurse Practitioner

## 2023-10-22 ENCOUNTER — Ambulatory Visit: Payer: Medicaid Other | Admitting: Nurse Practitioner

## 2023-10-27 DIAGNOSIS — Z419 Encounter for procedure for purposes other than remedying health state, unspecified: Secondary | ICD-10-CM | POA: Diagnosis not present

## 2023-10-29 ENCOUNTER — Encounter: Payer: Self-pay | Admitting: Physician Assistant

## 2023-10-29 ENCOUNTER — Ambulatory Visit: Payer: Medicaid Other | Attending: Physician Assistant | Admitting: Physician Assistant

## 2023-10-29 VITALS — BP 120/85 | HR 86 | Resp 14 | Ht 65.0 in | Wt 199.0 lb

## 2023-10-29 DIAGNOSIS — F32A Depression, unspecified: Secondary | ICD-10-CM

## 2023-10-29 DIAGNOSIS — R768 Other specified abnormal immunological findings in serum: Secondary | ICD-10-CM

## 2023-10-29 DIAGNOSIS — N946 Dysmenorrhea, unspecified: Secondary | ICD-10-CM

## 2023-10-29 DIAGNOSIS — M797 Fibromyalgia: Secondary | ICD-10-CM | POA: Diagnosis not present

## 2023-10-29 DIAGNOSIS — B001 Herpesviral vesicular dermatitis: Secondary | ICD-10-CM | POA: Diagnosis not present

## 2023-10-29 DIAGNOSIS — Z8719 Personal history of other diseases of the digestive system: Secondary | ICD-10-CM | POA: Diagnosis not present

## 2023-10-29 DIAGNOSIS — Z8669 Personal history of other diseases of the nervous system and sense organs: Secondary | ICD-10-CM

## 2023-10-29 DIAGNOSIS — M3501 Sicca syndrome with keratoconjunctivitis: Secondary | ICD-10-CM | POA: Diagnosis not present

## 2023-10-29 DIAGNOSIS — R5383 Other fatigue: Secondary | ICD-10-CM | POA: Diagnosis not present

## 2023-10-29 DIAGNOSIS — R7303 Prediabetes: Secondary | ICD-10-CM | POA: Diagnosis not present

## 2023-10-29 DIAGNOSIS — I7300 Raynaud's syndrome without gangrene: Secondary | ICD-10-CM

## 2023-10-29 DIAGNOSIS — F419 Anxiety disorder, unspecified: Secondary | ICD-10-CM | POA: Diagnosis not present

## 2023-10-30 ENCOUNTER — Ambulatory Visit: Payer: Medicaid Other | Admitting: Nurse Practitioner

## 2023-10-30 NOTE — Progress Notes (Signed)
Hemoglobin and hematocrit low-please clarify if she was on her menstrual cycle? Calcium is borderline low-8.5. increase dietary calcium intake.  Rest of CMP WNL ESR WNL Complements WNL  Urine protein creatinine ratio WNL

## 2023-10-31 LAB — COMPLETE METABOLIC PANEL WITH GFR
AG Ratio: 1.7 (calc) (ref 1.0–2.5)
ALT: 7 U/L (ref 6–29)
AST: 12 U/L (ref 10–30)
Albumin: 3.8 g/dL (ref 3.6–5.1)
Alkaline phosphatase (APISO): 55 U/L (ref 31–125)
BUN/Creatinine Ratio: 8 (calc) (ref 6–22)
BUN: 6 mg/dL — ABNORMAL LOW (ref 7–25)
CO2: 22 mmol/L (ref 20–32)
Calcium: 8.5 mg/dL — ABNORMAL LOW (ref 8.6–10.2)
Chloride: 107 mmol/L (ref 98–110)
Creat: 0.71 mg/dL (ref 0.50–0.97)
Globulin: 2.3 g/dL (ref 1.9–3.7)
Glucose, Bld: 106 mg/dL — ABNORMAL HIGH (ref 65–99)
Potassium: 3.9 mmol/L (ref 3.5–5.3)
Sodium: 139 mmol/L (ref 135–146)
Total Bilirubin: 0.3 mg/dL (ref 0.2–1.2)
Total Protein: 6.1 g/dL (ref 6.1–8.1)
eGFR: 114 mL/min/{1.73_m2} (ref >=60)

## 2023-10-31 LAB — PROTEIN ELECTROPHORESIS, SERUM, WITH REFLEX
Albumin ELP: 3.6 g/dL — ABNORMAL LOW (ref 3.8–4.8)
Alpha 1: 0.3 g/dL (ref 0.2–0.3)
Alpha 2: 0.6 g/dL (ref 0.5–0.9)
Beta 2: 0.3 g/dL (ref 0.2–0.5)
Beta Globulin: 0.4 g/dL (ref 0.4–0.6)
Gamma Globulin: 0.8 g/dL (ref 0.8–1.7)
Total Protein: 6 g/dL — ABNORMAL LOW (ref 6.1–8.1)

## 2023-10-31 LAB — CBC WITH DIFFERENTIAL/PLATELET
Absolute Lymphocytes: 2894 {cells}/uL (ref 850–3900)
Absolute Monocytes: 484 {cells}/uL (ref 200–950)
Basophils Absolute: 23 {cells}/uL (ref 0–200)
Basophils Relative: 0.3 %
Eosinophils Absolute: 47 {cells}/uL (ref 15–500)
Eosinophils Relative: 0.6 %
HCT: 34.3 % — ABNORMAL LOW (ref 35.0–45.0)
Hemoglobin: 11.5 g/dL — ABNORMAL LOW (ref 11.7–15.5)
MCH: 26.9 pg — ABNORMAL LOW (ref 27.0–33.0)
MCHC: 33.5 g/dL (ref 32.0–36.0)
MCV: 80.1 fL (ref 80.0–100.0)
MPV: 10 fL (ref 7.5–12.5)
Monocytes Relative: 6.2 %
Neutro Abs: 4352 {cells}/uL (ref 1500–7800)
Neutrophils Relative %: 55.8 %
Platelets: 335 10*3/uL (ref 140–400)
RBC: 4.28 10*6/uL (ref 3.80–5.10)
RDW: 13.7 % (ref 11.0–15.0)
Total Lymphocyte: 37.1 %
WBC: 7.8 10*3/uL (ref 3.8–10.8)

## 2023-10-31 LAB — C3 AND C4
C3 Complement: 134 mg/dL (ref 83–193)
C4 Complement: 23 mg/dL (ref 15–57)

## 2023-10-31 LAB — ANTI-NUCLEAR AB-TITER (ANA TITER)
ANA TITER: 1:40 {titer} — ABNORMAL HIGH
ANA Titer 1: 1:640 {titer} — ABNORMAL HIGH

## 2023-10-31 LAB — PROTEIN / CREATININE RATIO, URINE
Creatinine, Urine: 221 mg/dL (ref 20–275)
Protein/Creat Ratio: 68 mg/g{creat} (ref 24–184)
Protein/Creatinine Ratio: 0.068 mg/mg{creat} (ref 0.024–0.184)
Total Protein, Urine: 15 mg/dL (ref 5–24)

## 2023-10-31 LAB — SJOGRENS SYNDROME-A EXTRACTABLE NUCLEAR ANTIBODY: SSA (Ro) (ENA) Antibody, IgG: 4.7 AI — AB

## 2023-10-31 LAB — ANA: Anti Nuclear Antibody (ANA): POSITIVE — AB

## 2023-10-31 LAB — SEDIMENTATION RATE: Sed Rate: 11 mm/h (ref 0–20)

## 2023-10-31 NOTE — Progress Notes (Signed)
Ro antibody remains positive.

## 2023-11-06 ENCOUNTER — Ambulatory Visit: Payer: Medicaid Other | Admitting: Nurse Practitioner

## 2023-11-13 ENCOUNTER — Other Ambulatory Visit: Payer: Self-pay | Admitting: Primary Care

## 2023-11-13 ENCOUNTER — Encounter: Payer: Self-pay | Admitting: Internal Medicine

## 2023-11-13 ENCOUNTER — Ambulatory Visit (INDEPENDENT_AMBULATORY_CARE_PROVIDER_SITE_OTHER): Payer: Medicaid Other | Admitting: Internal Medicine

## 2023-11-13 VITALS — BP 112/80 | HR 100 | Temp 98.5°F | Ht 65.0 in | Wt 200.0 lb

## 2023-11-13 DIAGNOSIS — G43009 Migraine without aura, not intractable, without status migrainosus: Secondary | ICD-10-CM

## 2023-11-13 DIAGNOSIS — J019 Acute sinusitis, unspecified: Secondary | ICD-10-CM | POA: Diagnosis not present

## 2023-11-13 DIAGNOSIS — M797 Fibromyalgia: Secondary | ICD-10-CM

## 2023-11-13 DIAGNOSIS — F32A Depression, unspecified: Secondary | ICD-10-CM

## 2023-11-13 DIAGNOSIS — G43909 Migraine, unspecified, not intractable, without status migrainosus: Secondary | ICD-10-CM

## 2023-11-13 MED ORDER — DULOXETINE HCL 30 MG PO CPEP: 30.0000 mg | ORAL_CAPSULE | Freq: Every day | ORAL | 0 refills | Status: DC

## 2023-11-13 MED ORDER — ONDANSETRON 4 MG PO TBDP: 4.0000 mg | ORAL_TABLET | Freq: Three times a day (TID) | ORAL | 0 refills | Status: DC | PRN

## 2023-11-13 MED ORDER — AMOXICILLIN 500 MG PO TABS: 1000.0000 mg | ORAL_TABLET | Freq: Two times a day (BID) | ORAL | 0 refills | Status: AC

## 2023-11-13 MED ORDER — TIZANIDINE HCL 4 MG PO TABS: 4.0000 mg | ORAL_TABLET | Freq: Every day | ORAL | 0 refills | Status: DC | PRN
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
## 2023-11-13 NOTE — Assessment & Plan Note (Signed)
 Has had sinus symptoms since flu 2 weeks ago Now with ear pain that is probably from Eustachian tube dysfunction Analgesics Amoxil 1000 bid x 7 days---if doesn't resolve, would change to augmentin

## 2023-11-14 ENCOUNTER — Ambulatory Visit: Payer: Medicaid Other | Admitting: Nurse Practitioner

## 2023-11-24 DIAGNOSIS — Z419 Encounter for procedure for purposes other than remedying health state, unspecified: Secondary | ICD-10-CM | POA: Diagnosis not present

## 2023-11-26 ENCOUNTER — Other Ambulatory Visit: Payer: Self-pay | Admitting: Gastroenterology

## 2023-11-29 ENCOUNTER — Encounter: Payer: Self-pay | Admitting: Nurse Practitioner

## 2023-11-29 ENCOUNTER — Ambulatory Visit (INDEPENDENT_AMBULATORY_CARE_PROVIDER_SITE_OTHER): Payer: Medicaid Other | Admitting: Nurse Practitioner

## 2023-11-29 VITALS — BP 112/80 | HR 86

## 2023-11-29 DIAGNOSIS — A749 Chlamydial infection, unspecified: Secondary | ICD-10-CM | POA: Diagnosis not present

## 2023-11-29 NOTE — Progress Notes (Signed)
   Acute Office Visit  Subjective:    Patient ID: Nichole Mcbride, female    DOB: 1986/04/16, 38 y.o.   MRN: [id number]   HPI 38 y.o. presents today for TOC. + chlamydia 09/13/2023 after finding out ex cheated. Has missed a couple of appointments for retest. No symptoms today. Has not been sexually active.   Patient's last menstrual period was 11/21/2023 (approximate).    Review of Systems  Constitutional: Negative.   Genitourinary: Negative.        Objective:    Physical Exam Constitutional:      Appearance: Normal appearance.  Genitourinary:    General: Normal vulva.     Vagina: Normal.     Cervix: Normal.     BP 112/80   Pulse 86   LMP 11/21/2023 (Approximate)   SpO2 95%  Wt Readings from Last 3 Encounters:  11/13/23 200 lb (90.7 kg)  10/29/23 199 lb (90.3 kg)  10/05/23 200 lb (90.7 kg)        Patient informed chaperone available to be present for breast and/or pelvic exam. Patient has requested no chaperone to be present. Patient has been advised what will be completed during breast and pelvic exam.   Assessment & Plan:   Problem List Items Addressed This Visit   None Visit Diagnoses       Chlamydia infection    -  Primary   Relevant Orders   C. trachomatis/N. gonorrhoeae RNA      Plan: GC/CT pending.   Return if symptoms worsen or fail to improve.    Olivia Mackie DNP, 9:55 AM 11/29/2023

## 2023-11-30 LAB — C. TRACHOMATIS/N. GONORRHOEAE RNA
C. trachomatis RNA, TMA: NOT DETECTED
N. gonorrhoeae RNA, TMA: NOT DETECTED

## 2023-12-03 ENCOUNTER — Encounter: Payer: Self-pay | Admitting: Nurse Practitioner

## 2024-01-05 DIAGNOSIS — Z419 Encounter for procedure for purposes other than remedying health state, unspecified: Secondary | ICD-10-CM | POA: Diagnosis not present

## 2024-02-04 DIAGNOSIS — Z419 Encounter for procedure for purposes other than remedying health state, unspecified: Secondary | ICD-10-CM | POA: Diagnosis not present

## 2024-02-11 ENCOUNTER — Other Ambulatory Visit: Payer: Self-pay

## 2024-02-11 DIAGNOSIS — M797 Fibromyalgia: Secondary | ICD-10-CM

## 2024-02-11 DIAGNOSIS — F32A Depression, unspecified: Secondary | ICD-10-CM

## 2024-02-11 MED ORDER — DULOXETINE HCL 30 MG PO CPEP: 30.0000 mg | ORAL_CAPSULE | Freq: Every day | ORAL | 0 refills | Status: DC

## 2024-02-11 NOTE — Telephone Encounter (Signed)
 Patient is due for CPE/follow up, this will be required prior to any further refills.  Please schedule, thank you!

## 2024-02-12 ENCOUNTER — Encounter (INDEPENDENT_AMBULATORY_CARE_PROVIDER_SITE_OTHER): Payer: Self-pay

## 2024-02-12 NOTE — Telephone Encounter (Signed)
 Lvmtcb-pt scheduled ov for tomorrow, 02/13/24

## 2024-02-13 ENCOUNTER — Encounter: Payer: Self-pay | Admitting: Primary Care

## 2024-02-13 ENCOUNTER — Ambulatory Visit: Payer: Self-pay | Admitting: Primary Care

## 2024-02-13 ENCOUNTER — Ambulatory Visit (INDEPENDENT_AMBULATORY_CARE_PROVIDER_SITE_OTHER): Admitting: Primary Care

## 2024-02-13 VITALS — BP 98/62 | HR 90 | Temp 97.4°F | Ht 65.0 in | Wt 185.0 lb

## 2024-02-13 DIAGNOSIS — I10 Essential (primary) hypertension: Secondary | ICD-10-CM

## 2024-02-13 DIAGNOSIS — K529 Noninfective gastroenteritis and colitis, unspecified: Secondary | ICD-10-CM | POA: Diagnosis not present

## 2024-02-13 DIAGNOSIS — Z0000 Encounter for general adult medical examination without abnormal findings: Secondary | ICD-10-CM | POA: Diagnosis not present

## 2024-02-13 DIAGNOSIS — R7303 Prediabetes: Secondary | ICD-10-CM | POA: Diagnosis not present

## 2024-02-13 DIAGNOSIS — G43009 Migraine without aura, not intractable, without status migrainosus: Secondary | ICD-10-CM | POA: Diagnosis not present

## 2024-02-13 DIAGNOSIS — I7300 Raynaud's syndrome without gangrene: Secondary | ICD-10-CM

## 2024-02-13 DIAGNOSIS — F419 Anxiety disorder, unspecified: Secondary | ICD-10-CM

## 2024-02-13 DIAGNOSIS — F32A Depression, unspecified: Secondary | ICD-10-CM

## 2024-02-13 DIAGNOSIS — B001 Herpesviral vesicular dermatitis: Secondary | ICD-10-CM

## 2024-02-13 DIAGNOSIS — M3500 Sicca syndrome, unspecified: Secondary | ICD-10-CM

## 2024-02-13 DIAGNOSIS — F331 Major depressive disorder, recurrent, moderate: Secondary | ICD-10-CM | POA: Diagnosis not present

## 2024-02-13 DIAGNOSIS — M797 Fibromyalgia: Secondary | ICD-10-CM | POA: Diagnosis not present

## 2024-02-13 LAB — POCT GLYCOSYLATED HEMOGLOBIN (HGB A1C): Hemoglobin A1C: 5.2 % (ref 4.0–5.6)

## 2024-02-13 MED ORDER — VALACYCLOVIR HCL 1 G PO TABS: ORAL_TABLET | ORAL | 0 refills | Status: DC

## 2024-02-13 NOTE — Assessment & Plan Note (Signed)
Following with rheumatology.  Continue duloxetine 30 mg daily.

## 2024-02-13 NOTE — Assessment & Plan Note (Signed)
 Follow-up with rheumatology. Office visit labs reviewed from February 2025.

## 2024-02-13 NOTE — Assessment & Plan Note (Signed)
 Improved.  Diagnosed with IBS.  No concerns today.

## 2024-02-13 NOTE — Assessment & Plan Note (Signed)
 Following with rheumatology. Office notes reviewed from February 2025.

## 2024-02-13 NOTE — Assessment & Plan Note (Signed)
Controlled.  Continue duloxetine 30 mg daily.  

## 2024-02-13 NOTE — Assessment & Plan Note (Signed)
 One outbreak since December 2024.  Continue Valtrex  2000 mg BID X 1 day. She will update if symptoms return on a frequent basis.

## 2024-02-13 NOTE — Assessment & Plan Note (Addendum)
 Improved with cervical spine injection!  Continue Maxalt  10 mg PRN, tizanidine  4 mg PRN. Remain off Aimovig.

## 2024-02-13 NOTE — Assessment & Plan Note (Signed)
Immunizations UTD. Pap smear due, she will schedule with GYN  Discussed the importance of a healthy diet and regular exercise in order for weight loss, and to reduce the risk of further co-morbidity.  Exam stable. Labs pending.  Follow up in 1 year for repeat physical.

## 2024-02-13 NOTE — Assessment & Plan Note (Signed)
 Improved with A1C of 5.2 today!!!  Commended her on weight loss, encouraged to continue!

## 2024-02-13 NOTE — Patient Instructions (Signed)
 It was a pleasure to see you today!

## 2024-02-13 NOTE — Assessment & Plan Note (Signed)
 Well controlled, borderline low today. Asymptomatic.   Commended her on weight loss!  Consider dose reduction of valsartan  to 40 mg daily, especially if she becomes dizzy and loses additional weight. She will update.
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
## 2024-03-06 DIAGNOSIS — Z419 Encounter for procedure for purposes other than remedying health state, unspecified: Secondary | ICD-10-CM | POA: Diagnosis not present
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
## 2024-04-05 DIAGNOSIS — Z419 Encounter for procedure for purposes other than remedying health state, unspecified: Secondary | ICD-10-CM | POA: Diagnosis not present

## 2024-04-10 ENCOUNTER — Ambulatory Visit: Payer: Medicaid Other | Attending: Rheumatology | Admitting: Rheumatology

## 2024-04-10 ENCOUNTER — Encounter: Payer: Self-pay | Admitting: Rheumatology

## 2024-04-10 VITALS — BP 112/79 | HR 82 | Resp 14 | Ht 65.0 in | Wt 177.0 lb

## 2024-04-10 DIAGNOSIS — F32A Depression, unspecified: Secondary | ICD-10-CM | POA: Insufficient documentation

## 2024-04-10 DIAGNOSIS — I7300 Raynaud's syndrome without gangrene: Secondary | ICD-10-CM | POA: Diagnosis not present

## 2024-04-10 DIAGNOSIS — M3501 Sicca syndrome with keratoconjunctivitis: Secondary | ICD-10-CM | POA: Insufficient documentation

## 2024-04-10 DIAGNOSIS — R5383 Other fatigue: Secondary | ICD-10-CM | POA: Diagnosis not present

## 2024-04-10 DIAGNOSIS — Z8719 Personal history of other diseases of the digestive system: Secondary | ICD-10-CM

## 2024-04-10 DIAGNOSIS — Z8669 Personal history of other diseases of the nervous system and sense organs: Secondary | ICD-10-CM | POA: Insufficient documentation

## 2024-04-10 DIAGNOSIS — R21 Rash and other nonspecific skin eruption: Secondary | ICD-10-CM | POA: Diagnosis present

## 2024-04-10 DIAGNOSIS — B001 Herpesviral vesicular dermatitis: Secondary | ICD-10-CM | POA: Diagnosis not present

## 2024-04-10 DIAGNOSIS — N946 Dysmenorrhea, unspecified: Secondary | ICD-10-CM | POA: Diagnosis not present

## 2024-04-10 DIAGNOSIS — F419 Anxiety disorder, unspecified: Secondary | ICD-10-CM | POA: Diagnosis not present

## 2024-04-10 DIAGNOSIS — E559 Vitamin D deficiency, unspecified: Secondary | ICD-10-CM

## 2024-04-10 DIAGNOSIS — R7303 Prediabetes: Secondary | ICD-10-CM | POA: Diagnosis not present

## 2024-04-10 DIAGNOSIS — M797 Fibromyalgia: Secondary | ICD-10-CM

## 2024-04-10 DIAGNOSIS — Z79899 Other long term (current) drug therapy: Secondary | ICD-10-CM | POA: Diagnosis not present

## 2024-04-10 MED ORDER — HYDROXYCHLOROQUINE SULFATE 200 MG PO TABS: 200.0000 mg | ORAL_TABLET | Freq: Two times a day (BID) | ORAL | 0 refills | Status: AC

## 2024-04-10 NOTE — Patient Instructions (Signed)
 Hydroxychloroquine  Tablets What is this medication? HYDROXYCHLOROQUINE  (hye drox ee KLOR oh kwin) treats autoimmune conditions, such as rheumatoid arthritis and lupus. It works by slowing down an overactive immune system. It may also be used to prevent and treat malaria. It works by killing the parasite that causes malaria. It belongs to a group of medications called DMARDs. This medicine may be used for other purposes; ask your health care provider or pharmacist if you have questions. COMMON BRAND NAME(S): Plaquenil , Quineprox, SOVUNA  What should I tell my care team before I take this medication? They need to know if you have any of these conditions: Diabetes Eye disease, vision problems Frequently drink alcohol G6PD deficiency Heart disease Irregular heartbeat or rhythm Kidney disease Liver disease Porphyria Psoriasis An unusual or allergic reaction to hydroxychloroquine , other medications, foods, dyes, or preservatives Pregnant or trying to get pregnant Breastfeeding How should I use this medication? Take this medication by mouth with water. Take it as directed on the prescription label. Do not cut, crush, or chew this medication. Swallow the tablets whole. Take it with food. Do not take it more than directed. Take all of this medication unless your care team tells you to stop it early. Keep taking it even if you think you are better. Take products with antacids in them at a different time of day than this medication. Take this medication 4 hours before or 4 hours after antacids. Talk to your care team if you have questions. Talk to your care team about the use of this medication in children. While this medication may be prescribed for selected conditions, precautions do apply. Overdosage: If you think you have taken too much of this medicine contact a poison control center or emergency room at once. NOTE: This medicine is only for you. Do not share this medicine with others. What if I  miss a dose? If you miss a dose, take it as soon as you can. If it is almost time for your next dose, take only that dose. Do not take double or extra doses. What may interact with this medication? Do not take this medication with any of the following: Cisapride Dronedarone Pimozide Thioridazine This medication may also interact with the following: Ampicillin Antacids Cimetidine Cyclosporine Digoxin Kaolin Medications for diabetes, such as insulin, glipizide, glyburide Medications for seizures, such as carbamazepine, phenobarbital, phenytoin Mefloquine Methotrexate Other medications that cause heart rhythm changes Praziquantel This list may not describe all possible interactions. Give your health care provider a list of all the medicines, herbs, non-prescription drugs, or dietary supplements you use. Also tell them if you smoke, drink alcohol, or use illegal drugs. Some items may interact with your medicine. What should I watch for while using this medication? Visit your care team for regular checks on your progress. Tell your care team if your symptoms do not start to get better or if they get worse. You may need blood work done while you are taking this medication. If you take other medications that can affect heart rhythm, you may need more testing. Talk to your care team if you have questions. Your vision may be tested before and during use of this medication. Tell your care team right away if you have any change in your eyesight. This medication may cause serious skin reactions. They can happen weeks to months after starting the medication. Contact your care team right away if you notice fevers or flu-like symptoms with a rash. The rash may be red or purple and then  turn into blisters or peeling of the skin. Or, you might notice a red rash with swelling of the face, lips or lymph nodes in your neck or under your arms. If you or your family notice any changes in your behavior, such as  new or worsening depression, thoughts of harming yourself, anxiety, or other unusual or disturbing thoughts, or memory loss, call your care team right away. What side effects may I notice from receiving this medication? Side effects that you should report to your care team as soon as possible: Allergic reactions--skin rash, itching, hives, swelling of the face, lips, tongue, or throat Aplastic anemia--unusual weakness or fatigue, dizziness, headache, trouble breathing, increased bleeding or bruising Change in vision Heart rhythm changes--fast or irregular heartbeat, dizziness, feeling faint or lightheaded, chest pain, trouble breathing Infection--fever, chills, cough, or sore throat Low blood sugar (hypoglycemia)--tremors or shaking, anxiety, sweating, cold or clammy skin, confusion, dizziness, rapid heartbeat Muscle injury--unusual weakness or fatigue, muscle pain, dark yellow or brown urine, decrease in amount of urine Pain, tingling, or numbness in the hands or feet Rash, fever, and swollen lymph nodes Redness, blistering, peeling, or loosening of the skin, including inside the mouth Thoughts of suicide or self-harm, worsening mood, or feelings of depression Unusual bruising or bleeding Side effects that usually do not require medical attention (report to your care team if they continue or are bothersome): Diarrhea Headache Nausea Stomach pain Vomiting This list may not describe all possible side effects. Call your doctor for medical advice about side effects. You may report side effects to FDA at 1-800-FDA-1088. Where should I keep my medication? Keep out of the reach of children and pets. Store at room temperature up to 30 degrees C (86 degrees F). Protect from light. Get rid of any unused medication after the expiration date. To get rid of medications that are no longer needed or have expired: Take the medication to a medication take-back program. Check with your pharmacy or law  enforcement to find a location. If you cannot return the medication, check the label or package insert to see if the medication should be thrown out in the garbage or flushed down the toilet. If you are not sure, ask your care team. If it is safe to put it in the trash, empty the medication out of the container. Mix the medication with cat litter, dirt, coffee grounds, or other unwanted substance. Seal the mixture in a bag or container. Put it in the trash. NOTE: This sheet is a summary. It may not cover all possible information. If you have questions about this medicine, talk to your doctor, pharmacist, or health care provider.  2024 Elsevier/Gold Standard (2022-03-20 00:00:00)  Standing Labs We placed an order today for your standing lab work.   Please have your standing labs drawn in 1 month after starting Plaquenil , 3 months and then every 5 months  Please have your labs drawn 2 weeks prior to your appointment so that the provider can discuss your lab results at your appointment, if possible.  Please note that you may see your imaging and lab results in MyChart before we have reviewed them. We will contact you once all results are reviewed. Please allow our office up to 72 hours to thoroughly review all of the results before contacting the office for clarification of your results.  WALK-IN LAB HOURS  Monday through Thursday from 8:00 am -12:30 pm and 1:00 pm-4:30 pm and Friday from 8:00 am-12:00 pm.  Patients with office visits  requiring labs will be seen before walk-in labs.  You may encounter longer than normal wait times. Please allow additional time. Wait times may be shorter on  Monday and Thursday afternoons.  We do not book appointments for walk-in labs. We appreciate your patience and understanding with our staff.   Labs are drawn by [facility]. Please bring your co-pay at the time of your lab draw.  You may receive a bill from [facility] for your lab work.  Please note if you are on  Hydroxychloroquine  and and an order has been placed for a Hydroxychloroquine  level,  you will need to have it drawn 4 hours or more after your last dose.  If you wish to have your labs drawn at another location, please call the office 24 hours in advance so we can fax the orders.  The office is located at [address], [place], [place] [address]   If you have any questions regarding directions or hours of operation,  please call [phone].   As a reminder, please drink plenty of water prior to coming for your lab work. Thanks!   Please get a baseline eye examination and then annual eye examination by an ophthalmologist.  Vaccines You are taking a medication(s) that can suppress your immune system.  The following immunizations are recommended: Flu annually Covid-19  Td/Tdap (tetanus, diphtheria, pertussis) every 10 years Pneumonia (Prevnar 15 then Pneumovax 23 at least 1 year apart.  Alternatively, can take Prevnar 20 without needing additional dose) Shingrix: 2 doses from 4 weeks to 6 months apart  Please check with your PCP to make sure you are up to date.    Please use sunscreen and sun protection.

## 2024-04-15 LAB — CBC WITH DIFFERENTIAL/PLATELET
Absolute Lymphocytes: 2498 {cells}/uL (ref 850–3900)
Absolute Monocytes: 302 {cells}/uL (ref 200–950)
Basophils Absolute: 39 {cells}/uL (ref 0–200)
Basophils Relative: 0.7 %
Eosinophils Absolute: 39 {cells}/uL (ref 15–500)
Eosinophils Relative: 0.7 %
HCT: 39 % (ref 35.0–45.0)
Hemoglobin: 12.2 g/dL (ref 11.7–15.5)
MCH: 26.7 pg — ABNORMAL LOW (ref 27.0–33.0)
MCHC: 31.3 g/dL — ABNORMAL LOW (ref 32.0–36.0)
MCV: 85.3 fL (ref 80.0–100.0)
MPV: 9.9 fL (ref 7.5–12.5)
Monocytes Relative: 5.4 %
Neutro Abs: 2722 {cells}/uL (ref 1500–7800)
Neutrophils Relative %: 48.6 %
Platelets: 346 Thousand/uL (ref 140–400)
RBC: 4.57 Million/uL (ref 3.80–5.10)
RDW: 15.7 % — ABNORMAL HIGH (ref 11.0–15.0)
Total Lymphocyte: 44.6 %
WBC: 5.6 Thousand/uL (ref 3.8–10.8)

## 2024-04-15 LAB — C3 AND C4
C3 Complement: 144 mg/dL (ref 83–193)
C4 Complement: 25 mg/dL (ref 15–57)

## 2024-04-15 LAB — PROTEIN / CREATININE RATIO, URINE
Creatinine, Urine: 183 mg/dL (ref 20–275)
Protein/Creat Ratio: 60 mg/g{creat} (ref 24–184)
Protein/Creatinine Ratio: 0.06 mg/mg{creat} (ref 0.024–0.184)
Total Protein, Urine: 11 mg/dL (ref 5–24)

## 2024-04-15 LAB — COMPREHENSIVE METABOLIC PANEL WITH GFR
AG Ratio: 2.1 (calc) (ref 1.0–2.5)
ALT: 7 U/L (ref 6–29)
AST: 16 U/L (ref 10–30)
Albumin: 4.4 g/dL (ref 3.6–5.1)
Alkaline phosphatase (APISO): 54 U/L (ref 31–125)
BUN: 8 mg/dL (ref 7–25)
CO2: 27 mmol/L (ref 20–32)
Calcium: 9.3 mg/dL (ref 8.6–10.2)
Chloride: 104 mmol/L (ref 98–110)
Creat: 0.83 mg/dL (ref 0.50–0.97)
Globulin: 2.1 g/dL (ref 1.9–3.7)
Glucose, Bld: 84 mg/dL (ref 65–99)
Potassium: 4.5 mmol/L (ref 3.5–5.3)
Sodium: 139 mmol/L (ref 135–146)
Total Bilirubin: 0.6 mg/dL (ref 0.2–1.2)
Total Protein: 6.5 g/dL (ref 6.1–8.1)
eGFR: 94 mL/min/1.73m2 (ref >=60)

## 2024-04-15 LAB — ANTI-DNA ANTIBODY, DOUBLE-STRANDED: ds DNA Ab: 1 [IU]/mL

## 2024-04-15 LAB — SJOGRENS SYNDROME-A EXTRACTABLE NUCLEAR ANTIBODY: SSA (Ro) (ENA) Antibody, IgG: 5.6 AI — AB

## 2024-04-15 LAB — VITAMIN D 25 HYDROXY (VIT D DEFICIENCY, FRACTURES): Vit D, 25-Hydroxy: 46 ng/mL (ref 30–100)

## 2024-04-15 LAB — ANTI-NUCLEAR AB-TITER (ANA TITER): ANA Titer 1: 1:640 {titer} — ABNORMAL HIGH

## 2024-04-15 LAB — SEDIMENTATION RATE: Sed Rate: 6 mm/h (ref 0–20)

## 2024-04-15 LAB — ANA: Anti Nuclear Antibody (ANA): POSITIVE — AB

## 2024-04-16 ENCOUNTER — Ambulatory Visit: Payer: Self-pay | Admitting: Rheumatology

## 2024-04-16 NOTE — Progress Notes (Signed)
 CBC normal, CMP normal, ANA remains positive, dsDNA negative, SSA remains positive, urine protein creatinine ratio normal, sed rate normal, complements normal, vitamin D  normal.  Labs do not indicate an autoimmune disease flare.  No change in treatment advised.

## 2024-04-29 ENCOUNTER — Other Ambulatory Visit: Payer: Self-pay

## 2024-04-29 DIAGNOSIS — B001 Herpesviral vesicular dermatitis: Secondary | ICD-10-CM

## 2024-04-29 DIAGNOSIS — I10 Essential (primary) hypertension: Secondary | ICD-10-CM

## 2024-04-29 MED ORDER — VALSARTAN 80 MG PO TABS: 80.0000 mg | ORAL_TABLET | Freq: Every day | ORAL | 2 refills | Status: AC

## 2024-04-29 MED ORDER — VALACYCLOVIR HCL 1 G PO TABS: ORAL_TABLET | ORAL | 0 refills | Status: AC

## 2024-05-06 DIAGNOSIS — Z419 Encounter for procedure for purposes other than remedying health state, unspecified: Secondary | ICD-10-CM | POA: Diagnosis not present

## 2024-05-20 ENCOUNTER — Other Ambulatory Visit: Payer: Self-pay | Admitting: Primary Care

## 2024-05-20 DIAGNOSIS — M797 Fibromyalgia: Secondary | ICD-10-CM

## 2024-05-20 DIAGNOSIS — F32A Depression, unspecified: Secondary | ICD-10-CM

## 2024-05-28 ENCOUNTER — Other Ambulatory Visit: Payer: Self-pay

## 2024-05-28 MED ORDER — OMEPRAZOLE 20 MG PO CPDR: 20.0000 mg | DELAYED_RELEASE_CAPSULE | Freq: Every day | ORAL | 2 refills | Status: DC

## 2024-06-17 ENCOUNTER — Other Ambulatory Visit: Payer: Self-pay

## 2024-06-17 DIAGNOSIS — G43909 Migraine, unspecified, not intractable, without status migrainosus: Secondary | ICD-10-CM

## 2024-06-17 DIAGNOSIS — G43009 Migraine without aura, not intractable, without status migrainosus: Secondary | ICD-10-CM

## 2024-06-17 MED ORDER — TIZANIDINE HCL 4 MG PO TABS: 4.0000 mg | ORAL_TABLET | Freq: Every day | ORAL | 0 refills | Status: AC | PRN

## 2024-06-17 MED ORDER — ONDANSETRON 4 MG PO TBDP: 4.0000 mg | ORAL_TABLET | Freq: Three times a day (TID) | ORAL | 0 refills | Status: AC | PRN
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
## 2024-07-10 ENCOUNTER — Ambulatory Visit: Payer: Self-pay | Admitting: Primary Care

## 2024-07-14 ENCOUNTER — Ambulatory Visit: Admitting: Physician Assistant

## 2024-07-14 DIAGNOSIS — R5383 Other fatigue: Secondary | ICD-10-CM

## 2024-07-14 DIAGNOSIS — E559 Vitamin D deficiency, unspecified: Secondary | ICD-10-CM

## 2024-07-14 DIAGNOSIS — R7303 Prediabetes: Secondary | ICD-10-CM

## 2024-07-14 DIAGNOSIS — Z79899 Other long term (current) drug therapy: Secondary | ICD-10-CM

## 2024-07-14 DIAGNOSIS — M797 Fibromyalgia: Secondary | ICD-10-CM

## 2024-07-14 DIAGNOSIS — Z8719 Personal history of other diseases of the digestive system: Secondary | ICD-10-CM

## 2024-07-14 DIAGNOSIS — M3501 Sicca syndrome with keratoconjunctivitis: Secondary | ICD-10-CM

## 2024-07-14 DIAGNOSIS — I7300 Raynaud's syndrome without gangrene: Secondary | ICD-10-CM

## 2024-07-14 DIAGNOSIS — F32A Depression, unspecified: Secondary | ICD-10-CM

## 2024-07-14 DIAGNOSIS — N946 Dysmenorrhea, unspecified: Secondary | ICD-10-CM

## 2024-07-14 DIAGNOSIS — Z8669 Personal history of other diseases of the nervous system and sense organs: Secondary | ICD-10-CM

## 2024-09-01 ENCOUNTER — Other Ambulatory Visit: Payer: Self-pay | Admitting: Gastroenterology

## 2024-10-20 LAB — LAB REPORT - SCANNED
HM HIV Screening: NEGATIVE
HM Hepatitis Screen: NEGATIVE
